# Patient Record
Sex: Female | Born: 1965 | Marital: Married | Smoking: Never smoker
Health system: Southern US, Community
[De-identification: ages and names within clinical notes are randomized; demographics above are authoritative.]

## PROBLEM LIST (undated history)

## (undated) DIAGNOSIS — E119 Type 2 diabetes mellitus without complications: Secondary | ICD-10-CM

## (undated) DIAGNOSIS — I639 Cerebral infarction, unspecified: Secondary | ICD-10-CM

## (undated) DIAGNOSIS — I10 Essential (primary) hypertension: Secondary | ICD-10-CM

## (undated) HISTORY — PX: NO PAST SURGERIES: SHX2092

## (undated) HISTORY — PX: APPENDECTOMY: SHX54

---

## 2014-01-27 ENCOUNTER — Emergency Department: Payer: Self-pay | Admitting: Emergency Medicine

## 2014-01-27 LAB — URINALYSIS, COMPLETE
Bilirubin,UR: NEGATIVE
Glucose,UR: >=500 mg/dL (ref 0–75)
Ketone: NEGATIVE
Nitrite: NEGATIVE
Ph: 6 (ref 4.5–8.0)
Protein: 30
RBC,UR: 54 /HPF (ref 0–5)
Specific Gravity: 1.033 (ref 1.003–1.030)
Squamous Epithelial: NONE SEEN
WBC UR: 576 /HPF (ref 0–5)

## 2014-01-27 LAB — CBC WITH DIFFERENTIAL/PLATELET
Basophil #: 0.1 10*3/uL (ref 0.0–0.1)
Basophil %: 0.6 %
Eosinophil #: 0.4 10*3/uL (ref 0.0–0.7)
Eosinophil %: 3.9 %
HCT: 39.3 % (ref 35.0–47.0)
HGB: 13.3 g/dL (ref 12.0–16.0)
Lymphocyte #: 3.8 10*3/uL — ABNORMAL HIGH (ref 1.0–3.6)
Lymphocyte %: 41.7 %
MCH: 30.1 pg (ref 26.0–34.0)
MCHC: 33.8 g/dL (ref 32.0–36.0)
MCV: 89 fL (ref 80–100)
Monocyte #: 0.9 x10 3/mm (ref 0.2–0.9)
Monocyte %: 9.6 %
Neutrophil #: 4.1 10*3/uL (ref 1.4–6.5)
Neutrophil %: 44.2 %
Platelet: 283 10*3/uL (ref 150–440)
RBC: 4.41 10*6/uL (ref 3.80–5.20)
RDW: 12.2 % (ref 11.5–14.5)
WBC: 9.2 10*3/uL (ref 3.6–11.0)

## 2014-01-27 LAB — COMPREHENSIVE METABOLIC PANEL
Albumin: 3.7 g/dL (ref 3.4–5.0)
Alkaline Phosphatase: 140 U/L — ABNORMAL HIGH
Anion Gap: 8 (ref 7–16)
BUN: 18 mg/dL (ref 7–18)
Bilirubin,Total: 0.2 mg/dL (ref 0.2–1.0)
Calcium, Total: 8.4 mg/dL — ABNORMAL LOW (ref 8.5–10.1)
Chloride: 102 mmol/L (ref 98–107)
Co2: 27 mmol/L (ref 21–32)
Creatinine: 0.93 mg/dL (ref 0.60–1.30)
EGFR (African American): >60
EGFR (Non-African Amer.): >60
Glucose: 401 mg/dL — ABNORMAL HIGH (ref 65–99)
Osmolality: 293 (ref 275–301)
Potassium: 4.1 mmol/L (ref 3.5–5.1)
SGOT(AST): 14 U/L — ABNORMAL LOW (ref 15–37)
SGPT (ALT): 27 U/L
Sodium: 137 mmol/L (ref 136–145)
Total Protein: 8.1 g/dL (ref 6.4–8.2)

## 2020-05-18 ENCOUNTER — Other Ambulatory Visit: Payer: Self-pay | Admitting: Internal Medicine

## 2020-05-18 DIAGNOSIS — Z1231 Encounter for screening mammogram for malignant neoplasm of breast: Secondary | ICD-10-CM

## 2020-05-31 ENCOUNTER — Inpatient Hospital Stay: Admission: RE | Admit: 2020-05-31 | Payer: Self-pay | Source: Ambulatory Visit

## 2020-06-15 ENCOUNTER — Observation Stay
Admission: EM | Admit: 2020-06-15 | Discharge: 2020-06-17 | Disposition: A | Discharge status: Home / Self Care | Payer: Medicaid Other | Attending: Internal Medicine | Admitting: Internal Medicine

## 2020-06-15 ENCOUNTER — Observation Stay: Payer: Medicaid Other

## 2020-06-15 ENCOUNTER — Emergency Department: Payer: Medicaid Other

## 2020-06-15 ENCOUNTER — Other Ambulatory Visit: Payer: Self-pay

## 2020-06-15 ENCOUNTER — Encounter: Payer: Self-pay | Admitting: Emergency Medicine

## 2020-06-15 DIAGNOSIS — I152 Hypertension secondary to endocrine disorders: Secondary | ICD-10-CM | POA: Diagnosis present

## 2020-06-15 DIAGNOSIS — I10 Essential (primary) hypertension: Secondary | ICD-10-CM | POA: Diagnosis present

## 2020-06-15 DIAGNOSIS — E119 Type 2 diabetes mellitus without complications: Secondary | ICD-10-CM | POA: Diagnosis not present

## 2020-06-15 DIAGNOSIS — E1159 Type 2 diabetes mellitus with other circulatory complications: Secondary | ICD-10-CM | POA: Diagnosis present

## 2020-06-15 DIAGNOSIS — I639 Cerebral infarction, unspecified: Secondary | ICD-10-CM | POA: Diagnosis not present

## 2020-06-15 DIAGNOSIS — U071 COVID-19: Secondary | ICD-10-CM | POA: Diagnosis not present

## 2020-06-15 DIAGNOSIS — R200 Anesthesia of skin: Secondary | ICD-10-CM | POA: Diagnosis present

## 2020-06-15 HISTORY — DX: Type 2 diabetes mellitus without complications: E11.9

## 2020-06-15 HISTORY — DX: Essential (primary) hypertension: I10

## 2020-06-15 LAB — CBC
HCT: 34.5 % — ABNORMAL LOW (ref 36.0–46.0)
Hemoglobin: 11.4 g/dL — ABNORMAL LOW (ref 12.0–15.0)
MCH: 28.8 pg (ref 26.0–34.0)
MCHC: 33 g/dL (ref 30.0–36.0)
MCV: 87.1 fL (ref 80.0–100.0)
Platelets: 296 10*3/uL (ref 150–400)
RBC: 3.96 MIL/uL (ref 3.87–5.11)
RDW: 11.9 % (ref 11.5–15.5)
WBC: 9.8 10*3/uL (ref 4.0–10.5)
nRBC: 0 % (ref 0.0–0.2)

## 2020-06-15 LAB — DIFFERENTIAL
Abs Immature Granulocytes: 0.03 10*3/uL (ref 0.00–0.07)
Basophils Absolute: 0 10*3/uL (ref 0.0–0.1)
Basophils Relative: 0 %
Eosinophils Absolute: 0.2 10*3/uL (ref 0.0–0.5)
Eosinophils Relative: 2 %
Immature Granulocytes: 0 %
Lymphocytes Relative: 35 %
Lymphs Abs: 3.4 10*3/uL (ref 0.7–4.0)
Monocytes Absolute: 0.8 10*3/uL (ref 0.1–1.0)
Monocytes Relative: 8 %
Neutro Abs: 5.4 10*3/uL (ref 1.7–7.7)
Neutrophils Relative %: 55 %

## 2020-06-15 LAB — TROPONIN I (HIGH SENSITIVITY): Troponin I (High Sensitivity): <2 ng/L (ref <18)

## 2020-06-15 LAB — COMPREHENSIVE METABOLIC PANEL
ALT: 31 U/L (ref 0–44)
AST: 23 U/L (ref 15–41)
Albumin: 3.7 g/dL (ref 3.5–5.0)
Alkaline Phosphatase: 119 U/L (ref 38–126)
Anion gap: 8 (ref 5–15)
BUN: 28 mg/dL — ABNORMAL HIGH (ref 6–20)
CO2: 26 mmol/L (ref 22–32)
Calcium: 9.1 mg/dL (ref 8.9–10.3)
Chloride: 104 mmol/L (ref 98–111)
Creatinine, Ser: 0.68 mg/dL (ref 0.44–1.00)
GFR, Estimated: >60 mL/min (ref >60)
Glucose, Bld: 193 mg/dL — ABNORMAL HIGH (ref 70–99)
Potassium: 4.2 mmol/L (ref 3.5–5.1)
Sodium: 138 mmol/L (ref 135–145)
Total Bilirubin: 0.4 mg/dL (ref 0.3–1.2)
Total Protein: 8.4 g/dL — ABNORMAL HIGH (ref 6.5–8.1)

## 2020-06-15 LAB — RESP PANEL BY RT-PCR (FLU A&B, COVID) ARPGX2
Influenza A by PCR: NEGATIVE
Influenza B by PCR: NEGATIVE
SARS Coronavirus 2 by RT PCR: POSITIVE — AB

## 2020-06-15 LAB — CBG MONITORING, ED
Glucose-Capillary: 175 mg/dL — ABNORMAL HIGH (ref 70–99)
Glucose-Capillary: 197 mg/dL — ABNORMAL HIGH (ref 70–99)

## 2020-06-15 LAB — PROTIME-INR
INR: 1 (ref 0.8–1.2)
Prothrombin Time: 13.6 seconds (ref 11.4–15.2)

## 2020-06-15 LAB — APTT: aPTT: 29 seconds (ref 24–36)

## 2020-06-15 IMAGING — CT CT HEAD W/O CM
3 series · 15 of 46 positions shown, 18 images · non-contrast
Comparison: No pertinent prior exams available for comparison.

CLINICAL DATA: Provided history: Neuro deficit, acute, stroke
suspected. Additional history provided: Left-sided numbness which
began last night.

EXAM:
CT HEAD WITHOUT CONTRAST
TECHNIQUE: Contiguous axial images were obtained from the base of the skull
through the vertex without intravenous contrast.

[Series 3: head wo · axial · 0.42mm/px · z∈[-130,-10]mm · 9 of 29 slices shown, 12 images]
[im 3/29  brain]
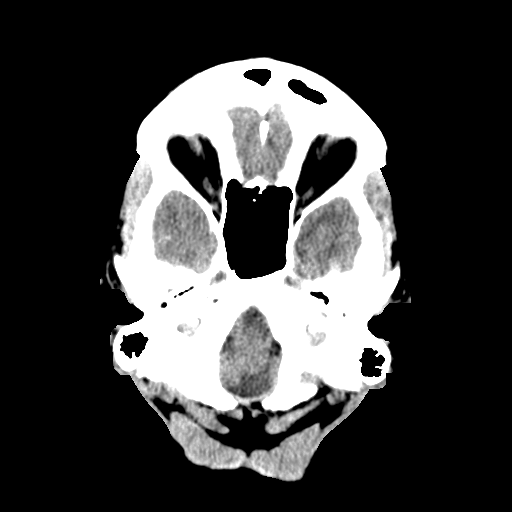
[im 3/29  bone]
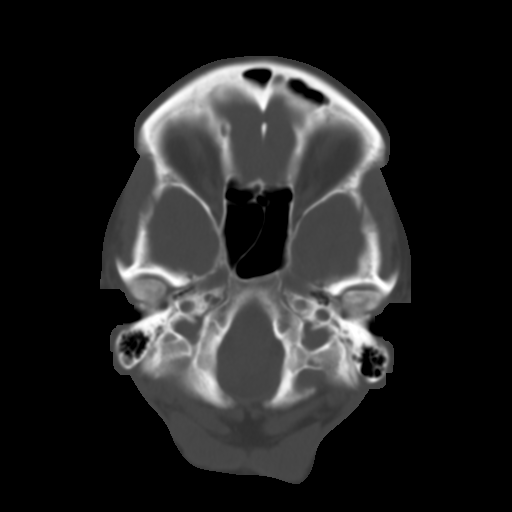
[im 6/29  brain]
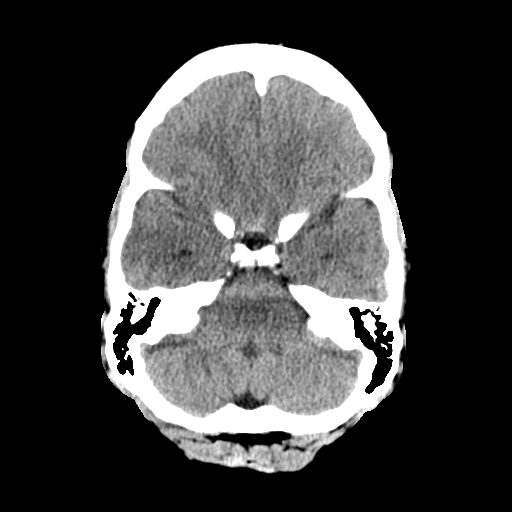
[im 9/29  brain]
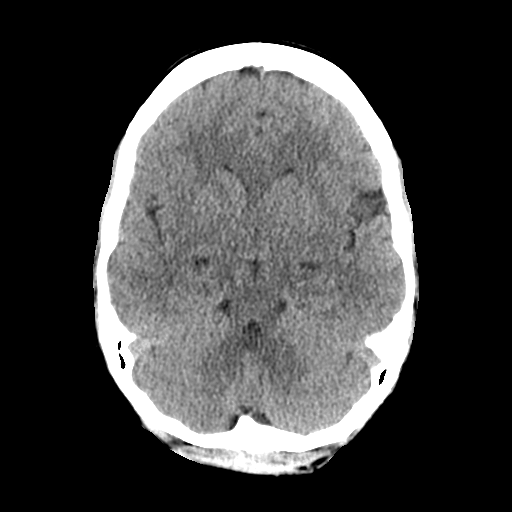
[im 12/29  brain]
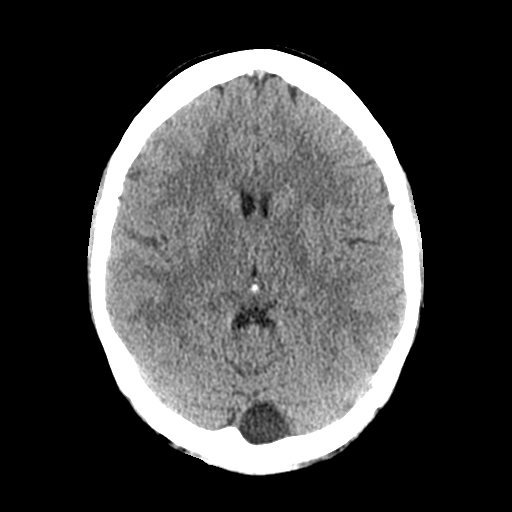
[im 15/29  brain]
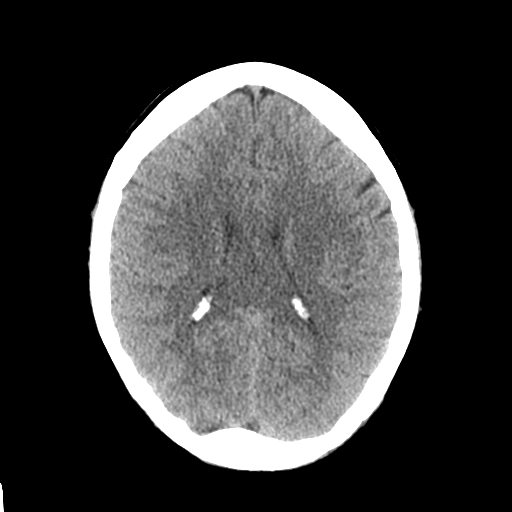
[im 15/29  bone]
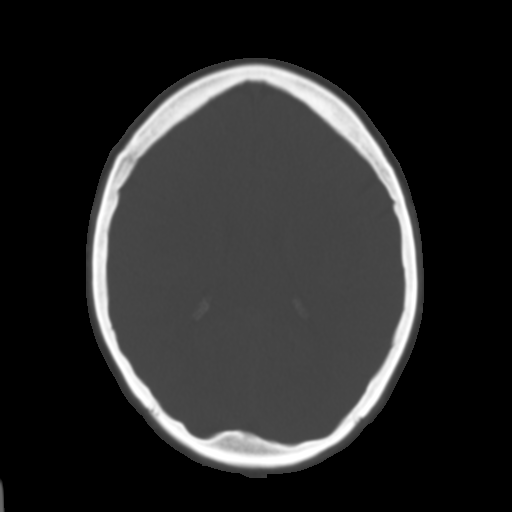
[im 18/29  brain]
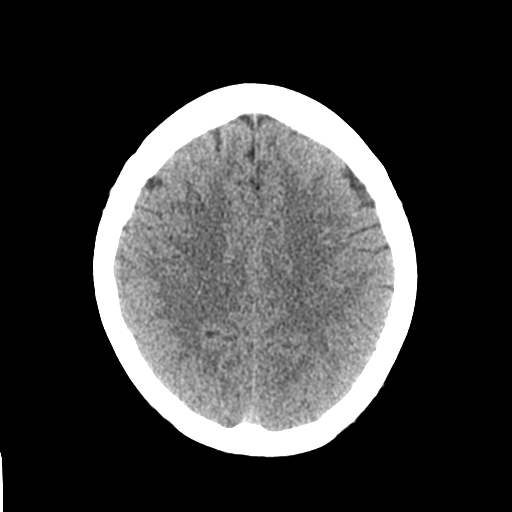
[im 21/29  brain]
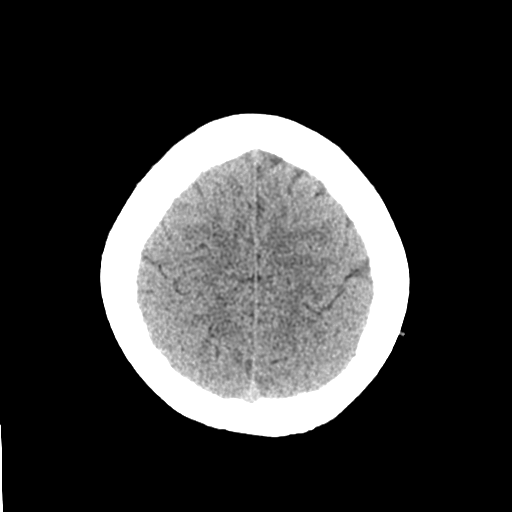
[im 24/29  brain]
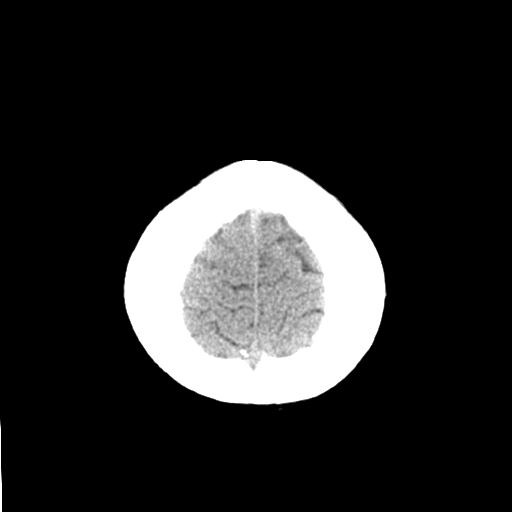
[im 27/29  brain]
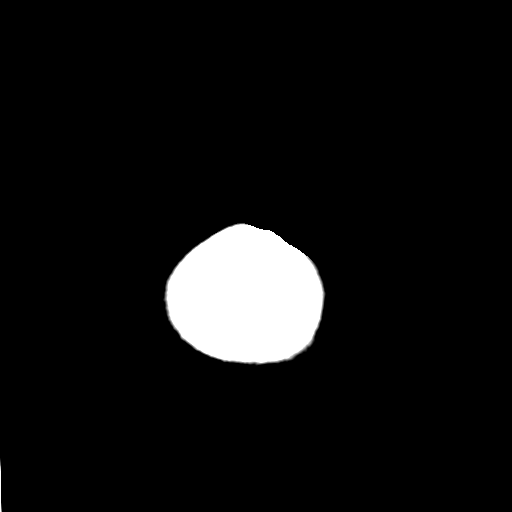
[im 27/29  bone]
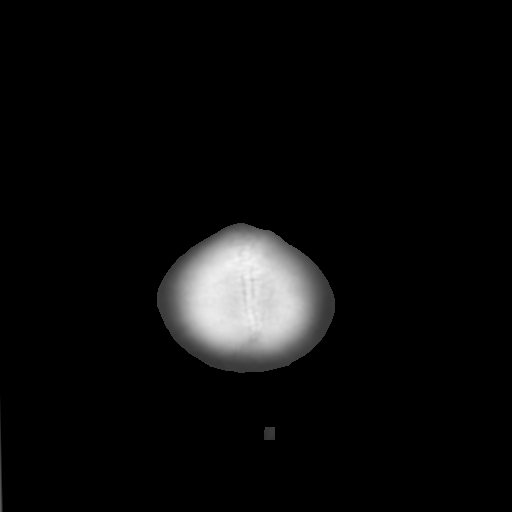

[Series 4: coronal soft tissue · coronal · 0.34mm/px · 3 of 63 slices shown]
[im 22/63  brain]
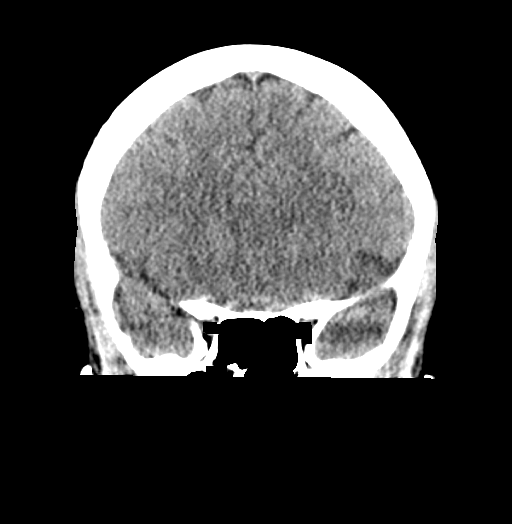
[im 28/63  brain]
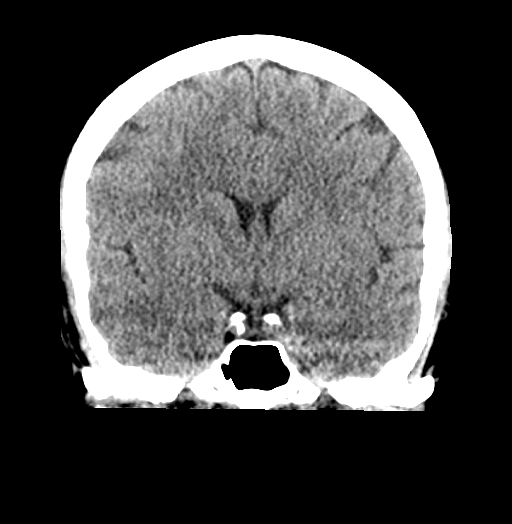
[im 35/63  brain]
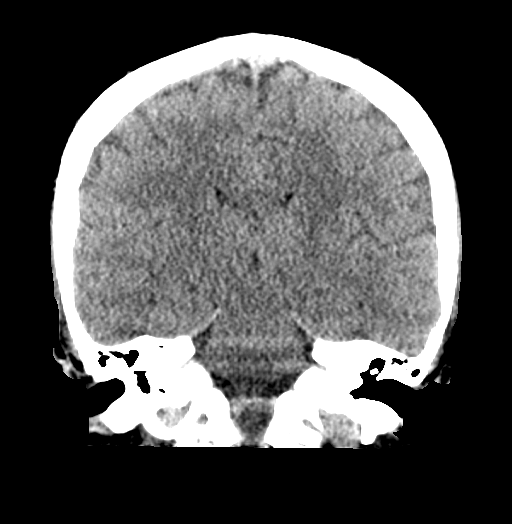

[Series 5: sagittal soft tissue · sagittal · 0.35mm/px · 3 of 53 slices shown]
[im 18/53  brain]
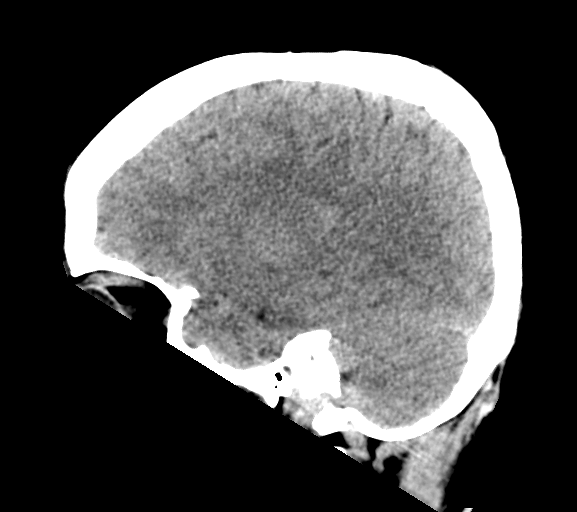
[im 27/53  brain]
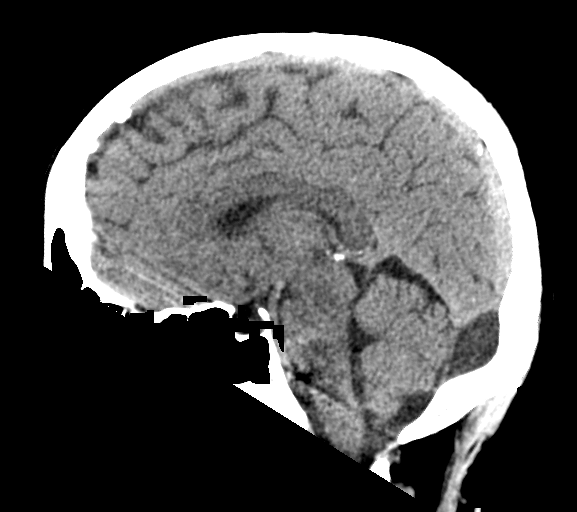
[im 35/53  brain]
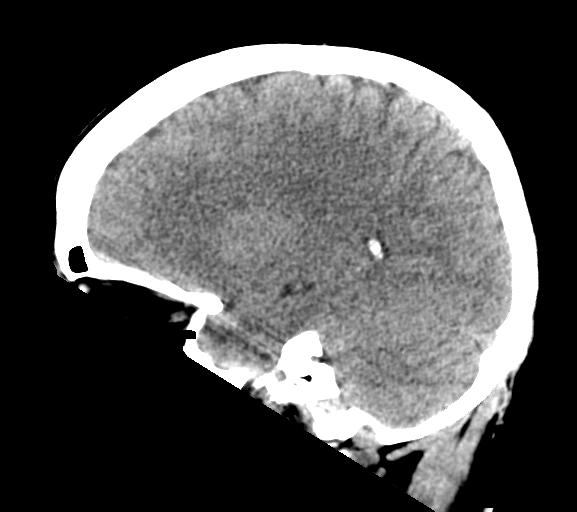

[15 of 46 positions shown; findings below may reference images not displayed]

FINDINGS: Brain:

Cerebral volume is normal.

6 mm focal hypodensity within the right frontal lobe centrum
semiovale (series 3, image 19) (series 5, image 22).

Retrocerebellar CSF density prominence just to the left of midline
measuring 2.1 x 2.4 cm in transaxial dimensions (series 3, image 13)
(series 5, image 28). This likely reflects an incidental arachnoid
cyst.

There is no acute intracranial hemorrhage.

No demarcated cortical infarct.

No extra-axial fluid collection.

No evidence of intracranial mass.

No midline shift.

Partially empty sella turcica.

Vascular: No hyperdense vessel.  Atherosclerotic calcifications.

Skull: Normal. Negative for fracture or focal lesion.

Sinuses/Orbits: Visualized orbits show no acute finding. Moderate
sized fluid level within the left frontal sinus. Scattered mild
mucosal thickening and possible fluid within the bilateral ethmoid
air cells.
IMPRESSION: 6 mm hypodensity within the right frontal lobe white matter, which
may reflect an age-indeterminate lacunar infarct or prominent
perivascular space. Given the provided history of left-sided
weakness, consider a noncontrast brain MRI for further evaluation.

Probable incidental left retrocerebellar arachnoid cyst.

Partially empty sella turcica. This finding is very commonly
incidental, but can be associated with idiopathic intracranial
hypertension.

Otherwise unremarkable non-contrast CT appearance of the brain.

Paranasal sinus disease, as described.

## 2020-06-15 IMAGING — MR MR MRA NECK WO/W CM
1 of 2 series · 20 of 48 positions shown · IV contrast (7ml Gadavist)
Comparison: Prior brain MRI from earlier the same day.

CLINICAL DATA: Initial evaluation for acute stroke.

EXAM:
MRA NECK WITHOUT AND WITH CONTRAST
MRA HEAD WITHOUT CONTRAST
TECHNIQUE: Multiplanar and multiecho pulse sequences of the neck were obtained
without and with intravenous contrast. Angiographic images of the
neck were obtained using MRA technique without and with intravenous
contrast; Angiographic images of the Circle of Willis were obtained
using MRA technique without intravenous contrast.
CONTRAST:  7mL GADAVIST GADOBUTROL 1 MMOL/ML IV SOLN

[Series 15: angio_[id number]_post_ttc=2.0s_moco-adv_sub · coronal · B · 0.9mm · 0.85mm/px · 20 of 96 slices shown]
[im 1/96]
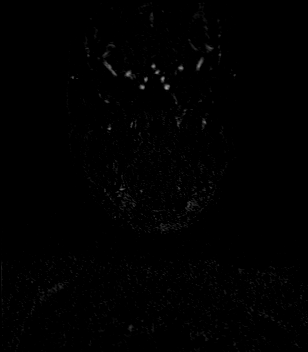
[im 6/96]
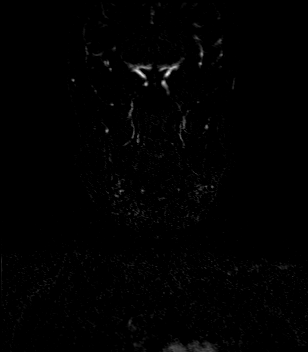
[im 11/96]
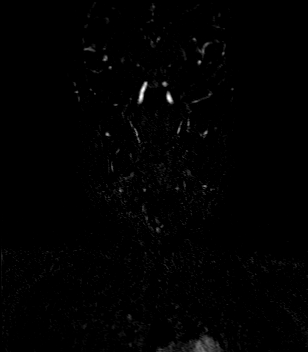
[im 16/96]
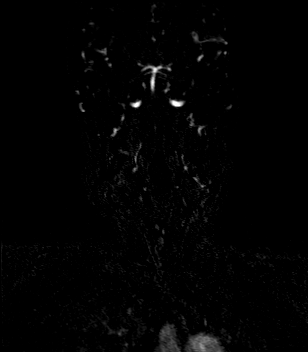
[im 21/96]
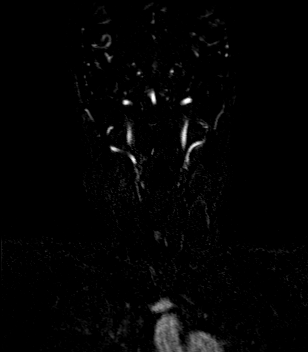
[im 26/96]
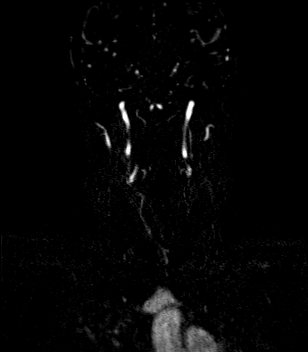
[im 31/96]
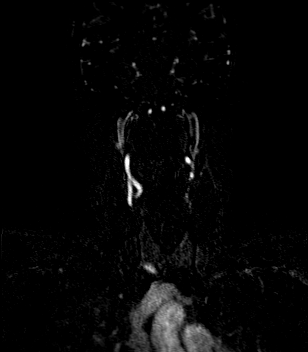
[im 36/96]
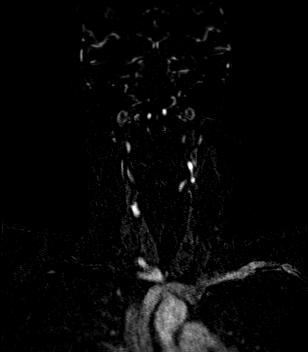
[im 41/96]
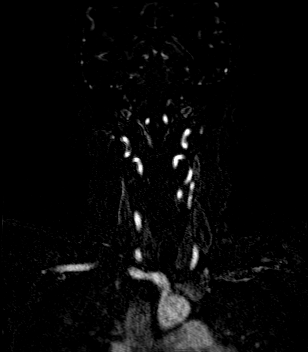
[im 46/96]
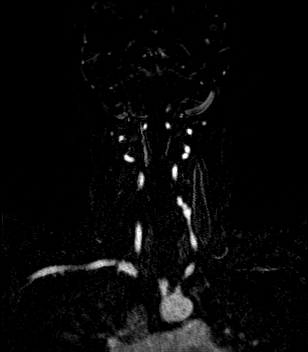
[im 51/96]
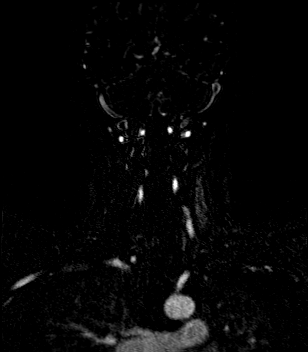
[im 56/96]
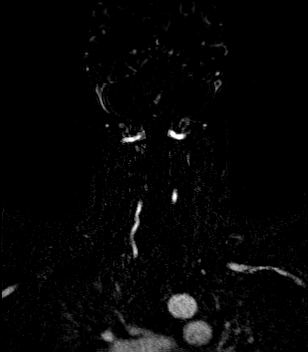
[im 61/96]
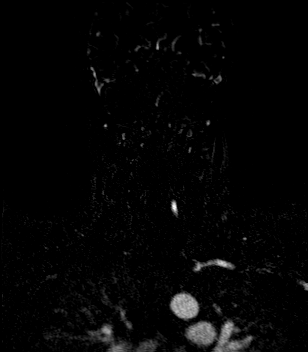
[im 66/96]
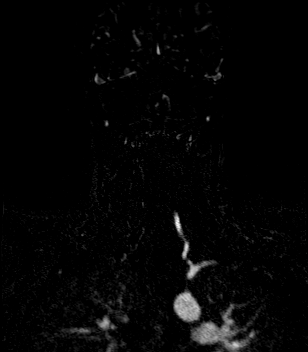
[im 71/96]
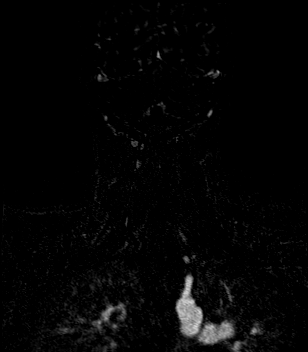
[im 76/96]
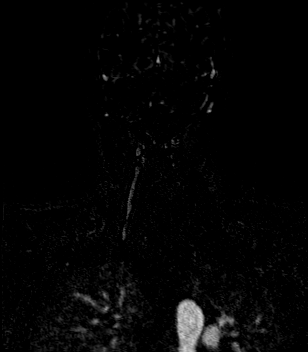
[im 81/96]
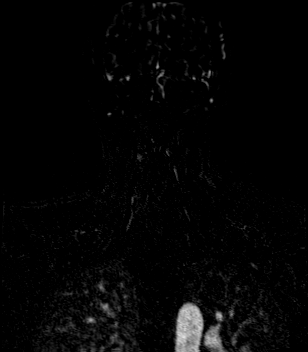
[im 86/96]
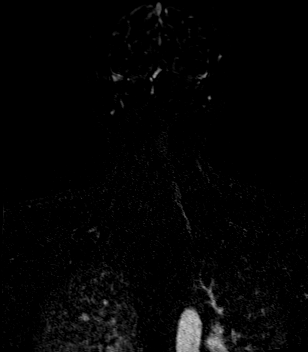
[im 91/96]
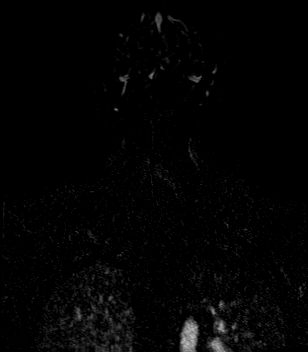
[im 96/96]
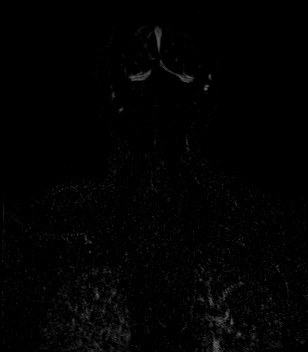

[20 of 48 positions shown; findings below may reference images not displayed]

FINDINGS: MRA NECK FINDINGS

AORTIC ARCH: Visualized aortic arch normal caliber with normal 3
vessel morphology. No hemodynamically significant stenosis seen
about the origin of the great vessels.

RIGHT CAROTID SYSTEM: Right CCA patent from its origin to the
bifurcation without stenosis. There is a severe 70-80% stenosis
involving the proximal right ICA, just distal to the bifurcation
(series [LE], image 7). Area of involvement essentially begins at
the bifurcation, and measures approximately 1 cm in length.
Distally, the right ICA is patent without stenosis, evidence for
dissection, or occlusion.

LEFT CAROTID SYSTEM: Left CCA patent from its origin to the
bifurcation without stenosis. Mild atheromatous irregularity about
the left carotid bulb/proximal left ICA without hemodynamically
significant stenosis. Left ICA patent distally without stenosis,
evidence for dissection or occlusion.

VERTEBRAL ARTERIES: Both vertebral arteries arise from the
subclavian arteries. Short-segment moderate approximate 50% stenosis
at the origin of the right vertebral artery (series [LE], image 9).
Vertebral arteries otherwise patent within the neck without
stenosis, evidence for dissection, or occlusion.

MRA HEAD FINDINGS

ANTERIOR CIRCULATION:

Examination degraded by motion artifact.

Petrous segments patent bilaterally. Diffuse atheromatous
irregularity with narrowing throughout the carotid siphons.
Associated moderate to severe stenosis at the anterior genu of the
cavernous right ICA (series [LE], image 3). Additional multifocal
severe stenoses noted about the cavernous and para clinoid left ICA
(series [LE], image 15). ICA termini well perfused. A1 segments
patent. Normal anterior communicating artery complex. Anterior
cerebral arteries patent to their distal aspects without appreciable
stenosis. No M1 stenosis or occlusion. Normal MCA bifurcations.
Distal MCA branches perfused and fairly symmetric.

POSTERIOR CIRCULATION:

Both V4 segments patent to the vertebrobasilar junction without
stenosis. Left vertebral artery dominant. Left PICA origin patent
and normal. Right PICA not seen. Irregularity within the proximal
and mid basilar artery could be related to atheromatous disease
versus artifact. Similarly, an apparent short-segment moderate
stenosis at the distal basilar artery could be related to artifact
or atherosclerotic disease (sees series [LE], image 10). Superior
cerebellar arteries patent proximally. Both PCAs primarily supplied
via the basilar. Short-segment moderate stenosis at the left P1/P2
junction (series [LE], image 10). PCAs otherwise well perfused to
their distal aspects without stenosis.

No intracranial aneurysm.
IMPRESSION: MRA HEAD IMPRESSION:

1. Motion degraded exam.
2. Atheromatous irregularity throughout the carotid siphons with
associated moderate to severe stenoses, left worse than right.
3. Short-segment moderate stenosis at the left P1/P2 junction.
4. Atheromatous irregularity versus artifact and possible
short-segment moderate stenosis at the distal basilar artery as
above.

MRA NECK IMPRESSION:

1. Severe 70-80% stenosis involving the proximal right ICA, just
distal to the bifurcation. Finding is likely the source of the acute
watershed infarcts seen on prior brain MRI. Both carotid artery
systems otherwise widely patent within the neck.
2. Short-segment moderate approximate 50% stenosis at the origin of
the right vertebral artery. Vertebral arteries otherwise patent
within the neck

## 2020-06-15 IMAGING — MR MR HEAD W/O CM
11 series · 45 of 48 positions shown · non-contrast
Comparison: No pertinent prior exams available for comparison.

CLINICAL DATA: Brain mass or lesion.

EXAM:
MRI HEAD WITHOUT CONTRAST
TECHNIQUE: Multiplanar, multiecho pulse sequences of the brain and surrounding
structures were obtained without intravenous contrast.

[Series 5: ax dwi_tracew · axial · 3.0mm · 0.65mm/px · z∈[-125,+26]mm · 4 of 48 slices shown]
[im 1/48]
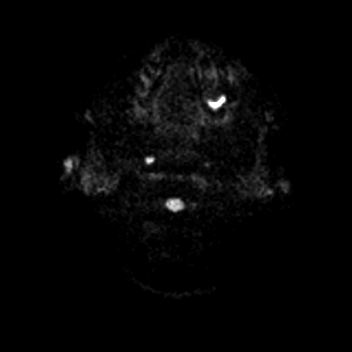
[im 16/48]
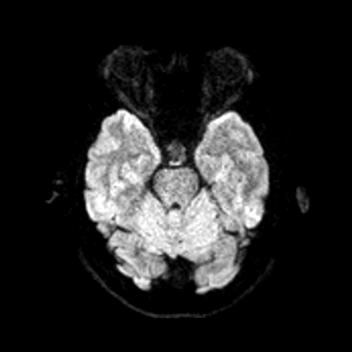
[im 32/48]
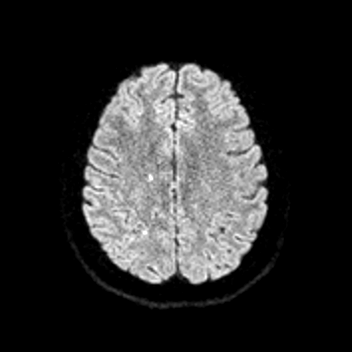
[im 48/48]
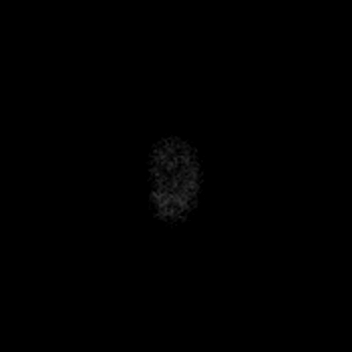

[Series 6: ax dwi_adc · axial · 3.0mm · 0.65mm/px · z∈[-125,+26]mm · 4 of 48 slices shown]
[im 1/48]
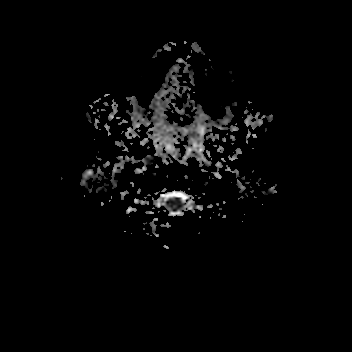
[im 16/48]
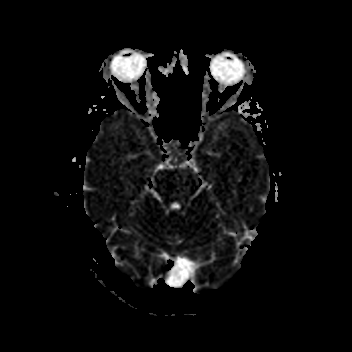
[im 32/48]
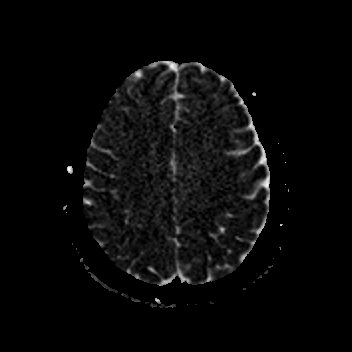
[im 48/48]
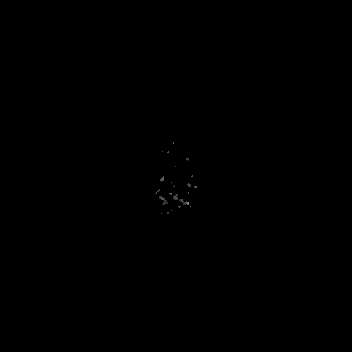

[Series 7: cor dwi_tracew · coronal · 5.0mm · 0.65mm/px · 3 of 40 slices shown]
[im 1/40]
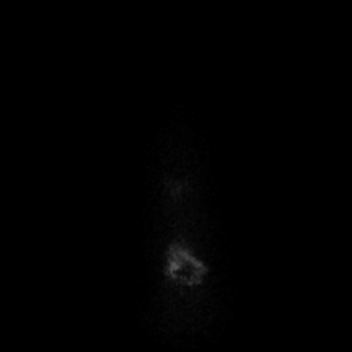
[im 20/40]
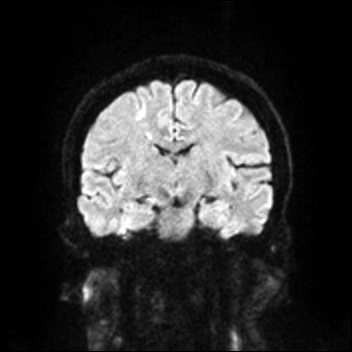
[im 40/40]
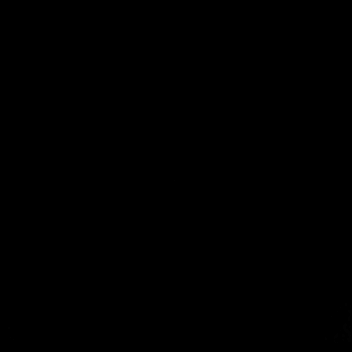

[Series 8: cor dwi_adc · coronal · 5.0mm · 0.65mm/px · 3 of 36 slices shown]
[im 1/36]
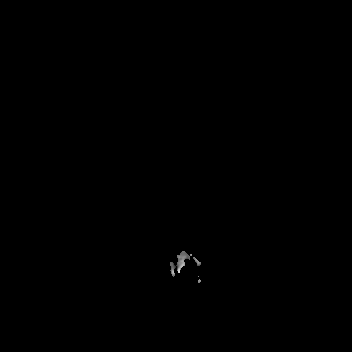
[im 18/36]
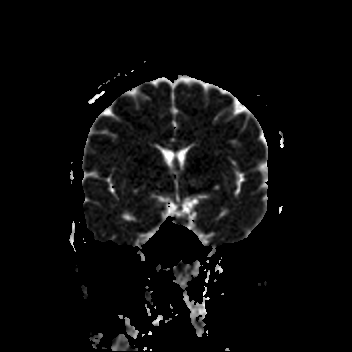
[im 36/36]
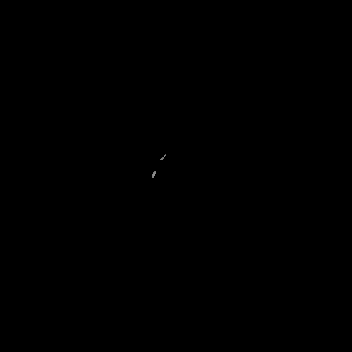

[Series 9: T1 · sagittal · 5.0mm · 0.62mm/px · 2 of 25 slices shown (1 of 2)]
[im 1/25]
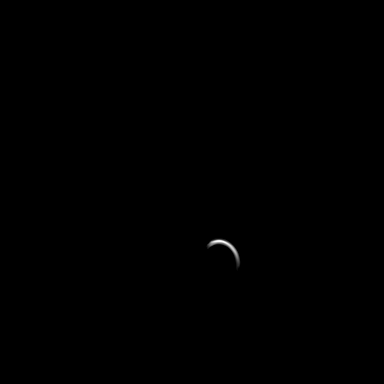
[im 25/25]
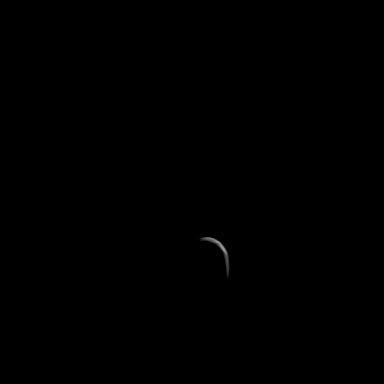

[Series 10: T2 · axial · 5.0mm · 0.53mm/px · z∈[-119,+21]mm · 2 of 25 slices shown (1 of 2)]
[im 1/25]
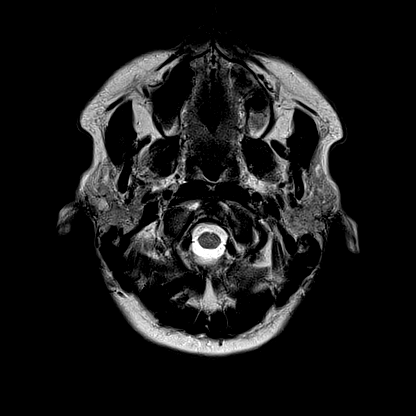
[im 25/25]
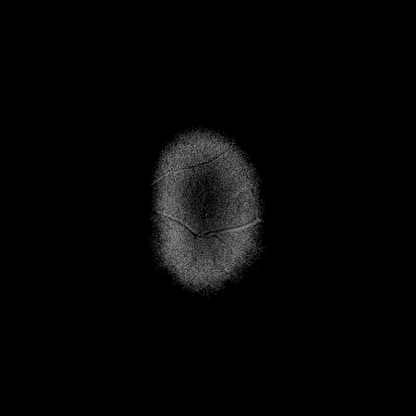

[Series 12: pha_images · axial · 3.0mm · 0.90mm/px · z∈[-133,+36]mm · 5 of 58 slices shown]
[im 1/58]
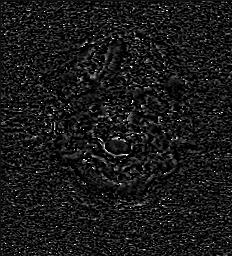
[im 15/58]
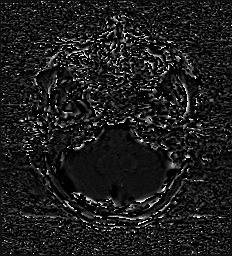
[im 29/58]
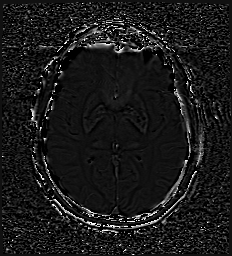
[im 43/58]
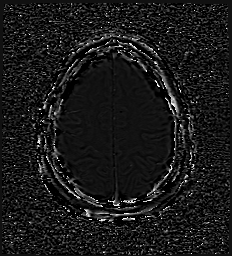
[im 58/58]
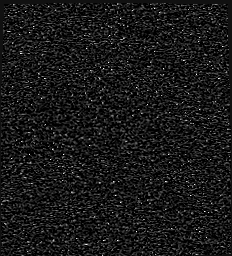

[Series 13: swi_images · axial · 3.0mm · 0.90mm/px · z∈[-136,+36]mm · 5 of 60 slices shown]
[im 1/60]
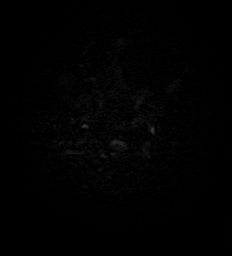
[im 15/60]
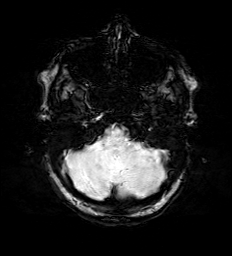
[im 30/60]
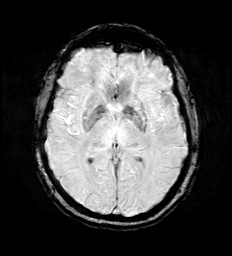
[im 45/60]
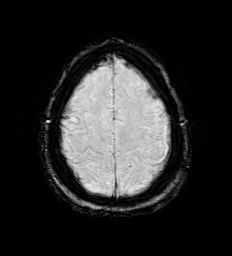
[im 60/60]
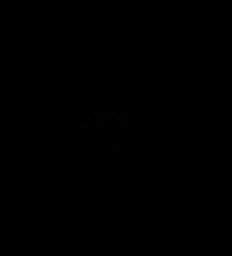

[Series 15: FLAIR · axial · 3.0mm · 0.53mm/px · z∈[-127,+30]mm · 4 of 55 slices shown]
[im 1/55]
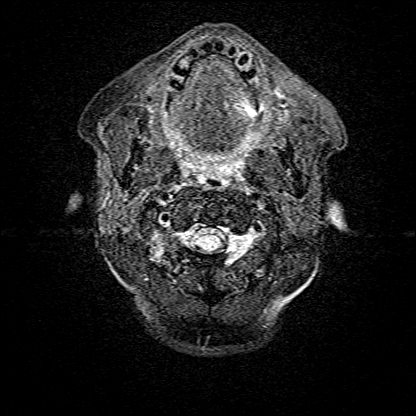
[im 19/55]
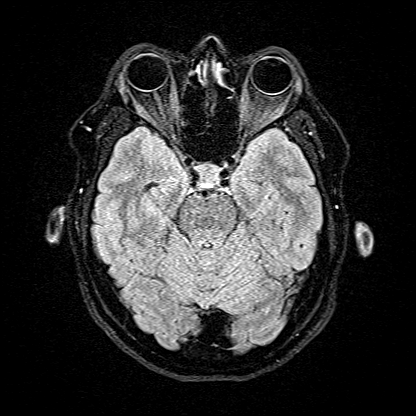
[im 37/55]
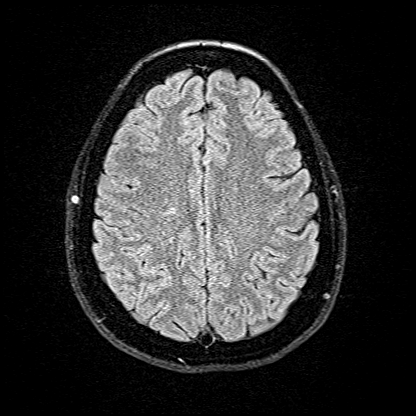
[im 55/55]
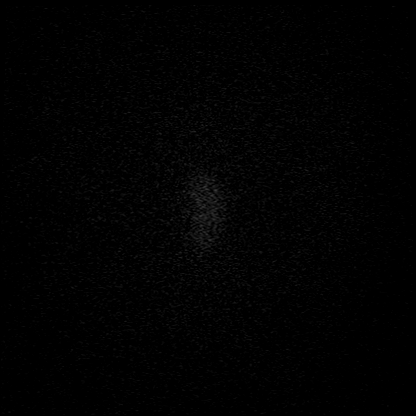

[Series 16: T1 · axial · 1.0mm · 0.98mm/px · z∈[-137,+34]mm · 11 of 176 slices shown (2 of 2)]
[im 1/176]
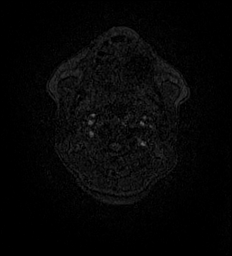
[im 14/176]
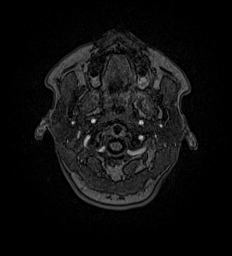
[im 27/176]
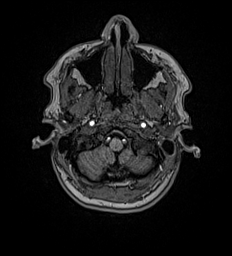
[im 41/176]
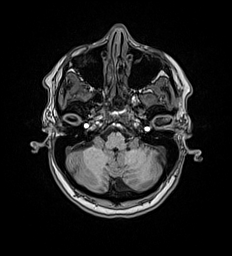
[im 54/176]
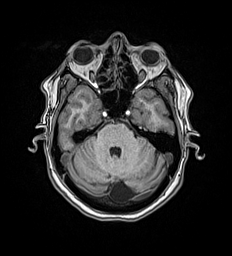
[im 68/176]
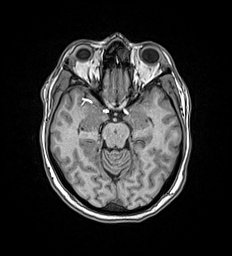
[im 81/176]
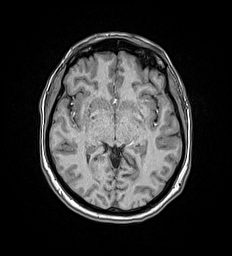
[im 95/176]
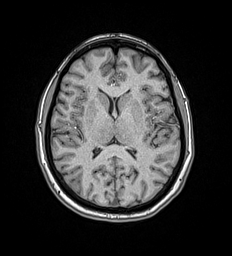
[im 122/176]
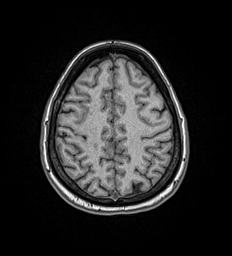
[im 149/176]
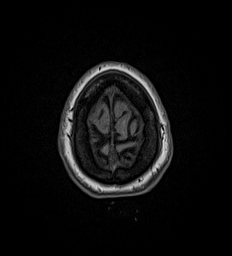
[im 176/176]
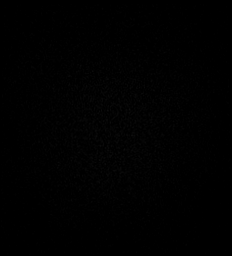

[Series 17: T2 · coronal · 5.0mm · 0.57mm/px · 2 of 29 slices shown (2 of 2)]
[im 1/29]
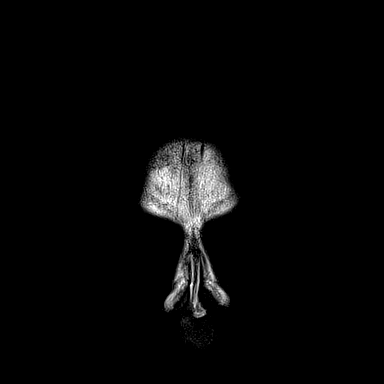
[im 29/29]
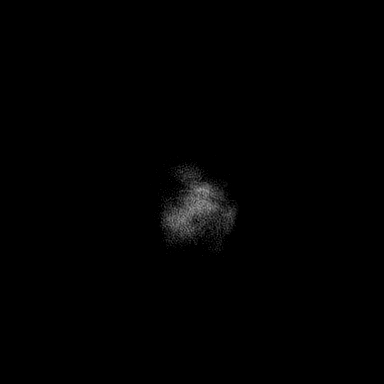

[45 of 48 positions shown; findings below may reference images not displayed]

FINDINGS: Brain:

Cerebral volume is normal.

There are patchy small acute cortical/subcortical infarcts within
the right MCA territory, as well as right MCA/ACA and MCA/PCA
watershed territories, within the right frontal and parietal lobes.

No evidence of intracranial mass.

No chronic intracranial blood products.

No extra-axial fluid collection.

No midline shift.

CSF intensity prominence posterior to the cerebellum just to the
left of midline measuring 2.1 x 2.5 cm in transaxial dimensions,
likely reflecting an incidental arachnoid cyst.

Vascular: Expected proximal arterial flow voids.

Skull and upper cervical spine: No focal marrow lesion.

Sinuses/Orbits: Visualized orbits show no acute finding. Mild left
greater than right frontal and bilateral ethmoid sinus mucosal
thickening. Trace mucosal thickening is per also present within the
bilateral sphenoid sinuses.

Other: 7 mm round T2/FLAIR hyperintense focus within the right
parotid gland (series 15, image 3).
IMPRESSION: Patchy small acute cortical/subcortical infarcts within the right
MCA territory, as well as right MCA/ACA and MCA/PCA watershed
territories. These infarcts are present within the right frontal and
parietal lobes. Consider MR or CT angiography of the head/neck for
further evaluation.

Incidentally noted left retrocerebellar arachnoid cyst.

Incidentally noted 7 mm round T2/FLAIR hyperintense focus within the
right parotid gland. This could reflect a prominent intraparotid
lymph node. However, a primary parotid neoplasm cannot be excluded.
Nonemergent targeted ultrasound recommended for further
characterization.

Paranasal sinus disease, as described.

## 2020-06-15 IMAGING — MR MR MRA HEAD W/O CM
1 series · 17 of 48 positions shown · IV contrast (gadavist)
Comparison: Prior brain MRI from earlier the same day.

CLINICAL DATA: Initial evaluation for acute stroke.

EXAM:
MRA NECK WITHOUT AND WITH CONTRAST
MRA HEAD WITHOUT CONTRAST
TECHNIQUE: Multiplanar and multiecho pulse sequences of the neck were obtained
without and with intravenous contrast. Angiographic images of the
neck were obtained using MRA technique without and with intravenous
contrast; Angiographic images of the Circle of Willis were obtained
using MRA technique without intravenous contrast.
CONTRAST:  7mL GADAVIST GADOBUTROL 1 MMOL/ML IV SOLN

[Series 5: TOF · axial · 0.5mm · 0.41mm/px · z∈[-114,-19]mm · 17 of 205 slices shown]
[im 1/205]
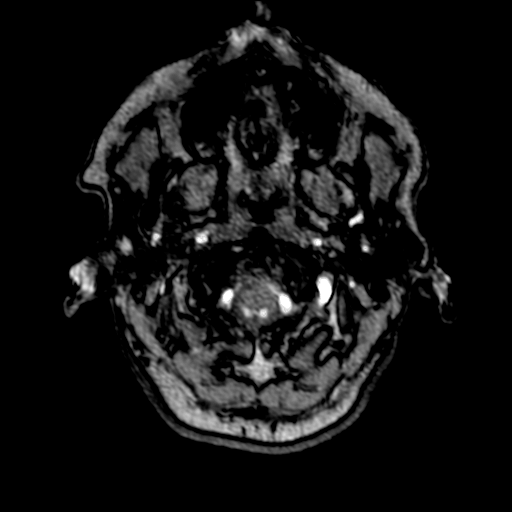
[im 5/205]
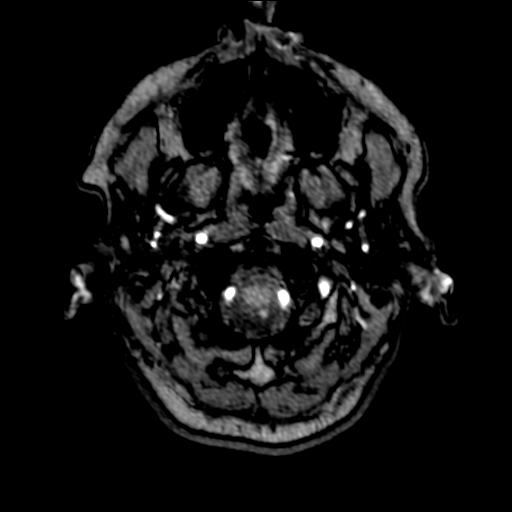
[im 9/205]
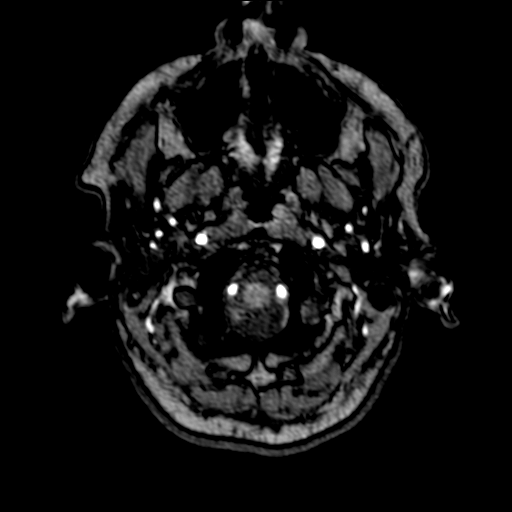
[im 14/205]
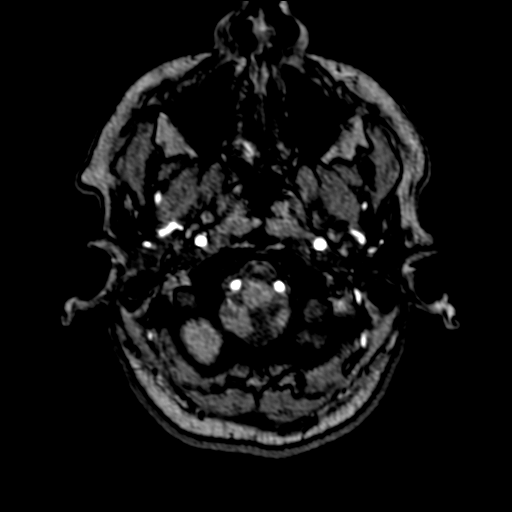
[im 18/205]
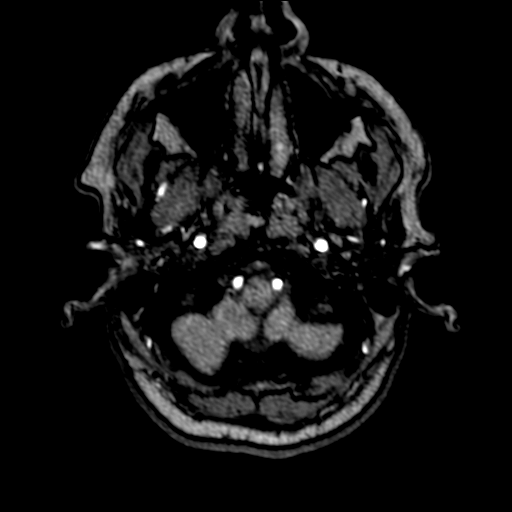
[im 22/205]
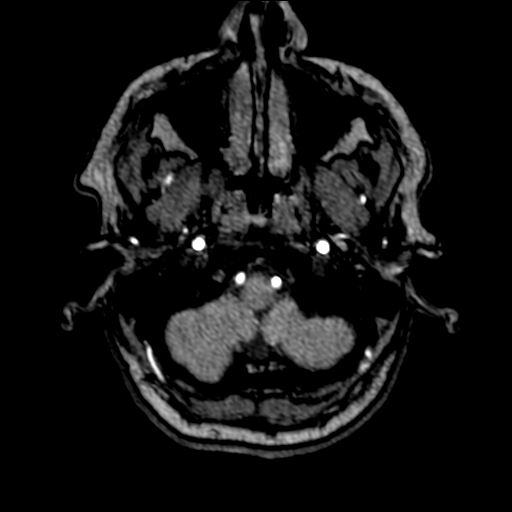
[im 27/205]
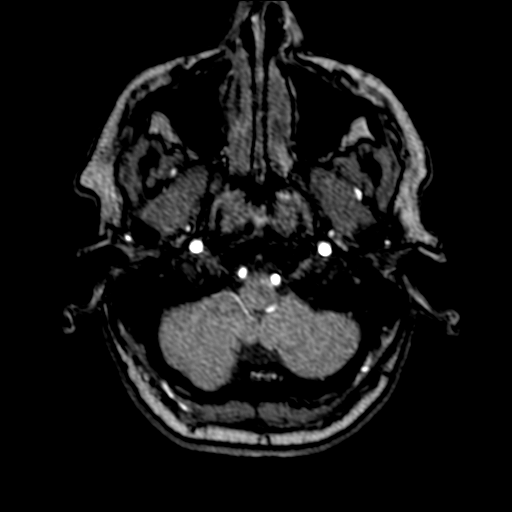
[im 35/205]
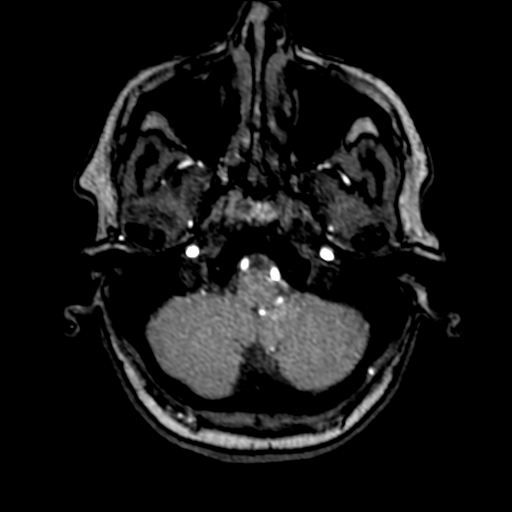
[im 40/205]
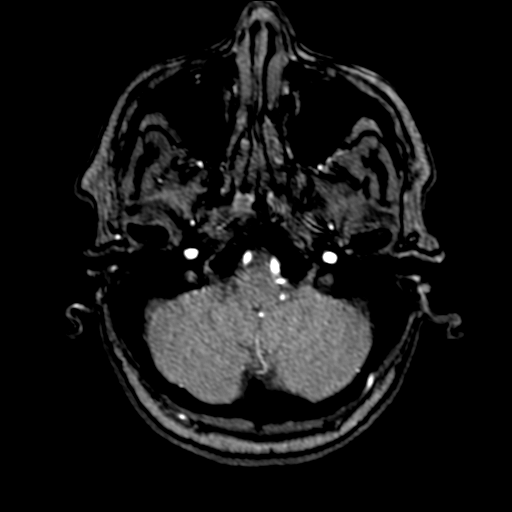
[im 66/205]
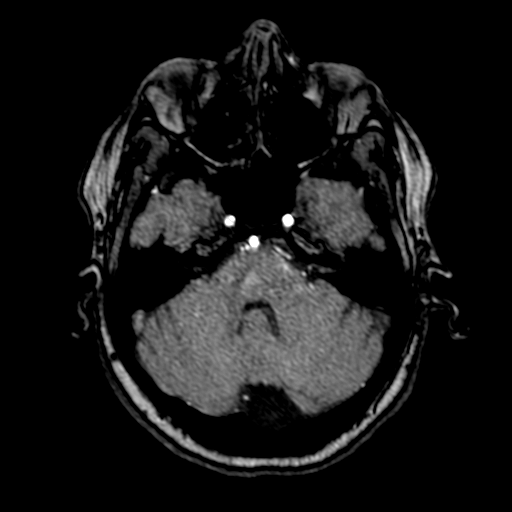
[im 92/205]
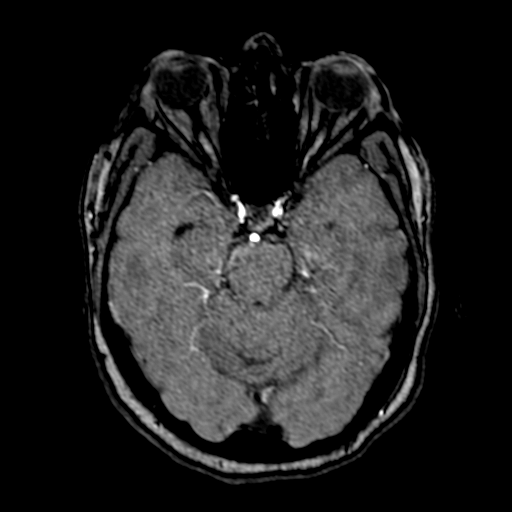
[im 105/205]
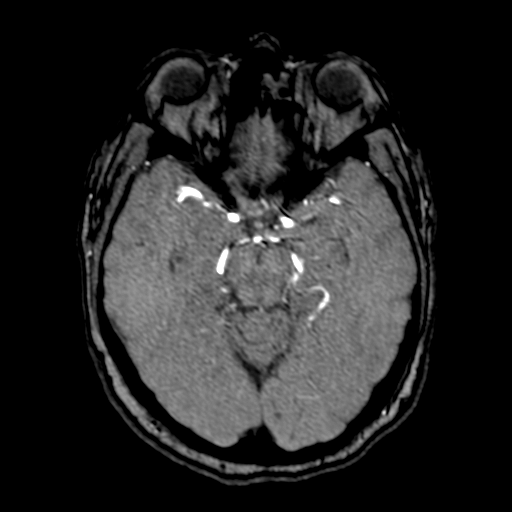
[im 118/205]
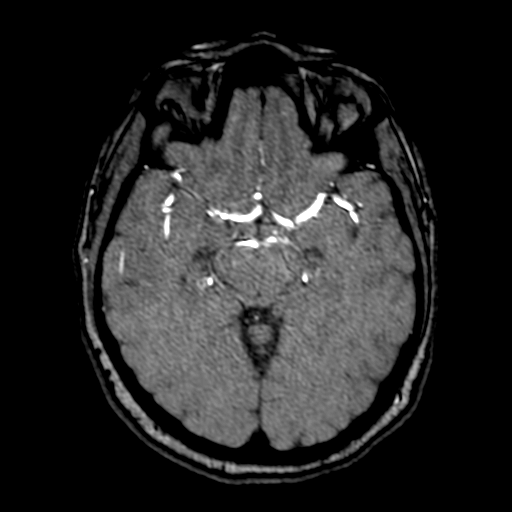
[im 144/205]
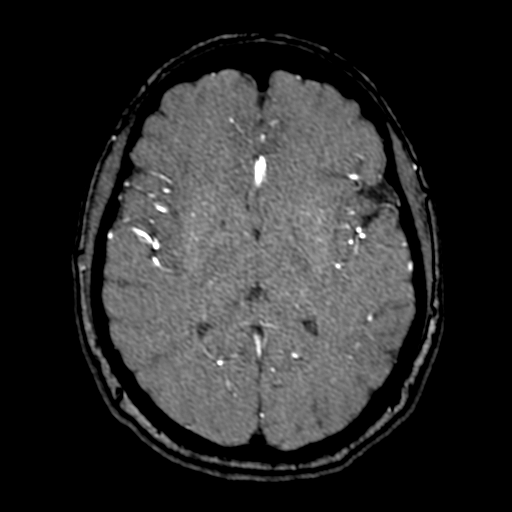
[im 170/205]
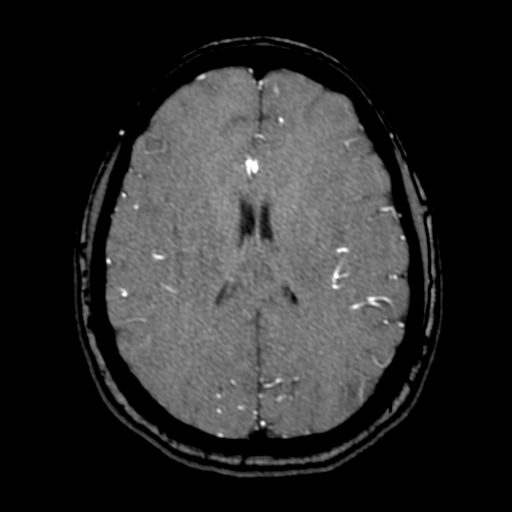
[im 174/205]
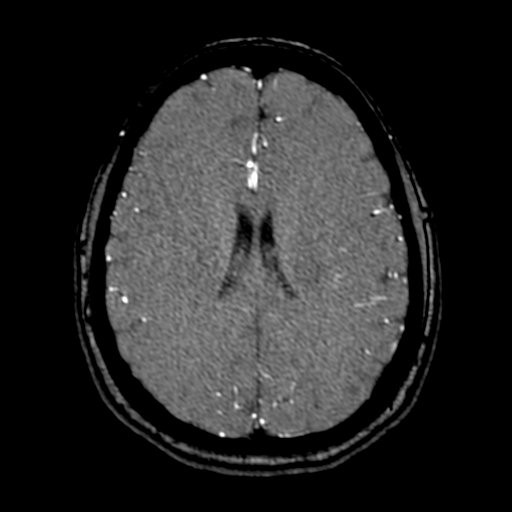
[im 196/205]
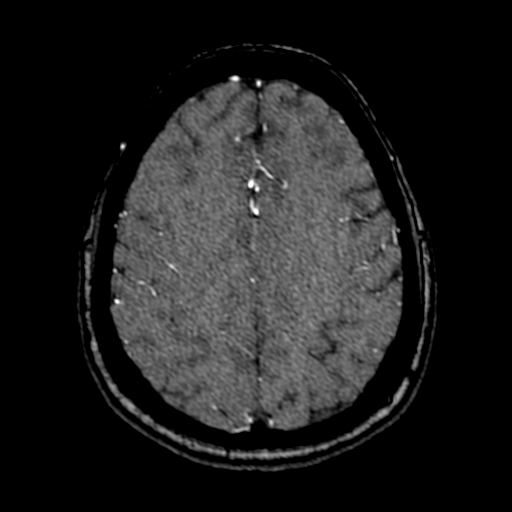

[17 of 48 positions shown; findings below may reference images not displayed]

FINDINGS: MRA NECK FINDINGS

AORTIC ARCH: Visualized aortic arch normal caliber with normal 3
vessel morphology. No hemodynamically significant stenosis seen
about the origin of the great vessels.

RIGHT CAROTID SYSTEM: Right CCA patent from its origin to the
bifurcation without stenosis. There is a severe 70-80% stenosis
involving the proximal right ICA, just distal to the bifurcation
(series [LE], image 7). Area of involvement essentially begins at
the bifurcation, and measures approximately 1 cm in length.
Distally, the right ICA is patent without stenosis, evidence for
dissection, or occlusion.

LEFT CAROTID SYSTEM: Left CCA patent from its origin to the
bifurcation without stenosis. Mild atheromatous irregularity about
the left carotid bulb/proximal left ICA without hemodynamically
significant stenosis. Left ICA patent distally without stenosis,
evidence for dissection or occlusion.

VERTEBRAL ARTERIES: Both vertebral arteries arise from the
subclavian arteries. Short-segment moderate approximate 50% stenosis
at the origin of the right vertebral artery (series [LE], image 9).
Vertebral arteries otherwise patent within the neck without
stenosis, evidence for dissection, or occlusion.

MRA HEAD FINDINGS

ANTERIOR CIRCULATION:

Examination degraded by motion artifact.

Petrous segments patent bilaterally. Diffuse atheromatous
irregularity with narrowing throughout the carotid siphons.
Associated moderate to severe stenosis at the anterior genu of the
cavernous right ICA (series [LE], image 3). Additional multifocal
severe stenoses noted about the cavernous and para clinoid left ICA
(series [LE], image 15). ICA termini well perfused. A1 segments
patent. Normal anterior communicating artery complex. Anterior
cerebral arteries patent to their distal aspects without appreciable
stenosis. No M1 stenosis or occlusion. Normal MCA bifurcations.
Distal MCA branches perfused and fairly symmetric.

POSTERIOR CIRCULATION:

Both V4 segments patent to the vertebrobasilar junction without
stenosis. Left vertebral artery dominant. Left PICA origin patent
and normal. Right PICA not seen. Irregularity within the proximal
and mid basilar artery could be related to atheromatous disease
versus artifact. Similarly, an apparent short-segment moderate
stenosis at the distal basilar artery could be related to artifact
or atherosclerotic disease (sees series [LE], image 10). Superior
cerebellar arteries patent proximally. Both PCAs primarily supplied
via the basilar. Short-segment moderate stenosis at the left P1/P2
junction (series [LE], image 10). PCAs otherwise well perfused to
their distal aspects without stenosis.

No intracranial aneurysm.
IMPRESSION: MRA HEAD IMPRESSION:

1. Motion degraded exam.
2. Atheromatous irregularity throughout the carotid siphons with
associated moderate to severe stenoses, left worse than right.
3. Short-segment moderate stenosis at the left P1/P2 junction.
4. Atheromatous irregularity versus artifact and possible
short-segment moderate stenosis at the distal basilar artery as
above.

MRA NECK IMPRESSION:

1. Severe 70-80% stenosis involving the proximal right ICA, just
distal to the bifurcation. Finding is likely the source of the acute
watershed infarcts seen on prior brain MRI. Both carotid artery
systems otherwise widely patent within the neck.
2. Short-segment moderate approximate 50% stenosis at the origin of
the right vertebral artery. Vertebral arteries otherwise patent
within the neck

## 2020-06-15 MED ORDER — ENOXAPARIN SODIUM 40 MG/0.4ML IJ SOSY: 40.0000 mg | PREFILLED_SYRINGE | INTRAMUSCULAR | Status: DC

## 2020-06-15 MED ORDER — CLOPIDOGREL BISULFATE 75 MG PO TABS
75.0000 mg | ORAL_TABLET | Freq: Every day | ORAL | Status: DC
  Administered 2020-06-15 – 2020-06-17 (×3): 75 mg via ORAL
  Filled 2020-06-15 (×3): qty 1

## 2020-06-15 MED ORDER — ASPIRIN EC 325 MG PO TBEC
325.0000 mg | DELAYED_RELEASE_TABLET | Freq: Once | ORAL | Status: AC
  Administered 2020-06-15: 325 mg via ORAL
  Filled 2020-06-15: qty 1

## 2020-06-15 MED ORDER — ACETAMINOPHEN 650 MG RE SUPP: 650.0000 mg | RECTAL | Status: DC | PRN

## 2020-06-15 MED ORDER — ACETAMINOPHEN 325 MG PO TABS: 650.0000 mg | ORAL_TABLET | ORAL | Status: DC | PRN

## 2020-06-15 MED ORDER — LABETALOL HCL 5 MG/ML IV SOLN: 10.0000 mg | INTRAVENOUS | Status: DC | PRN

## 2020-06-15 MED ORDER — ASPIRIN 81 MG PO CHEW
81.0000 mg | CHEWABLE_TABLET | Freq: Every day | ORAL | Status: DC
  Administered 2020-06-16 – 2020-06-17 (×2): 81 mg via ORAL
  Filled 2020-06-15 (×2): qty 1

## 2020-06-15 MED ORDER — ASPIRIN 300 MG RE SUPP: 300.0000 mg | Freq: Every day | RECTAL | Status: DC

## 2020-06-15 MED ORDER — STROKE: EARLY STAGES OF RECOVERY BOOK: Freq: Once | Status: DC

## 2020-06-15 MED ORDER — ROSUVASTATIN CALCIUM 20 MG PO TABS
40.0000 mg | ORAL_TABLET | Freq: Every day | ORAL | Status: DC
  Administered 2020-06-15 – 2020-06-17 (×3): 40 mg via ORAL
  Filled 2020-06-15 (×4): qty 2

## 2020-06-15 MED ORDER — GADOBUTROL 1 MMOL/ML IV SOLN
7.0000 mL | Freq: Once | INTRAVENOUS | Status: AC | PRN
  Administered 2020-06-15: 7 mL via INTRAVENOUS

## 2020-06-15 MED ORDER — ACETAMINOPHEN 160 MG/5ML PO SOLN
650.0000 mg | ORAL | Status: DC | PRN
  Filled 2020-06-15: qty 20.3

## 2020-06-15 MED ORDER — INSULIN ASPART 100 UNIT/ML IJ SOLN
0.0000 [IU] | Freq: Three times a day (TID) | INTRAMUSCULAR | Status: DC
  Administered 2020-06-16: 2 [IU] via SUBCUTANEOUS
  Administered 2020-06-16: 13:00:00 3 [IU] via SUBCUTANEOUS
  Administered 2020-06-16 – 2020-06-17 (×2): 2 [IU] via SUBCUTANEOUS
  Filled 2020-06-15 (×4): qty 1

## 2020-06-15 NOTE — ED Notes (Signed)
Report of to mimi rn

## 2020-06-15 NOTE — H&P (Addendum)
History and Physical    Sharon Gray ZDG:[id number] DOB: 10/30/1965 DOA: 06/15/2020  PCP: Pcp, No  Patient coming from: Home  I have personally briefly reviewed patient's old medical records in [facility]  Chief Complaint: Left-sided numbness  HPI: Sharon Gray is a 55 y.o. female with medical history significant for type 2 diabetes and hypertension who presented to the ED for evaluation of left-sided numbness.  Patient states she was in her usual state of health until around 5 PM on 5/24 when she developed new onset of left-sided weakness with heaviness in her left leg.  She says she nearly collapsed to the ground however her husband was by her side and able to gently guide her to the floor.  She did not sustain any injury or lose consciousness.  She said she was having difficulty walking due to the heaviness in her left leg and feeling uncoordinated.  She otherwise denies any lightheadedness, dizziness, headache, subjective fevers, chills, diaphoresis, chest pain, palpitations, dyspnea, abdominal pain, nausea, vomiting, or diarrhea.  She says the only medication she is currently taking is glipizide for diabetes.  She says she was previously on antihypertensives however her blood pressure was on the lower side therefore it was discontinued.  She reports a family history of stroke in her father.  ED Course:  Initial vitals showed BP 158/83, pulse 84, RR 18, temp 98.2 F, SPO2 100% on room air.  Labs show WBC 9.8, hemoglobin 11.4, platelets 296,000, sodium 138, potassium 4.2, bicarb 26, BUN 28, creatinine 0.68, serum glucose 193, LFTs within normal limits, high-sensitivity troponin I <2.  SARS-CoV-2 PCR is positive.  Influenza A/B PCR's are negative.  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  MRI brain without contrast showed patchy small acute  cortical/subcortical infarct within the right MCA territory, as well as right MCA/ACA and MCA/PCA watershed territories.  Infarcts are present within the right frontal and parietal lobes.  Incidentally noted left retrocerebellar arachnoid cyst again noted.  Incidentally noted 7 mm hyperintense focus within the right parotid gland also noted.  Patient was given aspirin 325 mg once.  EDP discussed with on-call neurology who will follow.  The hospitalist service was consulted to admit for further evaluation and management.  Review of Systems: All systems reviewed and are negative except as documented in history of present illness above.   Past Medical History:  Diagnosis Date  . Diabetes mellitus without complication (HCC)   . Hypertension     History reviewed. No pertinent surgical history.  Social History:  reports that she has never smoked. She has never used smokeless tobacco. She reports previous alcohol use. She reports previous drug use.  No Known Allergies  Family History  Problem Relation Age of Onset  . Stroke Father      Prior to Admission medications   Not on File    Physical Exam: Vitals:   06/15/20 1700 06/15/20 1715 06/15/20 1730 06/15/20 1745  BP: (!) 150/72 (!) 148/77    Pulse: 79 81 78 82  Resp: 17 (!) 21    Temp:      TempSrc:      SpO2: 100% 100% 100% 100%   Constitutional: NAD, calm, comfortable Eyes: PERRL, EOMI, lids and conjunctivae normal ENMT: Mucous membranes are moist. Posterior pharynx clear of any exudate or lesions.Normal dentition.  Neck: normal, supple, no masses. Respiratory: clear to auscultation bilaterally, no wheezing,  no crackles. Normal respiratory effort. No accessory muscle use.  Cardiovascular: Regular rate and rhythm, no murmurs / rubs / gallops. No extremity edema. 2+ pedal pulses. Abdomen: no tenderness, no masses palpated. Musculoskeletal: no clubbing / cyanosis. No joint deformity upper and lower extremities. Good ROM, no  contractures. Normal muscle tone.  Skin: no rashes, lesions, ulcers. No induration Neurologic: CN 2-12 grossly intact. Sensation somewhat diminished left lower leg otherwise intact. Strength 4/5 LLE otherwise 5/5 in all other extremities. Psychiatric: Normal judgment and insight. Alert and oriented x 3. Normal mood.   Labs on Admission: I have personally reviewed following labs and imaging studies  CBC: Recent Labs  Lab 06/15/20 1448  WBC 9.8  NEUTROABS 5.4  HGB 11.4*  HCT 34.5*  MCV 87.1  PLT 296   Basic Metabolic Panel: Recent Labs  Lab 06/15/20 1448  NA 138  K 4.2  CL 104  CO2 26  GLUCOSE 193*  BUN 28*  CREATININE 0.68  CALCIUM 9.1   GFR: CrCl cannot be calculated (Unknown ideal weight.). Liver Function Tests: Recent Labs  Lab 06/15/20 1448  AST 23  ALT 31  ALKPHOS 119  BILITOT 0.4  PROT 8.4*  ALBUMIN 3.7   No results for input(s): LIPASE, AMYLASE in the last 168 hours. No results for input(s): AMMONIA in the last 168 hours. Coagulation Profile: Recent Labs  Lab 06/15/20 1448  INR 1.0   Cardiac Enzymes: No results for input(s): CKTOTAL, CKMB, CKMBINDEX, TROPONINI in the last 168 hours. BNP (last 3 results) No results for input(s): PROBNP in the last 8760 hours. HbA1C: No results for input(s): HGBA1C in the last 72 hours. CBG: Recent Labs  Lab 06/15/20 1447  GLUCAP 175*   Lipid Profile: No results for input(s): CHOL, HDL, LDLCALC, TRIG, CHOLHDL, LDLDIRECT in the last 72 hours. Thyroid Function Tests: No results for input(s): TSH, T4TOTAL, FREET4, T3FREE, THYROIDAB in the last 72 hours. Anemia Panel: No results for input(s): VITAMINB12, FOLATE, FERRITIN, TIBC, IRON, RETICCTPCT in the last 72 hours. Urine analysis:    Component Value Date/Time   COLORURINE Straw 01/27/2014 0144   APPEARANCEUR Cloudy 01/27/2014 0144   LABSPEC 1.033 01/27/2014 0144   PHURINE 6.0 01/27/2014 0144   GLUCOSEU >=500 01/27/2014 0144   HGBUR 1+ 01/27/2014 0144    BILIRUBINUR Negative 01/27/2014 0144   KETONESUR Negative 01/27/2014 0144   PROTEINUR 30 mg/dL 13/08/657801/06/2014 [id number]   NITRITE Negative 01/27/2014 0144   LEUKOCYTESUR 3+ 01/27/2014 0144    Radiological Exams on Admission: CT HEAD WO CONTRAST  Result Date: 06/15/2020 CLINICAL DATA:  Provided history: Neuro deficit, acute, stroke suspected. Additional history provided: Left-sided numbness which began last night. EXAM: CT HEAD WITHOUT CONTRAST TECHNIQUE: Contiguous axial images were obtained from the base of the skull through the vertex without intravenous contrast. COMPARISON:  No pertinent prior exams available for comparison. FINDINGS: Brain: Cerebral volume is normal. 6 mm focal hypodensity within the right frontal lobe centrum semiovale (series 3, image 19) (series 5, image 22). Retrocerebellar CSF density prominence just to the left of midline measuring 2.1 x 2.4 cm in transaxial dimensions (series 3, image 13) (series 5, image 28). This likely reflects an incidental arachnoid cyst. There is no acute intracranial hemorrhage. No demarcated cortical infarct. No extra-axial fluid collection. No evidence of intracranial mass. No midline shift. Partially empty sella turcica. Vascular: No hyperdense vessel.  Atherosclerotic calcifications. Skull: Normal. Negative for fracture or focal lesion. Sinuses/Orbits: Visualized orbits show no acute finding. Moderate sized fluid level within the  left frontal sinus. Scattered mild mucosal thickening and possible fluid within the bilateral ethmoid air cells. IMPRESSION: 6 mm hypodensity within the right frontal lobe white matter, which may reflect an age-indeterminate lacunar infarct or prominent perivascular space. Given the provided history of left-sided weakness, consider a noncontrast brain MRI for further evaluation. 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 This finding is very commonly incidental, but can be associated with  idiopathic intracranial hypertension. Otherwise unremarkable non-contrast CT appearance of the brain. Paranasal sinus disease, as described. Electronically Signed   By: Jackey Loge DO   On: 06/15/2020 15:12   MR BRAIN WO CONTRAST  Result Date: 06/15/2020 CLINICAL DATA:  Brain mass or lesion. EXAM: MRI HEAD WITHOUT CONTRAST TECHNIQUE: Multiplanar, multiecho pulse sequences of the brain and surrounding structures were obtained without intravenous contrast. COMPARISON:  No pertinent prior exams available for comparison. FINDINGS: Brain: Cerebral volume is normal. There are patchy small acute cortical/subcortical infarcts within the right MCA territory, as well as right MCA/ACA and MCA/PCA watershed territories, within the right frontal and parietal lobes. No evidence of intracranial mass. No chronic intracranial blood products. No extra-axial fluid collection. No midline shift. CSF intensity prominence posterior to the cerebellum just to the left of midline measuring 2.1 x 2.5 cm in transaxial dimensions, likely reflecting an incidental arachnoid cyst. Vascular: Expected proximal arterial flow voids. Skull and upper cervical spine: No focal marrow lesion. Sinuses/Orbits: Visualized orbits show no acute finding. Mild left greater than right frontal and bilateral ethmoid sinus mucosal thickening. Trace mucosal thickening is per also present within the bilateral sphenoid sinuses. Other: 7 mm round T2/FLAIR hyperintense focus within the right parotid gland (series 15, image 3). IMPRESSION: Patchy small acute cortical/subcortical infarcts within the right MCA territory, as well as right MCA/ACA and MCA/PCA watershed territories. These infarcts are present within the right frontal and parietal lobes. Consider MR or CT angiography of the head/neck for further evaluation. Incidentally noted left retrocerebellar arachnoid cyst. Incidentally noted 7 mm round T2/FLAIR hyperintense focus within the right parotid gland. This  could reflect a prominent intraparotid lymph node. However, a primary parotid neoplasm cannot be excluded. Nonemergent targeted ultrasound recommended for further characterization. Paranasal sinus disease, as described. Electronically Signed   By: Jackey Loge DO   On: 06/15/2020 18:47    EKG: Personally reviewed.  Normal sinus rhythm without acute ischemic changes.  No prior for comparison.  Assessment/Plan Principal Problem:   Acute CVA (cerebrovascular accident) [facility]) Active Problems:   Type 2 diabetes mellitus (HCC)   Hypertension associated with diabetes (HCC)   Dayanne Yiu is a 56 y.o. female with medical history significant for type 2 diabetes and hypertension who is admitted with acute CVA.  Acute CVA: Presenting with left-sided numbness with some LLE weakness.  Acute cortical/subcortical watershed infarcts in the right MCA territory seen on MRI brain. -Neurology consulted and will see in a.m. -MRA head/neck -Echocardiogram -Started on aspirin 81 mg daily and Plavix 75 mg daily -Check A1c, lipid panel -Continue rosuvastatin 40 mg daily -Keep on telemetry, continue neurochecks -PT/OT/SLP eval -Allow permissive hypertension for now  Type 2 diabetes: Holding home glipizide.  Placed on sensitive SSI.  Check A1c.  Hypertension: Allowing permissive hypertension for now.  IV labetalol as needed.  Right parotid gland focus: Incidentally noted 7 mm round hyperintense focus within the right parotid gland noted on MRI brain.  Nonemergent ultrasound recommended for further characterization.  Incidental COVID-19 positive test: Positive screening COVID-19 test.  She  is asymptomatic.  No targeted therapy at this time.  DVT prophylaxis: Lovenox Code Status: DNR, discussed with patient on admission Family Communication: Discussed with patient spouse at bedside Disposition Plan: From home, dispo pending further CVA work-up Consults called: Neurology Level of care: Med-Surg Admission  status:  Status is: Observation  The patient remains OBS appropriate and will d/c before 2 midnights.  Dispo: The patient is from: Home              Anticipated d/c is to: Home              Patient currently is not medically stable to d/c.   Difficult to place patient No   Darreld Mclean MD [facility]  If 7PM-7AM, please contact night-coverage [email or web address]  06/15/2020, 8:08 PM

## 2020-06-15 NOTE — ED Notes (Signed)
Pt reports left side numbness.  Sx began today.  No headache.  No n/v/d  Pt reports diff walking today.  No slurred speech or droop.  Pt alert  Speech clear.

## 2020-06-15 NOTE — ED Provider Notes (Signed)
[facility] Provider Note ____________________________________________   Event Date/Time   First MD Initiated Contact with Patient 06/15/20 1648     (approximate)  I have reviewed the triage vital signs and the nursing notes.   HISTORY  Chief Complaint Numbness  HPI Sharon [facility] is a 55 y.o. female with history of diabetes and hypertension presents to the emergency department for treatment and evaluation of left side numbness that started yesterday evening around 5:00.  She states that she was sitting and talking with her husband when she developed a heavy feeling in her arm and leg.  Symptoms have continued throughout the day and she now feels a tingling sensation.  She states that she feels as though she has to concentrate to lift her left leg while walking.  She denies chest pain, shortness of breath, cough, nausea, vomiting diarrhea dysuria, musculoskeletal pain.         Past Medical History:  Diagnosis Date  . Diabetes mellitus without complication (HCC)   . Hypertension     Patient Active Problem List   Diagnosis Date Noted  . Acute CVA (cerebrovascular accident) (HCC) 06/15/2020    History reviewed. No pertinent surgical history.  Prior to Admission medications   Not on File    Allergies Patient has no known allergies.  History reviewed. No pertinent family history.  Social History Social History   Tobacco Use  . Smoking status: Never Smoker  . Smokeless tobacco: Never Used  Substance Use Topics  . Alcohol use: Not Currently  . Drug use: Not Currently    Review of Systems  Constitutional: No fever/chills Eyes: No visual changes. ENT: No sore throat. Cardiovascular: Denies chest pain. Respiratory: Denies shortness of breath. Gastrointestinal: No abdominal pain.  No nausea, no vomiting.  No diarrhea.  No constipation. Genitourinary: Negative for dysuria. Musculoskeletal: Negative for back pain. Skin:  Negative for rash. Neurological: Negative for headaches, positive for focal weakness and numbness left upper and lower extremity. ____________________________________________   PHYSICAL EXAM:  VITAL SIGNS: ED Triage Vitals  Enc Vitals Group     BP 06/15/20 1442 (!) 158/83     Pulse Rate 06/15/20 1442 84     Resp 06/15/20 1442 18     Temp 06/15/20 1442 98.2 F (36.8 C)     Temp Source 06/15/20 1442 Oral     SpO2 06/15/20 1442 100 %     Weight --      Height --      Head Circumference --      Peak Flow --      Pain Score 06/15/20 1444 0     Pain Loc --      Pain Edu? --      Excl. in GC? --     Constitutional: Alert and oriented.  Overall well appearing and in no acute distress. Eyes: Conjunctivae are normal. PERRL. EOMI. Head: Atraumatic. Nose: No congestion/rhinnorhea. Mouth/Throat: Mucous membranes are moist.  Oropharynx non-erythematous. Neck: No stridor.   Hematological/Lymphatic/Immunilogical: No cervical lymphadenopathy. Cardiovascular: Normal rate, regular rhythm. Grossly normal heart sounds.  Good peripheral circulation. Respiratory: Normal respiratory effort.  No retractions. Lungs CTAB. Gastrointestinal: Soft and nontender. No distention. No abdominal bruits.  Musculoskeletal: No lower extremity tenderness nor edema.  No joint effusions. Neurologic:  Normal speech and language.  Mild weakness left upper and lower extremity without  gait instability. No facial droop. Skin:  Skin is warm, dry and intact. No rash noted. Psychiatric: Mood and affect are  normal. Speech and behavior are normal.  ____________________________________________   LABS (all labs ordered are listed, but only abnormal results are displayed)  Labs Reviewed  RESP PANEL BY RT-PCR (FLU A&B, COVID) ARPGX2 - Abnormal; Notable for the following components:      Result Value   SARS Coronavirus 2 by RT PCR POSITIVE (*)    All other components within normal limits  CBC - Abnormal; Notable for the  following components:   Hemoglobin 11.4 (*)    HCT 34.5 (*)    All other components within normal limits  COMPREHENSIVE METABOLIC PANEL - Abnormal; Notable for the following components:   Glucose, Bld 193 (*)    BUN 28 (*)    Total Protein 8.4 (*)    All other components within normal limits  CBG MONITORING, ED - Abnormal; Notable for the following components:   Glucose-Capillary 175 (*)    All other components within normal limits  PROTIME-INR  APTT  DIFFERENTIAL  URINALYSIS, COMPLETE (UACMP) WITH MICROSCOPIC  TROPONIN I (HIGH SENSITIVITY)  TROPONIN I (HIGH SENSITIVITY)   ____________________________________________  EKG  ED ECG REPORT I, Zakari Couchman, FNP-BC personally viewed and interpreted this ECG.   Date: 06/15/2020  EKG Time: 1452  Rate: 85  Rhythm: normal sinus rhythm  Axis: normal  Intervals:none  ST&T Change: no ST elevation  ____________________________________________  RADIOLOGY  ED MD interpretation:    CT scan indeterminant for subarachnoid cyst.  MRI shows a small acute cortical/subcortical infarct within the right MCA territory watershed infarcts present within the right frontal and parietal lobes.  I, Kem Boroughsari Moosa Bueche, personally viewed and evaluated these images (plain radiographs) as part of my medical decision making, as well as reviewing the written report by the radiologist.  Official radiology report(s): CT HEAD WO CONTRAST  Result Date: 06/15/2020 CLINICAL DATA:  Provided history: Neuro deficit, acute, stroke suspected. Additional history provided: Left-sided numbness which began last night. EXAM: CT HEAD WITHOUT CONTRAST TECHNIQUE: Contiguous axial images were obtained from the base of the skull through the vertex without intravenous contrast. COMPARISON:  No pertinent prior exams available for comparison. FINDINGS: Brain: Cerebral volume is normal. 6 mm focal hypodensity within the right frontal lobe centrum semiovale (series 3, image 19)  (series 5, image 22). Retrocerebellar CSF density prominence just to the left of midline measuring 2.1 x 2.4 cm in transaxial dimensions (series 3, image 13) (series 5, image 28). This likely reflects an incidental arachnoid cyst. There is no acute intracranial hemorrhage. No demarcated cortical infarct. No extra-axial fluid collection. No evidence of intracranial mass. No midline shift. Partially empty sella turcica. Vascular: No hyperdense vessel.  Atherosclerotic calcifications. Skull: Normal. Negative for fracture or focal lesion. Sinuses/Orbits: Visualized orbits show no acute finding. Moderate sized fluid level within the left frontal sinus. Scattered mild mucosal thickening and possible fluid within the bilateral ethmoid air cells. IMPRESSION: 6 mm hypodensity within the right frontal lobe white matter, which may reflect an age-indeterminate lacunar infarct or prominent perivascular space. Given the provided history of left-sided weakness, consider a noncontrast brain MRI for further evaluation. 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 This finding is very commonly incidental, but can be associated with idiopathic intracranial hypertension. Otherwise unremarkable non-contrast CT appearance of the brain. Paranasal sinus disease, as described. Electronically Signed   By: Jackey LogeKyle  Golden DO   On: 06/15/2020 15:12   MR BRAIN WO CONTRAST  Result Date: 06/15/2020 CLINICAL DATA:  Brain mass or lesion. EXAM: MRI HEAD WITHOUT CONTRAST  TECHNIQUE: Multiplanar, multiecho pulse sequences of the brain and surrounding structures were obtained without intravenous contrast. COMPARISON:  No pertinent prior exams available for comparison. FINDINGS: Brain: Cerebral volume is normal. There are patchy small acute cortical/subcortical infarcts within the right MCA territory, as well as right MCA/ACA and MCA/PCA watershed territories, within the right frontal and parietal lobes. No  evidence of intracranial mass. No chronic intracranial blood products. No extra-axial fluid collection. No midline shift. CSF intensity prominence posterior to the cerebellum just to the left of midline measuring 2.1 x 2.5 cm in transaxial dimensions, likely reflecting an incidental arachnoid cyst. Vascular: Expected proximal arterial flow voids. Skull and upper cervical spine: No focal marrow lesion. Sinuses/Orbits: Visualized orbits show no acute finding. Mild left greater than right frontal and bilateral ethmoid sinus mucosal thickening. Trace mucosal thickening is per also present within the bilateral sphenoid sinuses. Other: 7 mm round T2/FLAIR hyperintense focus within the right parotid gland (series 15, image 3). IMPRESSION: Patchy small acute cortical/subcortical infarcts within the right MCA territory, as well as right MCA/ACA and MCA/PCA watershed territories. These infarcts are present within the right frontal and parietal lobes. Consider MR or CT angiography of the head/neck for further evaluation. Incidentally noted left retrocerebellar arachnoid cyst. Incidentally noted 7 mm round T2/FLAIR hyperintense focus within the right parotid gland. This could reflect a prominent intraparotid lymph node. However, a primary parotid neoplasm cannot be excluded. Nonemergent targeted ultrasound recommended for further characterization. Paranasal sinus disease, as described. Electronically Signed   By: Jackey Loge DO   On: 06/15/2020 18:47    ____________________________________________   PROCEDURES  Procedure(s) performed (including Critical Care):  Procedures  ____________________________________________   INITIAL IMPRESSION / ASSESSMENT AND PLAN     55 year old female presenting to the emergency department for treatment and evaluation of left-sided weakness that started approximately 5 PM yesterday afternoon.  See HPI for further details.  Plan will be to do CVA work-up.  DIFFERENTIAL  DIAGNOSIS  CVA, TIA, electrolyte imbalance cardiac event, thyroid disease  ED COURSE  CT of the head indeterminate.  Per recommendation of the radiologist, MRI of the head without contrast has been ordered.  MRI results do show concern for recent CVA.  Dr. Selina Cooley with neurology has been paged for consult.  After review of the MRI, Dr. Selina Cooley feels that her remaining deficits on the left side are consistent with the area of lesions.  Plan will be to give her 325 mg of aspirin and to admit her through the hospitalist.  Dr. Selina Cooley states that she will put in CVA work-up orders.  Additionally, patient is COVID-19 positive.  Hospitalist consult has been requested.  Dr. Allena Katz with Hospitalist service accepts patient for admission.    ___________________________________________   FINAL CLINICAL IMPRESSION(S) / ED DIAGNOSES  Final diagnoses:  Cerebrovascular accident (CVA), unspecified mechanism (HCC)  COVID     ED Discharge Orders    None       Manuella Blackson was evaluated in Emergency Department on 06/15/2020 for the symptoms described in the history of present illness. She was evaluated in the context of the global COVID-19 pandemic, which necessitated consideration that the patient might be at risk for infection with the SARS-CoV-2 virus that causes COVID-19. Institutional protocols and algorithms that pertain to the evaluation of patients at risk for COVID-19 are in a state of rapid change based on information released by regulatory bodies including the CDC and federal and state organizations. These policies and algorithms were followed during the  patient's care in the ED.   Note:  This document was prepared using Dragon voice recognition software and may include unintentional dictation errors.   Chinita Pester, FNP 06/15/20 1933    Dionne Bucy, MD 06/15/20 2236

## 2020-06-15 NOTE — ED Triage Notes (Signed)
Pt comes into the ED via POV c/o left side numbness that started last night.  Pt states that when she walks everything feels funny, and she has a numbness sensation on the complete left side.  Pt has no drift noted in her arms and no droop present.  Pt has clear speech is A&Ox4 and in NAD with even and unlabored respirations.  Pt denies any headache.

## 2020-06-16 ENCOUNTER — Observation Stay (HOSPITAL_BASED_OUTPATIENT_CLINIC_OR_DEPARTMENT_OTHER)
Admit: 2020-06-16 | Discharge: 2020-06-16 | Disposition: A | Discharge status: Home / Self Care | Payer: Medicaid Other | Attending: Neurology | Admitting: Neurology

## 2020-06-16 DIAGNOSIS — I639 Cerebral infarction, unspecified: Secondary | ICD-10-CM | POA: Diagnosis not present

## 2020-06-16 DIAGNOSIS — I6389 Other cerebral infarction: Secondary | ICD-10-CM | POA: Diagnosis not present

## 2020-06-16 DIAGNOSIS — U071 COVID-19: Secondary | ICD-10-CM

## 2020-06-16 DIAGNOSIS — I10 Essential (primary) hypertension: Secondary | ICD-10-CM | POA: Diagnosis not present

## 2020-06-16 LAB — URINALYSIS, COMPLETE (UACMP) WITH MICROSCOPIC
Bilirubin Urine: NEGATIVE
Glucose, UA: NEGATIVE mg/dL
Ketones, ur: NEGATIVE mg/dL
Nitrite: POSITIVE — AB
Protein, ur: 30 mg/dL — AB
Specific Gravity, Urine: 1.008 (ref 1.005–1.030)
WBC, UA: >50 WBC/hpf — ABNORMAL HIGH (ref 0–5)
pH: 6 (ref 5.0–8.0)

## 2020-06-16 LAB — ECHOCARDIOGRAM COMPLETE BUBBLE STUDY
AR max vel: 2.07 cm2
AV Area VTI: 2.59 cm2
AV Area mean vel: 2.35 cm2
AV Mean grad: 6 mmHg
AV Peak grad: 10.4 mmHg
Ao pk vel: 1.61 m/s
Area-P 1/2: 5.62 cm2
S' Lateral: 2.3 cm

## 2020-06-16 LAB — LIPID PANEL
Cholesterol: 91 mg/dL (ref 0–200)
HDL: 29 mg/dL — ABNORMAL LOW
LDL Cholesterol: 41 mg/dL (ref 0–99)
Total CHOL/HDL Ratio: 3.1 ratio
Triglycerides: 105 mg/dL
VLDL: 21 mg/dL (ref 0–40)

## 2020-06-16 LAB — HEMOGLOBIN A1C
Hgb A1c MFr Bld: 8.4 % — ABNORMAL HIGH (ref 4.8–5.6)
Mean Plasma Glucose: 194 mg/dL

## 2020-06-16 LAB — GLUCOSE, CAPILLARY
Glucose-Capillary: 182 mg/dL — ABNORMAL HIGH (ref 70–99)
Glucose-Capillary: 230 mg/dL — ABNORMAL HIGH (ref 70–99)
Glucose-Capillary: 191 mg/dL — ABNORMAL HIGH (ref 70–99)
Glucose-Capillary: 179 mg/dL — ABNORMAL HIGH (ref 70–99)

## 2020-06-16 LAB — HIV ANTIBODY (ROUTINE TESTING W REFLEX): HIV Screen 4th Generation wRfx: NONREACTIVE

## 2020-06-16 LAB — LDL CHOLESTEROL, DIRECT: Direct LDL: 50 mg/dL (ref 0–99)

## 2020-06-16 MED ORDER — ENOXAPARIN SODIUM 40 MG/0.4ML IJ SOSY
40.0000 mg | PREFILLED_SYRINGE | INTRAMUSCULAR | Status: DC
  Administered 2020-06-16 – 2020-06-17 (×2): 40 mg via SUBCUTANEOUS
  Filled 2020-06-16 (×2): qty 0.4

## 2020-06-16 NOTE — Consult Note (Signed)
 @   MRN : [id number]  Sharon Gray is a 55 y.o. ([birth year]) female who presents with chief complaint of  Chief Complaint  Patient presents with  . Numbness  .  History of Present Illness:   The patient is seen for evaluation of carotid stenosis. The carotid stenosis was identified after the patient presented to [facility] with the acute onset of left sided weakness earlier today.  Work-up in the emergency room included an MRA which demonstrated 90% stenosis at the origin of the right internal carotid artery.  This evening on talking with the patient she notes that her left arm is already gotten quite a bit stronger and her leg is somewhat improved.  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 There is no previous history of TIA symptoms or focal motor deficits. There is no prior documented CVA.  There is no history of migraine headaches. There is no history of seizures.  The patient is taking enteric-coated aspirin 81 mg daily.  The patient has a history of coronary artery disease, no recent episodes of angina or shortness of breath. The patient denies PAD or claudication symptoms. There is a history of hyperlipidemia which is being treated with a statin.    Current Meds  Medication Sig  . glimepiride (AMARYL) 4 MG tablet Take 4 mg by mouth 2 (two) times daily.  . rosuvastatin (CRESTOR) 40 MG tablet Take 40 mg by mouth daily.  . Vitamin D, Ergocalciferol, (DRISDOL) 1.25 MG (50000 UNIT) CAPS capsule Take 1 capsule by mouth once a week.    Past Medical History:  Diagnosis Date  . Diabetes mellitus without complication (HCC)   . Hypertension     History reviewed. No pertinent surgical history.  Social History Social History   Tobacco Use  . Smoking status: Never Smoker  . Smokeless tobacco: Never Used  Substance Use Topics  . Alcohol use: Not Currently  . Drug use: Not  Currently    Family History Family History  Problem Relation Age of Onset  . Stroke Father     No Known Allergies   REVIEW OF SYSTEMS (Negative unless checked)  Constitutional: Weight loss  Fever  Chills Cardiac: Chest pain   Chest pressure   Palpitations   Shortness of breath when laying flat   Shortness of breath with exertion. Vascular:  Pain in legs with walking   Pain in legs at rest  History of DVT   Phlebitis   Swelling in legs   Varicose veins   Non-healing ulcers Pulmonary:   Uses home oxygen   Productive cough   Hemoptysis   Wheeze  COPD   Asthma Neurologic:  Dizziness   Seizures   History of stroke   History of TIA  Aphasia   Vissual changes   Weakness or numbness in arm   Weakness or numbness in leg Musculoskeletal:   Joint swelling   Joint pain   Low back pain Hematologic:  Easy bruising  Easy bleeding   Hypercoagulable state   Anemic Gastrointestinal:  Diarrhea   Vomiting  Gastroesophageal reflux/heartburn   Difficulty swallowing. Genitourinary:  Chronic kidney disease   Difficult urination  Frequent urination   Blood in urine Skin:  Rashes   Ulcers  Psychological:  History of anxiety    History of major depression.  Physical Examination  Vitals:   06/16/20 0806 06/16/20 1150 06/16/20 1526 06/16/20 1555  BP: (!) 111/57 Marland Kitchen)  108/58 116/73 123/72  Pulse: 86 81 81 90  Resp: Temp: 99.4 F (37.4 C) 99.1 F (37.3 C) 98 F (36.7 C) 98.9 F (37.2 C)  TempSrc: Oral  Oral   SpO2: 98% 100% 95% 100%  Weight:      Height:       Body mass index is 29.57 kg/m. Gen: WD/WN, NAD Head: [place]/AT, No temporalis wasting.  Ear/Nose/Throat: Hearing grossly intact, nares w/o erythema or drainage Eyes: PER, EOMI, sclera nonicteric.  Neck: Supple, no large masses.   Pulmonary:  Good air movement, no audible wheezing bilaterally, no use of accessory muscles.  Cardiac: RRR,  no JVD Vascular:  Vessel Right Left  Radial Palpable Palpable  Gastrointestinal: Non-distended. No guarding/no peritoneal signs.  Musculoskeletal: M/S 5/5 throughout on the right and 3-4 on the left.  No deformity or atrophy.  Neurologic: CN 2-12 intact. Symmetrical.  Speech is fluent. Motor exam as listed above. Psychiatric: Judgment intact, Mood & affect appropriate for pt's clinical situation. Dermatologic: No rashes or ulcers noted.  No changes consistent with cellulitis.   CBC Lab Results  Component Value Date   WBC 9.8 06/15/2020   HGB 11.4 (L) 06/15/2020   HCT 34.5 (L) 06/15/2020   MCV 87.1 06/15/2020   PLT 296 06/15/2020    BMET    Component Value Date/Time   NA 138 06/15/2020 1448   NA 137 01/27/2014 0144   K 4.2 06/15/2020 1448   K 4.1 01/27/2014 0144   CL 104 06/15/2020 1448   CL 102 01/27/2014 0144   CO2 26 06/15/2020 1448   CO2 27 01/27/2014 0144   GLUCOSE 193 (H) 06/15/2020 1448   GLUCOSE 401 (H) 01/27/2014 0144   BUN 28 (H) 06/15/2020 1448   BUN 18 01/27/2014 0144   CREATININE 0.68 06/15/2020 1448   CREATININE 0.93 01/27/2014 0144   CALCIUM 9.1 06/15/2020 1448   CALCIUM 8.4 (L) 01/27/2014 0144   GFRNONAA >60 06/15/2020 1448   GFRNONAA >60 01/27/2014 0144   GFRAA >60 01/27/2014 0144   Estimated Creatinine Clearance: 85.2 mL/min (by C-G formula based on SCr of 0.68 mg/dL).  COAG Lab Results  Component Value Date   INR 1.0 06/15/2020    Radiology CT HEAD WO CONTRAST  Result Date: 06/15/2020 CLINICAL DATA:  Provided history: Neuro deficit, acute, stroke suspected. Additional history provided: Left-sided numbness which began last night. EXAM: CT HEAD WITHOUT CONTRAST TECHNIQUE: Contiguous axial images were obtained from the base of the skull through the vertex without intravenous contrast. COMPARISON:  No pertinent prior exams available for comparison. FINDINGS: Brain: Cerebral volume is normal. 6 mm focal hypodensity within the right frontal lobe  centrum semiovale (series 3, image 19) (series 5, image 22). Retrocerebellar CSF density prominence just to the left of midline measuring 2.1 x 2.4 cm in transaxial dimensions (series 3, image 13) (series 5, image 28). This likely reflects an incidental arachnoid cyst. There is no acute intracranial hemorrhage. No demarcated cortical infarct. No extra-axial fluid collection. No evidence of intracranial mass. No midline shift. Partially empty sella turcica. Vascular: No hyperdense vessel.  Atherosclerotic calcifications. Skull: Normal. Negative for fracture or focal lesion. Sinuses/Orbits: Visualized orbits show no acute finding. Moderate sized fluid level within the left frontal sinus. Scattered mild mucosal thickening and possible fluid within the bilateral ethmoid air cells. IMPRESSION: 6 mm hypodensity within the right frontal lobe white matter, which may reflect an age-indeterminate lacunar infarct or prominent perivascular space. Given the provided history of  left-sided weakness, consider a noncontrast brain MRI for further evaluation. 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 This finding is very commonly incidental, but can be associated with idiopathic intracranial hypertension. Otherwise unremarkable non-contrast CT appearance of the brain. Paranasal sinus disease, as described. Electronically Signed   By: Jackey Loge DO   On: 06/15/2020 15:12   MR ANGIO HEAD WO CONTRAST  Result Date: 06/15/2020 CLINICAL DATA:  Initial evaluation for acute stroke. EXAM: MRA NECK WITHOUT AND WITH CONTRAST MRA HEAD WITHOUT CONTRAST TECHNIQUE: Multiplanar and multiecho pulse sequences of the neck were obtained without and with intravenous contrast. Angiographic images of the neck were obtained using MRA technique without and with intravenous contrast; Angiographic images of the Circle of Willis were obtained using MRA technique without intravenous contrast. CONTRAST:  59mL  GADAVIST GADOBUTROL 1 MMOL/ML IV SOLN COMPARISON:  Prior brain MRI from earlier the same day. FINDINGS: MRA NECK FINDINGS AORTIC ARCH: Visualized aortic arch normal caliber with normal 3 vessel morphology. No hemodynamically significant stenosis seen about the origin of the great vessels. RIGHT CAROTID SYSTEM: Right CCA patent from its origin to the bifurcation without stenosis. There is a severe 70-80% stenosis involving the proximal right ICA, just distal to the bifurcation (series 1059, image 7). Area of involvement essentially begins at the bifurcation, and measures approximately 1 cm in length. Distally, the right ICA is patent without stenosis, evidence for dissection, or occlusion. LEFT CAROTID SYSTEM: Left CCA patent from its origin to the bifurcation without stenosis. Mild atheromatous irregularity about the left carotid bulb/proximal left ICA without hemodynamically significant stenosis. Left ICA patent distally without stenosis, evidence for dissection or occlusion. VERTEBRAL ARTERIES: Both vertebral arteries arise from the subclavian arteries. Short-segment moderate approximate 50% stenosis at the origin of the right vertebral artery (series 1071, image 9). Vertebral arteries otherwise patent within the neck without stenosis, evidence for dissection, or occlusion. MRA HEAD FINDINGS ANTERIOR CIRCULATION: Examination degraded by motion artifact. Petrous segments patent bilaterally. Diffuse atheromatous irregularity with narrowing throughout the carotid siphons. Associated moderate to severe stenosis at the anterior genu of the cavernous right ICA (series 1042, image 3). Additional multifocal severe stenoses noted about the cavernous and para clinoid left ICA (series 1042, image 15). ICA termini well perfused. A1 segments patent. Normal anterior communicating artery complex. Anterior cerebral arteries patent to their distal aspects without appreciable stenosis. No M1 stenosis or occlusion. Normal MCA  bifurcations. Distal MCA branches perfused and fairly symmetric. POSTERIOR CIRCULATION: Both V4 segments patent to the vertebrobasilar junction without stenosis. Left vertebral artery dominant. Left PICA origin patent and normal. Right PICA not seen. Irregularity within the proximal and mid basilar artery could be related to atheromatous disease versus artifact. Similarly, an apparent short-segment moderate stenosis at the distal basilar artery could be related to artifact or atherosclerotic disease (sees series 1052, image 10). Superior cerebellar arteries patent proximally. Both PCAs primarily supplied via the basilar. Short-segment moderate stenosis at the left P1/P2 junction (series 1052, image 10). PCAs otherwise well perfused to their distal aspects without stenosis. No intracranial aneurysm. IMPRESSION: MRA HEAD IMPRESSION: 1. Motion degraded exam. 2. Atheromatous irregularity throughout the carotid siphons with associated moderate to severe stenoses, left worse than right. 3. Short-segment moderate stenosis at the left P1/P2 junction. 4. Atheromatous irregularity versus artifact and possible short-segment moderate stenosis at the distal basilar artery as above. MRA NECK IMPRESSION: 1. 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 Finding is likely the source  of the acute watershed infarcts seen on prior brain MRI. Both carotid artery systems otherwise widely patent within the neck. 2. Short-segment moderate approximate 50% stenosis at the origin of the right vertebral artery. Vertebral arteries otherwise patent within the neck Electronically Signed   By: Rise Mu M.D.   On: 06/15/2020 23:23   MR ANGIO NECK W WO CONTRAST  Result Date: 06/15/2020 CLINICAL DATA:  Initial evaluation for acute stroke. EXAM: MRA NECK WITHOUT AND WITH CONTRAST MRA HEAD WITHOUT CONTRAST TECHNIQUE: Multiplanar and multiecho pulse sequences of the neck were obtained without  and with intravenous contrast. Angiographic images of the neck were obtained using MRA technique without and with intravenous contrast; Angiographic images of the Circle of Willis were obtained using MRA technique without intravenous contrast. CONTRAST:  7mL GADAVIST GADOBUTROL 1 MMOL/ML IV SOLN COMPARISON:  Prior brain MRI from earlier the same day. FINDINGS: MRA NECK FINDINGS AORTIC ARCH: Visualized aortic arch normal caliber with normal 3 vessel morphology. No hemodynamically significant stenosis seen about the origin of the great vessels. RIGHT CAROTID SYSTEM: Right CCA patent from its origin to the bifurcation without stenosis. There is a severe 70-80% stenosis involving the proximal right ICA, just distal to the bifurcation (series 1059, image 7). Area of involvement essentially begins at the bifurcation, and measures approximately 1 cm in length. Distally, the right ICA is patent without stenosis, evidence for dissection, or occlusion. LEFT CAROTID SYSTEM: Left CCA patent from its origin to the bifurcation without stenosis. Mild atheromatous irregularity about the left carotid bulb/proximal left ICA without hemodynamically significant stenosis. Left ICA patent distally without stenosis, evidence for dissection or occlusion. VERTEBRAL ARTERIES: Both vertebral arteries arise from the subclavian arteries. Short-segment moderate approximate 50% stenosis at the origin of the right vertebral artery (series 1071, image 9). Vertebral arteries otherwise patent within the neck without stenosis, evidence for dissection, or occlusion. MRA HEAD FINDINGS ANTERIOR CIRCULATION: Examination degraded by motion artifact. Petrous segments patent bilaterally. Diffuse atheromatous irregularity with narrowing throughout the carotid siphons. Associated moderate to severe stenosis at the anterior genu of the cavernous right ICA (series 1042, image 3). Additional multifocal severe stenoses noted about the cavernous and para clinoid  left ICA (series 1042, image 15). ICA termini well perfused. A1 segments patent. Normal anterior communicating artery complex. Anterior cerebral arteries patent to their distal aspects without appreciable stenosis. No M1 stenosis or occlusion. Normal MCA bifurcations. Distal MCA branches perfused and fairly symmetric. POSTERIOR CIRCULATION: Both V4 segments patent to the vertebrobasilar junction without stenosis. Left vertebral artery dominant. Left PICA origin patent and normal. Right PICA not seen. Irregularity within the proximal and mid basilar artery could be related to atheromatous disease versus artifact. Similarly, an apparent short-segment moderate stenosis at the distal basilar artery could be related to artifact or atherosclerotic disease (sees series 1052, image 10). Superior cerebellar arteries patent proximally. Both PCAs primarily supplied via the basilar. Short-segment moderate stenosis at the left P1/P2 junction (series 1052, image 10). PCAs otherwise well perfused to their distal aspects without stenosis. No intracranial aneurysm. IMPRESSION: MRA HEAD IMPRESSION: 1. Motion degraded exam. 2. Atheromatous irregularity throughout the carotid siphons with associated moderate to severe stenoses, left worse than right. 3. Short-segment moderate stenosis at the left P1/P2 junction. 4. Atheromatous irregularity versus artifact and possible short-segment moderate stenosis at the distal basilar artery as above. MRA NECK IMPRESSION: 1. 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 Finding is likely the source of the acute watershed infarcts  seen on prior brain MRI. Both carotid artery systems otherwise widely patent within the neck. 2. Short-segment moderate approximate 50% stenosis at the origin of the right vertebral artery. Vertebral arteries otherwise patent within the neck Electronically Signed   By: Rise Mu M.D.   On: 06/15/2020 23:23   MR BRAIN  WO CONTRAST  Result Date: 06/15/2020 CLINICAL DATA:  Brain mass or lesion. EXAM: MRI HEAD WITHOUT CONTRAST TECHNIQUE: Multiplanar, multiecho pulse sequences of the brain and surrounding structures were obtained without intravenous contrast. COMPARISON:  No pertinent prior exams available for comparison. FINDINGS: Brain: Cerebral volume is normal. There are patchy small acute cortical/subcortical infarcts within the right MCA territory, as well as right MCA/ACA and MCA/PCA watershed territories, within the right frontal and parietal lobes. No evidence of intracranial mass. No chronic intracranial blood products. No extra-axial fluid collection. No midline shift. CSF intensity prominence posterior to the cerebellum just to the left of midline measuring 2.1 x 2.5 cm in transaxial dimensions, likely reflecting an incidental arachnoid cyst. Vascular: Expected proximal arterial flow voids. Skull and upper cervical spine: No focal marrow lesion. Sinuses/Orbits: Visualized orbits show no acute finding. Mild left greater than right frontal and bilateral ethmoid sinus mucosal thickening. Trace mucosal thickening is per also present within the bilateral sphenoid sinuses. Other: 7 mm round T2/FLAIR hyperintense focus within the right parotid gland (series 15, image 3). IMPRESSION: Patchy small acute cortical/subcortical infarcts within the right MCA territory, as well as right MCA/ACA and MCA/PCA watershed territories. These infarcts are present within the right frontal and parietal lobes. Consider MR or CT angiography of the head/neck for further evaluation. Incidentally noted left retrocerebellar arachnoid cyst. Incidentally noted 7 mm round T2/FLAIR hyperintense focus within the right parotid gland. This could reflect a prominent intraparotid lymph node. However, a primary parotid neoplasm cannot be excluded. Nonemergent targeted ultrasound recommended for further characterization. Paranasal sinus disease, as described.  Electronically Signed   By: Jackey Loge DO   On: 06/15/2020 18:47   ECHOCARDIOGRAM COMPLETE BUBBLE STUDY  Result Date: 06/16/2020    ECHOCARDIOGRAM REPORT   Patient Name:   Sharon Gray Date of Exam: 06/16/2020 Medical Rec #:  [id number]   Height:       65.0 in Accession #:    [id number]  Weight:       177.7 lb Date of Birth:  02-07-1965    BSA:          1.881 m Patient Age:    55 years    BP:           111/57 mmHg Patient Gender: F           HR:           86 bpm. Exam Location:  [facility] Procedure: 2D Echo, Cardiac Doppler, Color Doppler and Saline Contrast Bubble            Study Indications:     Stroke 434.91 /I63.9  History:         Patient has no prior history of Echocardiogram examinations.                  Risk Factors:Hypertension and Diabetes.  Sonographer:     Cristela Blue RDCS (AE) Referring Phys:  [id number] Malachi Carl STACK Diagnosing Phys: Julien Nordmann MD IMPRESSIONS  1. Left ventricular ejection fraction, by estimation, is 60 to 65%. The left ventricle has normal function. The left ventricle has no regional wall motion abnormalities. Left ventricular diastolic parameters were normal.  2. Right ventricular systolic function is normal. The right ventricular size is normal. There is normal pulmonary artery systolic pressure. The estimated right ventricular systolic pressure is 22.3 mmHg. FINDINGS  Left Ventricle: Left ventricular ejection fraction, by estimation, is 60 to 65%. The left ventricle has normal function. The left ventricle has no regional wall motion abnormalities. The left ventricular internal cavity size was normal in size. There is  no left ventricular hypertrophy. Left ventricular diastolic parameters were normal. Right Ventricle: The right ventricular size is normal. No increase in right ventricular wall thickness. Right ventricular systolic function is normal. There is normal pulmonary artery systolic pressure. The tricuspid regurgitant velocity is 2.08 m/s, and  with an assumed right atrial  pressure of 5 mmHg, the estimated right ventricular systolic pressure is 22.3 mmHg. Left Atrium: Left atrial size was normal in size. Right Atrium: Right atrial size was normal in size. Pericardium: There is no evidence of pericardial effusion. Mitral Valve: The mitral valve is normal in structure. No evidence of mitral valve regurgitation. No evidence of mitral valve stenosis. Tricuspid Valve: The tricuspid valve is normal in structure. Tricuspid valve regurgitation is mild . No evidence of tricuspid stenosis. Aortic Valve: The aortic valve is normal in structure. Aortic valve regurgitation is not visualized. No aortic stenosis is present. Aortic valve mean gradient measures 6.0 mmHg. Aortic valve peak gradient measures 10.4 mmHg. Aortic valve area, by VTI measures 2.59 cm. Pulmonic Valve: The pulmonic valve was normal in structure. Pulmonic valve regurgitation is not visualized. No evidence of pulmonic stenosis. Aorta: The aortic root is normal in size and structure. Venous: The inferior vena cava is normal in size with greater than 50% respiratory variability, suggesting right atrial pressure of 3 mmHg. IAS/Shunts: No atrial level shunt detected by color flow Doppler. Agitated saline contrast was given intravenously to evaluate for intracardiac shunting. Agitated saline contrast bubble study was negative, with no evidence of any interatrial shunt.  LEFT VENTRICLE PLAX 2D LVIDd:         4.16 cm  Diastology LVIDs:         2.30 cm  LV e' medial:    11.00 cm/s LV PW:         1.09 cm  LV E/e' medial:  9.0 LV IVS:        0.78 cm  LV e' lateral:   15.90 cm/s LVOT diam:     2.00 cm  LV E/e' lateral: 6.2 LV SV:         79 LV SV Index:   42 LVOT Area:     3.14 cm  RIGHT VENTRICLE RV Basal diam:  1.89 cm RV S prime:     12.00 cm/s TAPSE (M-mode): 4.0 cm LEFT ATRIUM             Index       RIGHT ATRIUM           Index LA diam:        3.30 cm 1.75 cm/m  RA Area:     11.70 cm LA Vol (A2C):   53.4 ml 28.39 ml/m RA Volume:    23.70 ml  12.60 ml/m LA Vol (A4C):   43.1 ml 22.91 ml/m LA Biplane Vol: 48.2 ml 25.62 ml/m  AORTIC VALVE                    PULMONIC VALVE AV Area (Vmax):    2.07 cm     PV Vmax:  0.77 m/s AV Area (Vmean):   2.35 cm     PV Peak grad:   2.4 mmHg AV Area (VTI):     2.59 cm     RVOT Peak grad: 5 mmHg AV Vmax:           161.00 cm/s AV Vmean:          112.000 cm/s AV VTI:            0.304 m AV Peak Grad:      10.4 mmHg AV Mean Grad:      6.0 mmHg LVOT Vmax:         106.00 cm/s LVOT Vmean:        83.800 cm/s LVOT VTI:          0.251 m LVOT/AV VTI ratio: 0.83  AORTA Ao Root diam: 3.10 cm MITRAL VALVE                TRICUSPID VALVE MV Area (PHT): 5.62 cm     TR Peak grad:   17.3 mmHg MV Decel Time: 135 msec     TR Vmax:        208.00 cm/s MV E velocity: 98.50 cm/s MV A velocity: 104.00 cm/s  SHUNTS MV E/A ratio:  0.95         Systemic VTI:  0.25 m                             Systemic Diam: 2.00 cm Julien Nordmann MD Electronically signed by Julien Nordmann MD Signature Date/Time: 06/16/2020/6:04:35 PM    Final      Assessment/Plan 1.  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  The patient now has progressed and has a lesion the is >90%.  Patient's MR angiography of the carotid arteries confirms 90% right ICA stenosis.  The anatomical considerations support stenting over surgery.  This was discussed in detail with the patient.  The risks, benefits and alternative therapies were reviewed in detail with the patient.  All questions were answered.  The patient agrees to proceed with stenting of the right carotid artery.  This will occur after 2 to 3 weeks to allow for healing of her infarcts.  She will be discharged and maintained on dual antiplatelet therapy which will be continued through stenting.  Continue antiplatelet therapy as prescribed. Continue management of CAD, HTN and  Hyperlipidemia. Healthy heart diet, encouraged exercise at least 4 times per week.   2.  Diabetes mellitus: Continue hypoglycemic medications as already ordered, these medications have been reviewed and there are no changes at this time.  Hgb A1C to be monitored as already arranged by primary service.  3.  Hypertension: Continue antihypertensive medications as already ordered, these medications have been reviewed and there are no changes at this time.     [name], MD  06/16/2020 6:40 PM

## 2020-06-16 NOTE — Progress Notes (Signed)
*  PRELIMINARY RESULTS* Echocardiogram 2D Echocardiogram has been performed.  Cristela Blue 06/16/2020, 9:59 AM

## 2020-06-16 NOTE — Plan of Care (Signed)
Problem: Education: Goal: Knowledge of General Education information will improve Description: Including pain rating scale, medication(s)/side effects and non-pharmacologic comfort measures 06/16/2020 0658 by Lucky Cowboy, RN Outcome: Progressing 06/16/2020 0656 by Lucky Cowboy, RN Outcome: Progressing   Problem: Health Behavior/Discharge Planning: Goal: Ability to manage health-related needs will improve 06/16/2020 0658 by Lucky Cowboy, RN Outcome: Progressing 06/16/2020 0656 by Lucky Cowboy, RN Outcome: Progressing   Problem: Clinical Measurements: Goal: Ability to maintain clinical measurements within normal limits will improve 06/16/2020 0658 by Lucky Cowboy, RN Outcome: Progressing 06/16/2020 0656 by Marinus Maw D, RN Outcome: Progressing Goal: Will remain free from infection 06/16/2020 0658 by Lucky Cowboy, RN Outcome: Progressing 06/16/2020 0656 by Marinus Maw D, RN Outcome: Progressing Goal: Diagnostic test results will improve 06/16/2020 0658 by Lucky Cowboy, RN Outcome: Progressing 06/16/2020 0656 by Marinus Maw D, RN Outcome: Progressing Goal: Respiratory complications will improve 06/16/2020 0658 by Lucky Cowboy, RN Outcome: Progressing 06/16/2020 0656 by Marinus Maw D, RN Outcome: Progressing Goal: Cardiovascular complication will be avoided 06/16/2020 0658 by Lucky Cowboy, RN Outcome: Progressing 06/16/2020 0656 by Lucky Cowboy, RN Outcome: Progressing   Problem: Activity: Goal: Risk for activity intolerance will decrease 06/16/2020 0658 by Marinus Maw D, RN Outcome: Progressing 06/16/2020 0656 by Lucky Cowboy, RN Outcome: Progressing   Problem: Nutrition: Goal: Adequate nutrition will be maintained 06/16/2020 0658 by Marinus Maw D, RN Outcome: Progressing 06/16/2020 0656 by Lucky Cowboy, RN Outcome: Progressing   Problem: Coping: Goal: Level of anxiety will decrease 06/16/2020 0658 by Marinus Maw D, RN Outcome:  Progressing 06/16/2020 0656 by Lucky Cowboy, RN Outcome: Progressing   Problem: Elimination: Goal: Will not experience complications related to bowel motility 06/16/2020 0658 by Lucky Cowboy, RN Outcome: Progressing 06/16/2020 0656 by Lucky Cowboy, RN Outcome: Progressing Goal: Will not experience complications related to urinary retention 06/16/2020 0658 by Lucky Cowboy, RN Outcome: Progressing 06/16/2020 0656 by Lucky Cowboy, RN Outcome: Progressing   Problem: Pain Managment: Goal: General experience of comfort will improve 06/16/2020 0658 by Lucky Cowboy, RN Outcome: Progressing 06/16/2020 0656 by Lucky Cowboy, RN Outcome: Progressing   Problem: Safety: Goal: Ability to remain free from injury will improve 06/16/2020 0658 by Lucky Cowboy, RN Outcome: Progressing 06/16/2020 0656 by Lucky Cowboy, RN Outcome: Progressing   Problem: Skin Integrity: Goal: Risk for impaired skin integrity will decrease 06/16/2020 0658 by Lucky Cowboy, RN Outcome: Progressing 06/16/2020 0656 by Lucky Cowboy, RN Outcome: Progressing   Problem: Education: Goal: Knowledge of secondary prevention will improve Outcome: Progressing Goal: Knowledge of patient specific risk factors addressed and post discharge goals established will improve Outcome: Progressing Goal: Individualized Educational Video(s) Outcome: Progressing   Problem: Coping: Goal: Will identify appropriate support needs Outcome: Progressing   Problem: Health Behavior/Discharge Planning: Goal: Ability to manage health-related needs will improve Outcome: Progressing   Problem: Self-Care: Goal: Verbalization of feelings and concerns over difficulty with self-care will improve Outcome: Progressing Goal: Ability to communicate needs accurately will improve Outcome: Progressing   Problem: Nutrition: Goal: Risk of aspiration will decrease Outcome: Progressing Goal: Dietary intake will improve Outcome:  Progressing   Problem: Intracerebral Hemorrhage Tissue Perfusion: Goal: Complications of Intracerebral Hemorrhage will be minimized Outcome: Progressing   Problem: Ischemic Stroke/TIA Tissue Perfusion: Goal: Complications of ischemic stroke/TIA will be minimized Outcome: Progressing   Problem: Spontaneous Subarachnoid Hemorrhage Tissue Perfusion: Goal: Complications of Spontaneous Subarachnoid Hemorrhage will be minimized Outcome:  Progressing

## 2020-06-16 NOTE — TOC Progression Note (Signed)
[facility] of [facility]) - Progression Note    Patient Details  Name: Sharon Gray MRN: [id number] Date of Birth: [birth year]  Transition of [facility]) CM/SW Contact  Caryn Section, RN Phone Number: 06/16/2020, 3:42 PM  Clinical Narrative:   Patient lives with husband and 3 minor children and they can assist if needed.  No concerns with getting prescriptions or getting to appointments.  Patient sees [facility].  No current medical services.  Does not anticipate any TOC needs at this time.  TOC contact information given, TOC to follow through discharge.         Expected Discharge Plan and Services                                                 Social Determinants of Health (SDOH) Interventions    Readmission Risk Interventions No flowsheet data found.

## 2020-06-16 NOTE — Progress Notes (Signed)
SLP Cancellation Note  Patient Details Name: Sharon Gray MRN: [id number] DOB: 10/28/1965   Cancelled treatment:       Reason Eval/Treat Not Completed: Patient at procedure or test/unavailable  Loreal Schuessler B. Dreama Saa M.S., CCC-SLP, [facility] Speech-Language Pathologist Rehabilitation Services Office [phone]  Reuel Derby 06/16/2020, 5:06 PM

## 2020-06-16 NOTE — Progress Notes (Addendum)
PROGRESS NOTE    Niralya Ohanian  ZOX:[id number] DOB: 1965/02/25 DOA: 06/15/2020 PCP: Pcp, No    Brief Narrative:  Chelsye Suhre is a 55 y.o. female with medical history significant for type 2 diabetes and hypertension who presented to the ED for evaluation of left-sided numbness.  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 Did not get vaccinated.  5/26- Pt has no sob, cp, abd pain. Feels little better, feels weakness of her left side  Consultants:   neurology  Procedures: CT and MRI  Antimicrobials:      Subjective: As above. No cp  Objective: Vitals:   06/16/20 0806 06/16/20 1150 06/16/20 1526 06/16/20 1555  BP: (!) 111/57 (!) 108/58 116/73 123/72  Pulse: 86 81 81 90  Resp: Temp: 99.4 F (37.4 C) 99.1 F (37.3 C) 98 F (36.7 C) 98.9 F (37.2 C)  TempSrc: Oral  Oral   SpO2: 98% 100% 95% 100%  Weight:      Height:        Intake/Output Summary (Last 24 hours) at 06/16/2020 1806 Last data filed at 06/16/2020 0800 Gross per 24 hour  Intake 240 ml  Output --  Net 240 ml   Filed Weights   06/16/20 0300  Weight: 80.6 kg    Examination:  General exam: Appears calm and comfortable  Respiratory system: Clear to auscultation. Respiratory effort normal. Cardiovascular system: S1 & S2 heard, RRR. No JVD, murmurs, rubs, gallops or clicks. No pedal edema. Gastrointestinal system: Abdomen is nondistended, soft and nontender. l. Normal bowel sounds heard. Central nervous system: Alert and oriented. finger to nose test stable. 5/5 RUE/RLE, 4+/5 LUE, 5/5 LLE Extremities: no edema. Psychiatry: Judgement and insight appear normal. Mood & affect appropriate.     Data Reviewed: I have personally reviewed following labs and imaging studies  CBC: Recent Labs  Lab 06/15/20 1448  WBC 9.8  NEUTROABS 5.4  HGB 11.4*  HCT 34.5*  MCV 87.1  PLT 296   Basic Metabolic Panel: Recent  Labs  Lab 06/15/20 1448  NA 138  K 4.2  CL 104  CO2 26  GLUCOSE 193*  BUN 28*  CREATININE 0.68  CALCIUM 9.1   GFR: Estimated Creatinine Clearance: 85.2 mL/min (by C-G formula based on SCr of 0.68 mg/dL). Liver Function Tests: Recent Labs  Lab 06/15/20 1448  AST 23  ALT 31  ALKPHOS 119  BILITOT 0.4  PROT 8.4*  ALBUMIN 3.7   No results for input(s): LIPASE, AMYLASE in the last 168 hours. No results for input(s): AMMONIA in the last 168 hours. Coagulation Profile: Recent Labs  Lab 06/15/20 1448  INR 1.0   Cardiac Enzymes: No results for input(s): CKTOTAL, CKMB, CKMBINDEX, TROPONINI in the last 168 hours. BNP (last 3 results) No results for input(s): PROBNP in the last 8760 hours. HbA1C: No results for input(s): HGBA1C in the last 72 hours. CBG: Recent Labs  Lab 06/15/20 1447 06/15/20 2332 06/16/20 0732 06/16/20 1232 06/16/20 1615  GLUCAP 175* 197* 182* 230* 191*   Lipid Profile: Recent Labs    06/15/20 1448 06/16/20 0505  CHOL  --  91  HDL  --  29*  LDLCALC  --  41  TRIG  --  105  CHOLHDL  --  3.1  LDLDIRECT 50.0  --    Thyroid Function Tests: No results for input(s): TSH, T4TOTAL, FREET4, T3FREE, THYROIDAB in the last 72  hours. Anemia Panel: No results for input(s): VITAMINB12, FOLATE, FERRITIN, TIBC, IRON, RETICCTPCT in the last 72 hours. Sepsis Labs: No results for input(s): PROCALCITON, LATICACIDVEN in the last 168 hours.  Recent Results (from the past 240 hour(s))  Resp Panel by RT-PCR (Flu A&B, Covid) Nasopharyngeal Swab     Status: Abnormal   Collection Time: 06/15/20  5:04 PM   Specimen: Nasopharyngeal Swab; Nasopharyngeal(NP) swabs in vial transport medium  Result Value Ref Range Status   SARS Coronavirus 2 by RT PCR POSITIVE (A) NEGATIVE Final    Comment: RESULT CALLED TO, READ BACK BY AND VERIFIED WITH: AMY COYNE @1833  ON 06/15/20 SKL (NOTE) SARS-CoV-2 target nucleic acids are DETECTED.  The SARS-CoV-2 RNA is generally detectable  in upper respiratory specimens during the acute phase of infection. Positive results are indicative of the presence of the identified virus, but do not rule out bacterial infection or co-infection with other pathogens not detected by the test. Clinical correlation with patient history and other diagnostic information is necessary to determine patient infection status. The expected result is Negative.  Fact Sheet for Patients: BloggerCourse.comhttps://www.fda.gov/media/152166/download  Fact Sheet for Healthcare Providers: SeriousBroker.ithttps://www.fda.gov/media/152162/download  This test is not yet approved or cleared by the [place] FDA and  has been authorized for detection and/or diagnosis of SARS-CoV-2 by FDA under an Emergency Use Authorization (EUA).  This EUA will remain in effect (meaning this test can be us ed) for the duration of  the COVID-19 declaration under Section 564(b)(1) of the Act, 21 U.S.C. section 360bbb-3(b)(1), unless the authorization is terminated or revoked sooner.     Influenza A by PCR NEGATIVE NEGATIVE Final   Influenza B by PCR NEGATIVE NEGATIVE Final    Comment: (NOTE) The Xpert Xpress SARS-CoV-2/FLU/RSV plus assay is intended as an aid in the diagnosis of influenza from Nasopharyngeal swab specimens and should not be used as a sole basis for treatment. Nasal washings and aspirates are unacceptable for Xpert Xpress SARS-CoV-2/FLU/RSV testing.  Fact Sheet for Patients: BloggerCourse.comhttps://www.fda.gov/media/152166/download  Fact Sheet for Healthcare Providers: SeriousBroker.ithttps://www.fda.gov/media/152162/download  This test is not yet approved or cleared by the [place] FDA and has been authorized for detection and/or diagnosis of SARS-CoV-2 by FDA under an Emergency Use Authorization (EUA). This EUA will remain in effect (meaning this test can be used) for the duration of the COVID-19 declaration under Section 564(b)(1) of the Act, 21 U.S.C. section 360bbb-3(b)(1), unless the authorization  is terminated or revoked.  Performed at [facility], [address] [place], [place] [address]          Radiology Studies: CT HEAD WO CONTRAST  Result Date: 06/15/2020 CLINICAL DATA:  Provided history: Neuro deficit, acute, stroke suspected. Additional history provided: Left-sided numbness which began last night. EXAM: CT HEAD WITHOUT CONTRAST TECHNIQUE: Contiguous axial images were obtained from the base of the skull through the vertex without intravenous contrast. COMPARISON:  No pertinent prior exams available for comparison. FINDINGS: Brain: Cerebral volume is normal. 6 mm focal hypodensity within the right frontal lobe centrum semiovale (series 3, image 19) (series 5, image 22). Retrocerebellar CSF density prominence just to the left of midline measuring 2.1 x 2.4 cm in transaxial dimensions (series 3, image 13) (series 5, image 28). This likely reflects an incidental arachnoid cyst. There is no acute intracranial hemorrhage. No demarcated cortical infarct. No extra-axial fluid collection. No evidence of intracranial mass. No midline shift. Partially empty sella turcica. Vascular: No hyperdense vessel.  Atherosclerotic calcifications. Skull: Normal. Negative for fracture or  focal lesion. Sinuses/Orbits: Visualized orbits show no acute finding. Moderate sized fluid level within the left frontal sinus. Scattered mild mucosal thickening and possible fluid within the bilateral ethmoid air cells. IMPRESSION: 6 mm hypodensity within the right frontal lobe white matter, which may reflect an age-indeterminate lacunar infarct or prominent perivascular space. Given the provided history of left-sided weakness, consider a noncontrast brain MRI for further evaluation. 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 This finding is very commonly incidental, but can be associated with idiopathic intracranial hypertension. Otherwise unremarkable non-contrast CT  appearance of the brain. Paranasal sinus disease, as described. Electronically Signed   By: Jackey Loge DO   On: 06/15/2020 15:12   MR ANGIO HEAD WO CONTRAST  Result Date: 06/15/2020 CLINICAL DATA:  Initial evaluation for acute stroke. EXAM: MRA NECK WITHOUT AND WITH CONTRAST MRA HEAD WITHOUT CONTRAST TECHNIQUE: Multiplanar and multiecho pulse sequences of the neck were obtained without and with intravenous contrast. Angiographic images of the neck were obtained using MRA technique without and with intravenous contrast; Angiographic images of the Circle of Willis were obtained using MRA technique without intravenous contrast. CONTRAST:  65mL GADAVIST GADOBUTROL 1 MMOL/ML IV SOLN COMPARISON:  Prior brain MRI from earlier the same day. FINDINGS: MRA NECK FINDINGS AORTIC ARCH: Visualized aortic arch normal caliber with normal 3 vessel morphology. No hemodynamically significant stenosis seen about the origin of the great vessels. RIGHT CAROTID SYSTEM: Right CCA patent from its origin to the bifurcation without stenosis. There is a severe 70-80% stenosis involving the proximal right ICA, just distal to the bifurcation (series 1059, image 7). Area of involvement essentially begins at the bifurcation, and measures approximately 1 cm in length. Distally, the right ICA is patent without stenosis, evidence for dissection, or occlusion. LEFT CAROTID SYSTEM: Left CCA patent from its origin to the bifurcation without stenosis. Mild atheromatous irregularity about the left carotid bulb/proximal left ICA without hemodynamically significant stenosis. Left ICA patent distally without stenosis, evidence for dissection or occlusion. VERTEBRAL ARTERIES: Both vertebral arteries arise from the subclavian arteries. Short-segment moderate approximate 50% stenosis at the origin of the right vertebral artery (series 1071, image 9). Vertebral arteries otherwise patent within the neck without stenosis, evidence for dissection, or  occlusion. MRA HEAD FINDINGS ANTERIOR CIRCULATION: Examination degraded by motion artifact. Petrous segments patent bilaterally. Diffuse atheromatous irregularity with narrowing throughout the carotid siphons. Associated moderate to severe stenosis at the anterior genu of the cavernous right ICA (series 1042, image 3). Additional multifocal severe stenoses noted about the cavernous and para clinoid left ICA (series 1042, image 15). ICA termini well perfused. A1 segments patent. Normal anterior communicating artery complex. Anterior cerebral arteries patent to their distal aspects without appreciable stenosis. No M1 stenosis or occlusion. Normal MCA bifurcations. Distal MCA branches perfused and fairly symmetric. POSTERIOR CIRCULATION: Both V4 segments patent to the vertebrobasilar junction without stenosis. Left vertebral artery dominant. Left PICA origin patent and normal. Right PICA not seen. Irregularity within the proximal and mid basilar artery could be related to atheromatous disease versus artifact. Similarly, an apparent short-segment moderate stenosis at the distal basilar artery could be related to artifact or atherosclerotic disease (sees series 1052, image 10). Superior cerebellar arteries patent proximally. Both PCAs primarily supplied via the basilar. Short-segment moderate stenosis at the left P1/P2 junction (series 1052, image 10). PCAs otherwise well perfused to their distal aspects without stenosis. No intracranial aneurysm. IMPRESSION: MRA HEAD IMPRESSION: 1. Motion degraded exam. 2. Atheromatous irregularity throughout the carotid siphons  with associated moderate to severe stenoses, left worse than right. 3. Short-segment moderate stenosis at the left P1/P2 junction. 4. Atheromatous irregularity versus artifact and possible short-segment moderate stenosis at the distal basilar artery as above. MRA NECK IMPRESSION: 1. 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 Finding is likely the source of the acute watershed infarcts seen on prior brain MRI. Both carotid artery systems otherwise widely patent within the neck. 2. Short-segment moderate approximate 50% stenosis at the origin of the right vertebral artery. Vertebral arteries otherwise patent within the neck Electronically Signed   By: Rise Mu M.D.   On: 06/15/2020 23:23   MR ANGIO NECK W WO CONTRAST  Result Date: 06/15/2020 CLINICAL DATA:  Initial evaluation for acute stroke. EXAM: MRA NECK WITHOUT AND WITH CONTRAST MRA HEAD WITHOUT CONTRAST TECHNIQUE: Multiplanar and multiecho pulse sequences of the neck were obtained without and with intravenous contrast. Angiographic images of the neck were obtained using MRA technique without and with intravenous contrast; Angiographic images of the Circle of Willis were obtained using MRA technique without intravenous contrast. CONTRAST:  7mL GADAVIST GADOBUTROL 1 MMOL/ML IV SOLN COMPARISON:  Prior brain MRI from earlier the same day. FINDINGS: MRA NECK FINDINGS AORTIC ARCH: Visualized aortic arch normal caliber with normal 3 vessel morphology. No hemodynamically significant stenosis seen about the origin of the great vessels. RIGHT CAROTID SYSTEM: Right CCA patent from its origin to the bifurcation without stenosis. There is a severe 70-80% stenosis involving the proximal right ICA, just distal to the bifurcation (series 1059, image 7). Area of involvement essentially begins at the bifurcation, and measures approximately 1 cm in length. Distally, the right ICA is patent without stenosis, evidence for dissection, or occlusion. LEFT CAROTID SYSTEM: Left CCA patent from its origin to the bifurcation without stenosis. Mild atheromatous irregularity about the left carotid bulb/proximal left ICA without hemodynamically significant stenosis. Left ICA patent distally without stenosis, evidence for dissection or occlusion. VERTEBRAL ARTERIES: Both vertebral  arteries arise from the subclavian arteries. Short-segment moderate approximate 50% stenosis at the origin of the right vertebral artery (series 1071, image 9). Vertebral arteries otherwise patent within the neck without stenosis, evidence for dissection, or occlusion. MRA HEAD FINDINGS ANTERIOR CIRCULATION: Examination degraded by motion artifact. Petrous segments patent bilaterally. Diffuse atheromatous irregularity with narrowing throughout the carotid siphons. Associated moderate to severe stenosis at the anterior genu of the cavernous right ICA (series 1042, image 3). Additional multifocal severe stenoses noted about the cavernous and para clinoid left ICA (series 1042, image 15). ICA termini well perfused. A1 segments patent. Normal anterior communicating artery complex. Anterior cerebral arteries patent to their distal aspects without appreciable stenosis. No M1 stenosis or occlusion. Normal MCA bifurcations. Distal MCA branches perfused and fairly symmetric. POSTERIOR CIRCULATION: Both V4 segments patent to the vertebrobasilar junction without stenosis. Left vertebral artery dominant. Left PICA origin patent and normal. Right PICA not seen. Irregularity within the proximal and mid basilar artery could be related to atheromatous disease versus artifact. Similarly, an apparent short-segment moderate stenosis at the distal basilar artery could be related to artifact or atherosclerotic disease (sees series 1052, image 10). Superior cerebellar arteries patent proximally. Both PCAs primarily supplied via the basilar. Short-segment moderate stenosis at the left P1/P2 junction (series 1052, image 10). PCAs otherwise well perfused to their distal aspects without stenosis. No intracranial aneurysm. IMPRESSION: MRA HEAD IMPRESSION: 1. Motion degraded exam. 2. Atheromatous irregularity throughout the carotid siphons with associated moderate to severe  stenoses, left worse than right. 3. Short-segment moderate stenosis at  the left P1/P2 junction. 4. Atheromatous irregularity versus artifact and possible short-segment moderate stenosis at the distal basilar artery as above. MRA NECK IMPRESSION: 1. 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 Finding is likely the source of the acute watershed infarcts seen on prior brain MRI. Both carotid artery systems otherwise widely patent within the neck. 2. Short-segment moderate approximate 50% stenosis at the origin of the right vertebral artery. Vertebral arteries otherwise patent within the neck Electronically Signed   By: Rise Mu M.D.   On: 06/15/2020 23:23   MR BRAIN WO CONTRAST  Result Date: 06/15/2020 CLINICAL DATA:  Brain mass or lesion. EXAM: MRI HEAD WITHOUT CONTRAST TECHNIQUE: Multiplanar, multiecho pulse sequences of the brain and surrounding structures were obtained without intravenous contrast. COMPARISON:  No pertinent prior exams available for comparison. FINDINGS: Brain: Cerebral volume is normal. There are patchy small acute cortical/subcortical infarcts within the right MCA territory, as well as right MCA/ACA and MCA/PCA watershed territories, within the right frontal and parietal lobes. No evidence of intracranial mass. No chronic intracranial blood products. No extra-axial fluid collection. No midline shift. CSF intensity prominence posterior to the cerebellum just to the left of midline measuring 2.1 x 2.5 cm in transaxial dimensions, likely reflecting an incidental arachnoid cyst. Vascular: Expected proximal arterial flow voids. Skull and upper cervical spine: No focal marrow lesion. Sinuses/Orbits: Visualized orbits show no acute finding. Mild left greater than right frontal and bilateral ethmoid sinus mucosal thickening. Trace mucosal thickening is per also present within the bilateral sphenoid sinuses. Other: 7 mm round T2/FLAIR hyperintense focus within the right parotid gland (series 15, image 3).  IMPRESSION: Patchy small acute cortical/subcortical infarcts within the right MCA territory, as well as right MCA/ACA and MCA/PCA watershed territories. These infarcts are present within the right frontal and parietal lobes. Consider MR or CT angiography of the head/neck for further evaluation. Incidentally noted left retrocerebellar arachnoid cyst. Incidentally noted 7 mm round T2/FLAIR hyperintense focus within the right parotid gland. This could reflect a prominent intraparotid lymph node. However, a primary parotid neoplasm cannot be excluded. Nonemergent targeted ultrasound recommended for further characterization. Paranasal sinus disease, as described. Electronically Signed   By: Jackey Loge DO   On: 06/15/2020 18:47   ECHOCARDIOGRAM COMPLETE BUBBLE STUDY  Result Date: 06/16/2020    ECHOCARDIOGRAM REPORT   Patient Name:   Arriyanna Moure Date of Exam: 06/16/2020 Medical Rec #:  [id number]   Height:       65.0 in Accession #:    [id number]  Weight:       177.7 lb Date of Birth:  1965/03/17    BSA:          1.881 m Patient Age:    55 years    BP:           111/57 mmHg Patient Gender: F           HR:           86 bpm. Exam Location:  [facility] Procedure: 2D Echo, Cardiac Doppler, Color Doppler and Saline Contrast Bubble            Study Indications:     Stroke 434.91 /I63.9  History:         Patient has no prior history of Echocardiogram examinations.                  Risk Factors:Hypertension  and Diabetes.  Sonographer:     Cristela Blue RDCS (AE) Referring Phys:  [id number] Malachi Carl STACK Diagnosing Phys: Julien Nordmann MD IMPRESSIONS  1. Left ventricular ejection fraction, by estimation, is 60 to 65%. The left ventricle has normal function. The left ventricle has no regional wall motion abnormalities. Left ventricular diastolic parameters were normal.  2. Right ventricular systolic function is normal. The right ventricular size is normal. There is normal pulmonary artery systolic pressure. The estimated right ventricular  systolic pressure is 22.3 mmHg. FINDINGS  Left Ventricle: Left ventricular ejection fraction, by estimation, is 60 to 65%. The left ventricle has normal function. The left ventricle has no regional wall motion abnormalities. The left ventricular internal cavity size was normal in size. There is  no left ventricular hypertrophy. Left ventricular diastolic parameters were normal. Right Ventricle: The right ventricular size is normal. No increase in right ventricular wall thickness. Right ventricular systolic function is normal. There is normal pulmonary artery systolic pressure. The tricuspid regurgitant velocity is 2.08 m/s, and  with an assumed right atrial pressure of 5 mmHg, the estimated right ventricular systolic pressure is 22.3 mmHg. Left Atrium: Left atrial size was normal in size. Right Atrium: Right atrial size was normal in size. Pericardium: There is no evidence of pericardial effusion. Mitral Valve: The mitral valve is normal in structure. No evidence of mitral valve regurgitation. No evidence of mitral valve stenosis. Tricuspid Valve: The tricuspid valve is normal in structure. Tricuspid valve regurgitation is mild . No evidence of tricuspid stenosis. Aortic Valve: The aortic valve is normal in structure. Aortic valve regurgitation is not visualized. No aortic stenosis is present. Aortic valve mean gradient measures 6.0 mmHg. Aortic valve peak gradient measures 10.4 mmHg. Aortic valve area, by VTI measures 2.59 cm. Pulmonic Valve: The pulmonic valve was normal in structure. Pulmonic valve regurgitation is not visualized. No evidence of pulmonic stenosis. Aorta: The aortic root is normal in size and structure. Venous: The inferior vena cava is normal in size with greater than 50% respiratory variability, suggesting right atrial pressure of 3 mmHg. IAS/Shunts: No atrial level shunt detected by color flow Doppler. Agitated saline contrast was given intravenously to evaluate for intracardiac shunting.  Agitated saline contrast bubble study was negative, with no evidence of any interatrial shunt.  LEFT VENTRICLE PLAX 2D LVIDd:         4.16 cm  Diastology LVIDs:         2.30 cm  LV e' medial:    11.00 cm/s LV PW:         1.09 cm  LV E/e' medial:  9.0 LV IVS:        0.78 cm  LV e' lateral:   15.90 cm/s LVOT diam:     2.00 cm  LV E/e' lateral: 6.2 LV SV:         79 LV SV Index:   42 LVOT Area:     3.14 cm  RIGHT VENTRICLE RV Basal diam:  1.89 cm RV S prime:     12.00 cm/s TAPSE (M-mode): 4.0 cm LEFT ATRIUM             Index       RIGHT ATRIUM           Index LA diam:        3.30 cm 1.75 cm/m  RA Area:     11.70 cm LA Vol (A2C):   53.4 ml 28.39 ml/m RA Volume:   23.70 ml  12.60 ml/m LA Vol (A4C):   43.1 ml 22.91 ml/m LA Biplane Vol: 48.2 ml 25.62 ml/m  AORTIC VALVE                    PULMONIC VALVE AV Area (Vmax):    2.07 cm     PV Vmax:        0.77 m/s AV Area (Vmean):   2.35 cm     PV Peak grad:   2.4 mmHg AV Area (VTI):     2.59 cm     RVOT Peak grad: 5 mmHg AV Vmax:           161.00 cm/s AV Vmean:          112.000 cm/s AV VTI:            0.304 m AV Peak Grad:      10.4 mmHg AV Mean Grad:      6.0 mmHg LVOT Vmax:         106.00 cm/s LVOT Vmean:        83.800 cm/s LVOT VTI:          0.251 m LVOT/AV VTI ratio: 0.83  AORTA Ao Root diam: 3.10 cm MITRAL VALVE                TRICUSPID VALVE MV Area (PHT): 5.62 cm     TR Peak grad:   17.3 mmHg MV Decel Time: 135 msec     TR Vmax:        208.00 cm/s MV E velocity: 98.50 cm/s MV A velocity: 104.00 cm/s  SHUNTS MV E/A ratio:  0.95         Systemic VTI:  0.25 m                             Systemic Diam: 2.00 cm Julien Nordmann MD Electronically signed by Julien Nordmann MD Signature Date/Time: 06/16/2020/6:04:35 PM    Final         Scheduled Meds: .  stroke: mapping our early stages of recovery book   Does not apply Once  . aspirin  81 mg Oral Daily   Or  . aspirin  300 mg Rectal Daily  . clopidogrel  75 mg Oral Daily  . enoxaparin (LOVENOX) injection  40  mg Subcutaneous Q24H  . insulin aspart  0-9 Units Subcutaneous TID WC  . rosuvastatin  40 mg Oral Daily   Continuous Infusions:  Assessment & Plan:   Principal Problem:   Acute CVA (cerebrovascular accident) (HCC) Active Problems:   Type 2 diabetes mellitus (HCC)   Hypertension associated with diabetes (HCC)   Nickola Lenig is a 55 y.o. female with medical history significant for type 2 diabetes and hypertension who is admitted with acute CVA.  Acute CVA: Presenting with left-sided numbness with some LLE weakness.  Acute cortical/subcortical watershed infarcts in the right MCA territory seen on MRI brain. -Neurology consulted pending MRA h/n -see report Echo nml ef, no clot, neg. Bubble study Continue on asa, plavix Goal LDL <7 Ck A1c Continue statin Vascular surgery consulted this am. Allow permissive HTN  Incidental COVID-19 positive test: + screening. Currently asx Will monitor Not vaccinated   Type 2 diabetes: Holding glipizide Ck A1c bg stable overall Continue riss   Hypertension: Allow permissive HTN Iv labetalol prn   Right parotid gland focus: Incidentally noted 7 mm round hyperintense focus within the right parotid gland noted  on MRI brain.  Nonemergent ultrasound recommended for further characterization.   DVT prophylaxis: lovenox Code Status:dnr Family Communication: none at bedside Status is: Observation  The patient remains OBS appropriate and will d/c before 2 midnights.  Dispo: The patient is from: Home              Anticipated d/c is to: Home              Patient currently is not medically stable to d/c.   Difficult to place patient No            LOS: 0 days   35 min with >50% on coc    Lynn Ito, MD [facility] Pager [phone]  If 7PM-7AM, please contact night-coverage 06/16/2020, 6:06 PM

## 2020-06-16 NOTE — Evaluation (Signed)
Physical Therapy Evaluation Patient Details Name: Sharon Gray MRN: [id number] DOB: 10-Nov-1965 Today's Date: 06/16/2020   History of Present Illness  Pt is 55 y/o female with recent MRI brain without contrast showed patchy small acute cortical/subcortical infarct within the right MCA territory, as well as right MCA/ACA and MCA/PCA watershed territories.  Infarcts are present within the right frontal and parietal lobes.  Clinical Impression  Patient supine in bed upon arrival to room; alert and oriented, follows commands and eager for participation with evaluation.  Limited insight into deficits and relationship to current hospitalization.  Continues to endorse and demonstrate weakness, coordination deficits and sensory deficits in L hemi-body (see details below); does feel it has improved since admission. Inattention to L hemi-body and visual field apparent throughout session; consistent cues for visual scanning and awareness of environment to optimize safety.  Able to complete bed mobility with mod indep; sit/stand, basic transfers and gait (50' x2) without assist device, cga/min assist.  Demonstrates reciprocal stepping pattern, but with excessive weight shift to L LE throughout gait cycle; limited trunk rotation and L UE arm swing; decreased visual attention/awareness to L visual field.  Mild/mod sway with dynamic gait components, requiring min assist to recover balance at times. BERG balance scale 33/56; noted deficits in narrowed/altered BOS, modified SLS.  Balance improves with focused effort and attention, but often requires cuing to minimize distraction/maximize attention. Would benefit from skilled PT to address above deficits and promote optimal return to PLOF.; recommend transition to home with outpatient PT follow up.    Follow Up Recommendations Outpatient PT    Equipment Recommendations       Recommendations for Other Services       Precautions / Restrictions  Precautions Precautions: Fall Restrictions Weight Bearing Restrictions: No      Mobility  Bed Mobility Overal bed mobility: Modified Independent Bed Mobility: Supine to Sit     Supine to sit: Modified independent (Device/Increase time) Sit to supine: Supervision   General bed mobility comments: min cuing for safety and technique but no physical assist    Transfers Overall transfer level: Needs assistance Equipment used: None Transfers: Sit to/from Stand Sit to Stand: Min guard;Supervision Stand pivot transfers: Min guard;Min assist       General transfer comment: excessive weight shift to L LE  Ambulation/Gait Ambulation/Gait assistance: Min guard Gait Distance (Feet):  (50' x2) Assistive device: None       General Gait Details: reciprocal stepping pattern, but with excessive weight shift to L LE throughout gait cycle; limited trunk rotation and L UE arm swing; decreased visual attention/awareness to L visual field.  Mild/mod sway with dynamic gait components, requiring min assist to recover balance at times.  Stairs            Wheelchair Mobility    Modified Rankin (Stroke Patients Only)       Balance Overall balance assessment: Needs assistance Sitting-balance support: No upper extremity supported;Feet supported Sitting balance-Leahy Scale: Good Sitting balance - Comments: no LOB   Standing balance support: No upper extremity supported Standing balance-Leahy Scale: Fair Standing balance comment: reliance on support from therapist                 Standardized Balance Assessment Standardized Balance Assessment : Berg Balance Test Berg Balance Test Sit to Stand: Able to stand without using hands and stabilize independently Standing Unsupported: Able to stand safely 2 minutes Sitting with Back Unsupported but Feet Supported on Floor or Stool: Able to sit safely  and securely 2 minutes Stand to Sit: Sits safely with minimal use of  hands Transfers: Able to transfer safely, definite need of hands Standing Unsupported with Eyes Closed: Able to stand 10 seconds with supervision Standing Ubsupported with Feet Together: Needs help to attain position but able to stand for 30 seconds with feet together From Standing, Reach Forward with Outstretched Arm: Reaches forward but needs supervision From Standing Position, Pick up Object from Floor: Able to pick up shoe, needs supervision From Standing Position, Turn to Look Behind Over each Shoulder: Looks behind one side only/other side shows less weight shift Turn 360 Degrees: Needs close supervision or verbal cueing Standing Unsupported, Alternately Place Feet on Step/Stool: Needs assistance to keep from falling or unable to try Standing Unsupported, One Foot in Front: Needs help to step but can hold 15 seconds Standing on One Leg: Tries to lift leg/unable to hold 3 seconds but remains standing independently Total Score: 33         Pertinent Vitals/Pain Pain Assessment: No/denies pain    Home Living Family/patient expects to be discharged to:: Private residence Living Arrangements: Spouse/significant other;Children Available Help at Discharge: Family;Available 24 hours/day Type of Home: House Home Access: Level entry     Home Layout: Able to live on main level with bedroom/bathroom;Two level Home Equipment: None;Other (comment) Additional Comments: "walking stick"    Prior Function Level of Independence: Independent         Comments: Pt reports living at home with several children and husband. She is I without use of AD for functional mobility, ADLs, and IADLs at baseline.     Hand Dominance   Dominant Hand: Right    Extremity/Trunk Assessment   Upper Extremity Assessment Upper Extremity Assessment:  (L UE grossky 4-/5, decreased coordination and fine motor control; increased time/effort for functional use (at times, requiring visual confirmation of  use)) LUE Deficits / Details: Pt able to utilize in functional manner but decreased dexterity, speed, and inattention to L side. LUE Sensation: decreased proprioception LUE Coordination: decreased fine motor    Lower Extremity Assessment Lower Extremity Assessment:  (L LE grossly 4-/5, generalized paresthesia throughout)    Cervical / Trunk Assessment Cervical / Trunk Assessment: Normal  Communication   Communication: No difficulties  Cognition Arousal/Alertness: Awake/alert Behavior During Therapy: WFL for tasks assessed/performed Overall Cognitive Status: Within Functional Limits for tasks assessed                                 General Comments: Inconsistent insight into deficits      General Comments      Exercises Other Exercises Other Exercises: Reviewed strategies to optimize gait mechanics and overall safety (slower, more purposeful gait speed; minimizing dual-task activities with gait); reviewed importance of L visual field scanning for awareness and safety.  Patient voiced understanding of all information.   Assessment/Plan    PT Assessment Patient needs continued PT services  PT Problem List Decreased strength;Decreased activity tolerance;Decreased balance;Decreased mobility;Decreased knowledge of use of DME;Decreased safety awareness;Decreased knowledge of precautions;Decreased coordination;Cardiopulmonary status limiting activity;Impaired sensation       PT Treatment Interventions DME instruction;Gait training;Stair training;Functional mobility training;Therapeutic activities;Balance training;Patient/family education;Therapeutic exercise;Neuromuscular re-education    PT Goals (Current goals can be found in the Care Plan section)  Acute Rehab PT Goals Patient Stated Goal: to go home PT Goal Formulation: With patient Time For Goal Achievement: 06/30/20 Potential to Achieve Goals: Good  Frequency 7X/week   Barriers to discharge         Co-evaluation               AM-PAC PT "6 Clicks" Mobility  Outcome Measure Help needed turning from your back to your side while in a flat bed without using bedrails?: None Help needed moving from lying on your back to sitting on the side of a flat bed without using bedrails?: None Help needed moving to and from a bed to a chair (including a wheelchair)?: A Little Help needed standing up from a chair using your arms (e.g., wheelchair or bedside chair)?: A Little Help needed to walk in hospital room?: A Little Help needed climbing 3-5 steps with a railing? : A Little 6 Click Score: 20    End of Session Equipment Utilized During Treatment: Gait belt Activity Tolerance: Patient tolerated treatment well Patient left: in chair;with call bell/phone within reach Nurse Communication: Mobility status PT Visit Diagnosis: Muscle weakness (generalized) (M62.81);Difficulty in walking, not elsewhere classified (R26.2);Other symptoms and signs involving the nervous system (R29.898);Hemiplegia and hemiparesis Hemiplegia - Right/Left: Left Hemiplegia - dominant/non-dominant: Non-dominant Hemiplegia - caused by: Cerebral infarction    Time: 6073-7106 PT Time Calculation (min) (ACUTE ONLY): 27 min   Charges:   PT Evaluation $PT Eval Moderate Complexity: 1 Mod PT Treatments $Neuromuscular Re-education: 8-22 mins        Tahiry Spicer H. Manson Passey, PT, DPT, NCS 06/16/20, 1:47 PM [phone]

## 2020-06-16 NOTE — Plan of Care (Signed)

## 2020-06-16 NOTE — Evaluation (Signed)
Occupational Therapy Evaluation Patient Details Name: Sharon Gray MRN: [id number] DOB: [birth year] Today's Date: 06/16/2020    History of Present Illness Pt is 55 y/o female with recent MRI brain without contrast showed patchy small acute cortical/subcortical infarct within the right MCA territory, as well as right MCA/ACA and MCA/PCA watershed territories.  Infarcts are present within the right frontal and parietal lobes.   Clinical Impression   Patient presenting with decreased I in self care, balance, functional mobility/transfers, endurance,L UE/LE coordination and strength and safety awareness. Patient reports I at home without use of AD and living with family PTA. Pt's youngest child is 54 and other children in 20's with retired husband available 24/7 per pt report. Pt with increased impulsivity this session and laughing/making light of deficits when discussed with pt. Pt required min A for functional mobility/transfers and LB self care. Pt able to stand at sink and brush teeth with close supervision. OT noted pt with decreased dexterity and speed in L hand. Inattention to the L side and with ambulation walking very close to items on L side. OT will assess vision further as well. OT recommended outpt unless pt does not have transportation than HHOT will be fine.  Patient will benefit from acute OT to increase overall independence in the areas of ADLs, functional mobility, and safety awareness in order to safely discharge home with family.    Follow Up Recommendations  Outpatient OT;Supervision/Assistance - 24 hour    Equipment Recommendations  None recommended by OT       Precautions / Restrictions Precautions Precautions: Fall      Mobility Bed Mobility Overal bed mobility: Needs Assistance Bed Mobility: Sit to Supine;Supine to Sit     Supine to sit: Supervision Sit to supine: Supervision   General bed mobility comments: min cuing for safety and technique but no physical assist     Transfers Overall transfer level: Needs assistance Equipment used: 1 person hand held assist Transfers: Sit to/from Stand;Stand Pivot Transfers Sit to Stand: Min guard Stand pivot transfers: Min guard;Min assist       General transfer comment: unsteady on feet and reports numbness in L LE.    Balance Overall balance assessment: Needs assistance Sitting-balance support: Feet supported Sitting balance-Leahy Scale: Good Sitting balance - Comments: no LOB   Standing balance support: During functional activity Standing balance-Leahy Scale: Fair Standing balance comment: reliance on support from therapist                           ADL either performed or assessed with clinical judgement   ADL Overall ADL's : Needs assistance/impaired Eating/Feeding: Set up;Sitting   Grooming: Supervision/safety;Standing;Cueing for safety               Lower Body Dressing: Set up;Supervision/safety;Sitting/lateral leans   Toilet Transfer: Minimal assistance Toilet Transfer Details (indicate cue type and reason): min HHA         Functional mobility during ADLs: Min guard;Minimal assistance       Vision Patient Visual Report: No change from baseline              Pertinent Vitals/Pain Pain Assessment: No/denies pain     Hand Dominance Right   Extremity/Trunk Assessment Upper Extremity Assessment Upper Extremity Assessment: LUE deficits/detail LUE Deficits / Details: Pt able to utilize in functional manner but decreased dexterity, speed, and inattention to L side. LUE Sensation: decreased proprioception LUE Coordination: decreased fine motor   Lower Extremity  Assessment Lower Extremity Assessment: Defer to PT evaluation   Cervical / Trunk Assessment Cervical / Trunk Assessment: Normal   Communication Communication Communication: No difficulties   Cognition Arousal/Alertness: Awake/alert Behavior During Therapy: Restless;Impulsive Overall Cognitive  Status: No family/caregiver present to determine baseline cognitive functioning                                 General Comments: Pt needing min cuing for safety and increased time to process information. Pt giving small pieces of information and needing verbal guidance cuing to answer questions for therapist to understand the "big picture" of home situation. She first reports 3 children, then 4 children, and after other questions reports husband living at home.              Home Living Family/patient expects to be discharged to:: Private residence Living Arrangements: Spouse/significant other;Children Available Help at Discharge: Family;Available 24 hours/day Type of Home: House Home Access: Level entry     Home Layout: Able to live on main level with bedroom/bathroom     Bathroom Shower/Tub: Walk-in shower         Home Equipment: None;Other (comment)   Additional Comments: "walking stick"      Prior Functioning/Environment Level of Independence: Independent        Comments: Pt reports living at home with several children and husband. She is I without use of AD for functional mobility, ADLs, and IADLs at baseline.        OT Problem List: Decreased strength;Impaired balance (sitting and/or standing);Decreased cognition;Decreased safety awareness;Decreased activity tolerance;Decreased coordination;Decreased knowledge of use of DME or AE;Impaired UE functional use      OT Treatment/Interventions: Self-care/ADL training;Manual therapy;Therapeutic exercise;Patient/family education;Neuromuscular education;Balance training;Energy conservation;Therapeutic activities;DME and/or AE instruction;Cognitive remediation/compensation    OT Goals(Current goals can be found in the care plan section) Acute Rehab OT Goals Patient Stated Goal: to go home OT Goal Formulation: With patient Time For Goal Achievement: 06/30/20 Potential to Achieve Goals: Good ADL Goals Pt  Will Perform Grooming: with supervision Pt Will Transfer to Toilet: with supervision Pt Will Perform Toileting - Clothing Manipulation and hygiene: with supervision Pt Will Perform Tub/Shower Transfer: with supervision Pt/caregiver will Perform Home Exercise Program: Left upper extremity;With theraband;With Supervision;With written HEP provided  OT Frequency: Min 2X/week   Barriers to D/C:    none known at this time          AM-PAC OT "6 Clicks" Daily Activity     Outcome Measure Help from another person eating meals?: A Little Help from another person taking care of personal grooming?: A Little Help from another person toileting, which includes using toliet, bedpan, or urinal?: A Little Help from another person bathing (including washing, rinsing, drying)?: A Little Help from another person to put on and taking off regular upper body clothing?: A Little Help from another person to put on and taking off regular lower body clothing?: A Little 6 Click Score: 18   End of Session Nurse Communication: Mobility status  Activity Tolerance: Patient tolerated treatment well Patient left: in bed;with call bell/phone within reach;with bed alarm set  OT Visit Diagnosis: Unsteadiness on feet (R26.81);Muscle weakness (generalized) (M62.81);Hemiplegia and hemiparesis Hemiplegia - Right/Left: Left Hemiplegia - dominant/non-dominant: Non-Dominant Hemiplegia - caused by: Cerebral infarction                Time: 0950-1011 OT Time Calculation (min): 21 min Charges:  OT General Charges $  OT Visit: 1 Visit OT Evaluation $OT Eval Moderate Complexity: 1 Mod OT Treatments $Self Care/Home Management : 8-22 mins   Jackquline Denmark, MS, OTR/L , CBIS ascom [phone]  06/16/20, 11:15 AM   06/16/2020, 11:14 AM

## 2020-06-17 DIAGNOSIS — I10 Essential (primary) hypertension: Secondary | ICD-10-CM | POA: Diagnosis not present

## 2020-06-17 LAB — GLUCOSE, CAPILLARY: Glucose-Capillary: 182 mg/dL — ABNORMAL HIGH (ref 70–99)

## 2020-06-17 MED ORDER — CLOPIDOGREL BISULFATE 75 MG PO TABS: 75.0000 mg | ORAL_TABLET | Freq: Every day | ORAL | 0 refills | Status: DC

## 2020-06-17 MED ORDER — ASPIRIN 81 MG PO CHEW: 81.0000 mg | CHEWABLE_TABLET | Freq: Every day | ORAL | 0 refills | Status: AC

## 2020-06-17 NOTE — Discharge Summary (Signed)
Aldona Baricole Hundertmark ZOX:[id number] DOB: 07/30/1965 DOA: 06/15/2020  PCP: Pcp, No  Admit date: 06/15/2020 Discharge date: 06/17/2020  Admitted From: Home Disposition: Home  Recommendations for Outpatient Follow-up:  1. Follow up with PCP in 1 week 2. Please obtain BMP/CBC in one week 3. Please follow up with neurology Dr. Sherryll BurgerShah in one week 4. F/u with Dr. Gilda CreaseSchnier vascular surgery in 2 weeks     Discharge Condition:Stable CODE STATUS:full  Diet recommendation: Heart Healthy  Brief/Interim Summary: Per WJX:BJYNWGHPI:Destini Felipa Furnacelli is a 55 y.o. female with medical history significant for type 2 diabetes and hypertension who presented to the ED for evaluation of left-sided numbness.Patient states she was in her usual state of health until around 5 PM on 5/24 when she developed new onset of left-sided weakness with heaviness in her left leg.  She says she nearly collapsed to the ground however her husband was by her side and able to gently guide her to the floor.  She did not sustain any injury or lose consciousness.  She said she was having difficulty walking due to the heaviness in her left leg and feeling uncoordinated.She stated the only medication she is currently taking is glipizide for diabetes.  She stated she was previously on antihypertensives however her blood pressure was on the lower side therefore it was discontinued.  She reported a family history of stroke in her father. SARS-CoV-2 PCR is positive.  Influenza A/B PCR's are negative.  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  MRI brain without contrast showed patchy small acute cortical/subcortical infarct within the right MCA territory, as well as right MCA/ACA and MCA/PCA watershed territories. Infarcts are present within the right frontal and parietal lobes.  Incidentally noted left retrocerebellar arachnoid cyst again noted.   Incidentally noted 7 mm hyperintense focus within the right parotid gland also noted.   MRA HEAD IMPRESSION:  1. Motion degraded exam. 2. Atheromatous irregularity throughout the carotid siphons with associated moderate to severe stenoses, left worse than right. 3. Short-segment moderate stenosis at the left P1/P2 junction. 4. Atheromatous irregularity versus artifact and possible short-segment moderate stenosis at the distal basilar artery as above.  MRA NECK IMPRESSION:  1. 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 Finding is likely the source of the acute watershed infarcts seen on prior brain MRI. Both carotid artery systems otherwise widely patent within the neck. 2. Short-segment moderate approximate 50% stenosis at the origin of the right vertebral artery. Vertebral arteries otherwise patent within the neck    Acute CVA: Neurology was consulted.  Also vascular surgery was consulted for severe right ICA stenosis.  Plan to perform stenting in 2 to 3 weeks after hospital discharge Echo normal EF, no clot, negative bubble study Strict avoidance of hypotension given right ICA stenosis Counseled patient regarding glycemic control Patient will be discharged on aspirin 81 mg plus Plavix 75 mg daily which will both be continued through stenting. LDL 41, at goal A1c 8.4    Incidental COVID-19 positive test: + screening. Currently asx Not vaccinated. Encouraged to get vaccinated.  Also told patient to keep isolation for total of 10 days .   Type 2 diabetes: A1c 8.4 Continue home meds Carb control-heart healthy diet F/u with pcp for further mx   Hypertension: Avoid hypotension due to RICA stenosis   Right parotid gland focus: Incidentally noted 7 mm round hyperintense focus  within the right parotid gland noted on MRI brain. Nonemergent ultrasound recommended for further characterization. F/u with pcp for  further evaluation   Called and Updated husband on discharge and the f/u's and the [place] for parotid.   Discharge Diagnoses:  Principal Problem:   Acute CVA (cerebrovascular accident) [facility]) Active Problems:   Type 2 diabetes mellitus (HCC)   Hypertension associated with diabetes [facility])    Discharge Instructions  Discharge Instructions    Ambulatory referral to Neurology   Complete by: As directed    Stroke f/u in 6 wks   Call MD for:  persistant dizziness or light-headedness   Complete by: As directed    Diet - low sodium heart healthy   Complete by: As directed    Discharge instructions   Complete by: As directed    Follow up with pcp in one week   Increase activity slowly   Complete by: As directed      Allergies as of 06/17/2020   No Known Allergies     Medication List    TAKE these medications   aspirin 81 MG chewable tablet Chew 1 tablet (81 mg total) by mouth daily. Start taking on: Jun 18, 2020   clopidogrel 75 MG tablet Commonly known as: PLAVIX Take 1 tablet (75 mg total) by mouth daily.   glimepiride 4 MG tablet Commonly known as: AMARYL Take 4 mg by mouth 2 (two) times daily.   rosuvastatin 40 MG tablet Commonly known as: CRESTOR Take 40 mg by mouth daily.   Vitamin D (Ergocalciferol) 1.25 MG (50000 UNIT) Caps capsule Commonly known as: DRISDOL Take 1 capsule by mouth once a week.       Follow-up Information    Schnier, Latina Craver, MD In 7 days.   Specialties: Vascular Surgery, Cardiology, Radiology, Vascular Surgery Why: with me double book if needed no studies on 06/23/2020 Contact information: [address] [place] [address] [phone]        Lonell Face, MD In 1 week.   Specialty: Neurology Contact information: [address] [facility] [place] [address] [phone]              No Known Allergies  Consultations:  Neurology and vascular surgery   Procedures/Studies: CT HEAD  WO CONTRAST  Result Date: 06/15/2020 CLINICAL DATA:  Provided history: Neuro deficit, acute, stroke suspected. Additional history provided: Left-sided numbness which began last night. EXAM: CT HEAD WITHOUT CONTRAST TECHNIQUE: Contiguous axial images were obtained from the base of the skull through the vertex without intravenous contrast. COMPARISON:  No pertinent prior exams available for comparison. FINDINGS: Brain: Cerebral volume is normal. 6 mm focal hypodensity within the right frontal lobe centrum semiovale (series 3, image 19) (series 5, image 22). Retrocerebellar CSF density prominence just to the left of midline measuring 2.1 x 2.4 cm in transaxial dimensions (series 3, image 13) (series 5, image 28). This likely reflects an incidental arachnoid cyst. There is no acute intracranial hemorrhage. No demarcated cortical infarct. No extra-axial fluid collection. No evidence of intracranial mass. No midline shift. Partially empty sella turcica. Vascular: No hyperdense vessel.  Atherosclerotic calcifications. Skull: Normal. Negative for fracture or focal lesion. Sinuses/Orbits: Visualized orbits show no acute finding. Moderate sized fluid level within the left frontal sinus. Scattered mild mucosal thickening and possible fluid within the bilateral ethmoid air cells. IMPRESSION: 6 mm hypodensity within the right frontal lobe white matter, which may reflect an age-indeterminate lacunar infarct or prominent perivascular space. Given  the provided history of left-sided weakness, consider a noncontrast brain MRI for further evaluation. 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 This finding is very commonly incidental, but can be associated with idiopathic intracranial hypertension. Otherwise unremarkable non-contrast CT appearance of the brain. Paranasal sinus disease, as described. Electronically Signed   By: Jackey Loge DO   On: 06/15/2020 15:12   MR ANGIO HEAD WO  CONTRAST  Result Date: 06/15/2020 CLINICAL DATA:  Initial evaluation for acute stroke. EXAM: MRA NECK WITHOUT AND WITH CONTRAST MRA HEAD WITHOUT CONTRAST TECHNIQUE: Multiplanar and multiecho pulse sequences of the neck were obtained without and with intravenous contrast. Angiographic images of the neck were obtained using MRA technique without and with intravenous contrast; Angiographic images of the Circle of Willis were obtained using MRA technique without intravenous contrast. CONTRAST:  81mL GADAVIST GADOBUTROL 1 MMOL/ML IV SOLN COMPARISON:  Prior brain MRI from earlier the same day. FINDINGS: MRA NECK FINDINGS AORTIC ARCH: Visualized aortic arch normal caliber with normal 3 vessel morphology. No hemodynamically significant stenosis seen about the origin of the great vessels. RIGHT CAROTID SYSTEM: Right CCA patent from its origin to the bifurcation without stenosis. There is a severe 70-80% stenosis involving the proximal right ICA, just distal to the bifurcation (series 1059, image 7). Area of involvement essentially begins at the bifurcation, and measures approximately 1 cm in length. Distally, the right ICA is patent without stenosis, evidence for dissection, or occlusion. LEFT CAROTID SYSTEM: Left CCA patent from its origin to the bifurcation without stenosis. Mild atheromatous irregularity about the left carotid bulb/proximal left ICA without hemodynamically significant stenosis. Left ICA patent distally without stenosis, evidence for dissection or occlusion. VERTEBRAL ARTERIES: Both vertebral arteries arise from the subclavian arteries. Short-segment moderate approximate 50% stenosis at the origin of the right vertebral artery (series 1071, image 9). Vertebral arteries otherwise patent within the neck without stenosis, evidence for dissection, or occlusion. MRA HEAD FINDINGS ANTERIOR CIRCULATION: Examination degraded by motion artifact. Petrous segments patent bilaterally. Diffuse atheromatous  irregularity with narrowing throughout the carotid siphons. Associated moderate to severe stenosis at the anterior genu of the cavernous right ICA (series 1042, image 3). Additional multifocal severe stenoses noted about the cavernous and para clinoid left ICA (series 1042, image 15). ICA termini well perfused. A1 segments patent. Normal anterior communicating artery complex. Anterior cerebral arteries patent to their distal aspects without appreciable stenosis. No M1 stenosis or occlusion. Normal MCA bifurcations. Distal MCA branches perfused and fairly symmetric. POSTERIOR CIRCULATION: Both V4 segments patent to the vertebrobasilar junction without stenosis. Left vertebral artery dominant. Left PICA origin patent and normal. Right PICA not seen. Irregularity within the proximal and mid basilar artery could be related to atheromatous disease versus artifact. Similarly, an apparent short-segment moderate stenosis at the distal basilar artery could be related to artifact or atherosclerotic disease (sees series 1052, image 10). Superior cerebellar arteries patent proximally. Both PCAs primarily supplied via the basilar. Short-segment moderate stenosis at the left P1/P2 junction (series 1052, image 10). PCAs otherwise well perfused to their distal aspects without stenosis. No intracranial aneurysm. IMPRESSION: MRA HEAD IMPRESSION: 1. Motion degraded exam. 2. Atheromatous irregularity throughout the carotid siphons with associated moderate to severe stenoses, left worse than right. 3. Short-segment moderate stenosis at the left P1/P2 junction. 4. Atheromatous irregularity versus artifact and possible short-segment moderate stenosis at the distal basilar artery as above. MRA NECK IMPRESSION: 1. 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 Finding  is likely the source of the acute watershed infarcts seen on prior brain MRI. Both carotid artery systems otherwise widely patent  within the neck. 2. Short-segment moderate approximate 50% stenosis at the origin of the right vertebral artery. Vertebral arteries otherwise patent within the neck Electronically Signed   By: Rise Mu M.D.   On: 06/15/2020 23:23   MR ANGIO NECK W WO CONTRAST  Result Date: 06/15/2020 CLINICAL DATA:  Initial evaluation for acute stroke. EXAM: MRA NECK WITHOUT AND WITH CONTRAST MRA HEAD WITHOUT CONTRAST TECHNIQUE: Multiplanar and multiecho pulse sequences of the neck were obtained without and with intravenous contrast. Angiographic images of the neck were obtained using MRA technique without and with intravenous contrast; Angiographic images of the Circle of Willis were obtained using MRA technique without intravenous contrast. CONTRAST:  7mL GADAVIST GADOBUTROL 1 MMOL/ML IV SOLN COMPARISON:  Prior brain MRI from earlier the same day. FINDINGS: MRA NECK FINDINGS AORTIC ARCH: Visualized aortic arch normal caliber with normal 3 vessel morphology. No hemodynamically significant stenosis seen about the origin of the great vessels. RIGHT CAROTID SYSTEM: Right CCA patent from its origin to the bifurcation without stenosis. There is a severe 70-80% stenosis involving the proximal right ICA, just distal to the bifurcation (series 1059, image 7). Area of involvement essentially begins at the bifurcation, and measures approximately 1 cm in length. Distally, the right ICA is patent without stenosis, evidence for dissection, or occlusion. LEFT CAROTID SYSTEM: Left CCA patent from its origin to the bifurcation without stenosis. Mild atheromatous irregularity about the left carotid bulb/proximal left ICA without hemodynamically significant stenosis. Left ICA patent distally without stenosis, evidence for dissection or occlusion. VERTEBRAL ARTERIES: Both vertebral arteries arise from the subclavian arteries. Short-segment moderate approximate 50% stenosis at the origin of the right vertebral artery (series 1071,  image 9). Vertebral arteries otherwise patent within the neck without stenosis, evidence for dissection, or occlusion. MRA HEAD FINDINGS ANTERIOR CIRCULATION: Examination degraded by motion artifact. Petrous segments patent bilaterally. Diffuse atheromatous irregularity with narrowing throughout the carotid siphons. Associated moderate to severe stenosis at the anterior genu of the cavernous right ICA (series 1042, image 3). Additional multifocal severe stenoses noted about the cavernous and para clinoid left ICA (series 1042, image 15). ICA termini well perfused. A1 segments patent. Normal anterior communicating artery complex. Anterior cerebral arteries patent to their distal aspects without appreciable stenosis. No M1 stenosis or occlusion. Normal MCA bifurcations. Distal MCA branches perfused and fairly symmetric. POSTERIOR CIRCULATION: Both V4 segments patent to the vertebrobasilar junction without stenosis. Left vertebral artery dominant. Left PICA origin patent and normal. Right PICA not seen. Irregularity within the proximal and mid basilar artery could be related to atheromatous disease versus artifact. Similarly, an apparent short-segment moderate stenosis at the distal basilar artery could be related to artifact or atherosclerotic disease (sees series 1052, image 10). Superior cerebellar arteries patent proximally. Both PCAs primarily supplied via the basilar. Short-segment moderate stenosis at the left P1/P2 junction (series 1052, image 10). PCAs otherwise well perfused to their distal aspects without stenosis. No intracranial aneurysm. IMPRESSION: MRA HEAD IMPRESSION: 1. Motion degraded exam. 2. Atheromatous irregularity throughout the carotid siphons with associated moderate to severe stenoses, left worse than right. 3. Short-segment moderate stenosis at the left P1/P2 junction. 4. Atheromatous irregularity versus artifact and possible short-segment moderate stenosis at the distal basilar artery as  above. MRA NECK IMPRESSION: 1. 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 Finding is likely the source  of the acute watershed infarcts seen on prior brain MRI. Both carotid artery systems otherwise widely patent within the neck. 2. Short-segment moderate approximate 50% stenosis at the origin of the right vertebral artery. Vertebral arteries otherwise patent within the neck Electronically Signed   By: Rise Mu M.D.   On: 06/15/2020 23:23   MR BRAIN WO CONTRAST  Result Date: 06/15/2020 CLINICAL DATA:  Brain mass or lesion. EXAM: MRI HEAD WITHOUT CONTRAST TECHNIQUE: Multiplanar, multiecho pulse sequences of the brain and surrounding structures were obtained without intravenous contrast. COMPARISON:  No pertinent prior exams available for comparison. FINDINGS: Brain: Cerebral volume is normal. There are patchy small acute cortical/subcortical infarcts within the right MCA territory, as well as right MCA/ACA and MCA/PCA watershed territories, within the right frontal and parietal lobes. No evidence of intracranial mass. No chronic intracranial blood products. No extra-axial fluid collection. No midline shift. CSF intensity prominence posterior to the cerebellum just to the left of midline measuring 2.1 x 2.5 cm in transaxial dimensions, likely reflecting an incidental arachnoid cyst. Vascular: Expected proximal arterial flow voids. Skull and upper cervical spine: No focal marrow lesion. Sinuses/Orbits: Visualized orbits show no acute finding. Mild left greater than right frontal and bilateral ethmoid sinus mucosal thickening. Trace mucosal thickening is per also present within the bilateral sphenoid sinuses. Other: 7 mm round T2/FLAIR hyperintense focus within the right parotid gland (series 15, image 3). IMPRESSION: Patchy small acute cortical/subcortical infarcts within the right MCA territory, as well as right MCA/ACA and MCA/PCA watershed territories.  These infarcts are present within the right frontal and parietal lobes. Consider MR or CT angiography of the head/neck for further evaluation. Incidentally noted left retrocerebellar arachnoid cyst. Incidentally noted 7 mm round T2/FLAIR hyperintense focus within the right parotid gland. This could reflect a prominent intraparotid lymph node. However, a primary parotid neoplasm cannot be excluded. Nonemergent targeted ultrasound recommended for further characterization. Paranasal sinus disease, as described. Electronically Signed   By: Jackey Loge DO   On: 06/15/2020 18:47   ECHOCARDIOGRAM COMPLETE BUBBLE STUDY  Result Date: 06/16/2020    ECHOCARDIOGRAM REPORT   Patient Name:   Tylia Kotula Date of Exam: 06/16/2020 Medical Rec #:  [id number]   Height:       65.0 in Accession #:    [id number]  Weight:       177.7 lb Date of Birth:  1965/03/04    BSA:          1.881 m Patient Age:    55 years    BP:           111/57 mmHg Patient Gender: F           HR:           86 bpm. Exam Location:  [facility] Procedure: 2D Echo, Cardiac Doppler, Color Doppler and Saline Contrast Bubble            Study Indications:     Stroke 434.91 /I63.9  History:         Patient has no prior history of Echocardiogram examinations.                  Risk Factors:Hypertension and Diabetes.  Sonographer:     Cristela Blue RDCS (AE) Referring Phys:  [id number] Malachi Carl STACK Diagnosing Phys: Julien Nordmann MD IMPRESSIONS  1. Left ventricular ejection fraction, by estimation, is 60 to 65%. The left ventricle has normal function. The left ventricle has no regional wall motion abnormalities. Left ventricular  diastolic parameters were normal.  2. Right ventricular systolic function is normal. The right ventricular size is normal. There is normal pulmonary artery systolic pressure. The estimated right ventricular systolic pressure is 22.3 mmHg. FINDINGS  Left Ventricle: Left ventricular ejection fraction, by estimation, is 60 to 65%. The left ventricle has normal  function. The left ventricle has no regional wall motion abnormalities. The left ventricular internal cavity size was normal in size. There is  no left ventricular hypertrophy. Left ventricular diastolic parameters were normal. Right Ventricle: The right ventricular size is normal. No increase in right ventricular wall thickness. Right ventricular systolic function is normal. There is normal pulmonary artery systolic pressure. The tricuspid regurgitant velocity is 2.08 m/s, and  with an assumed right atrial pressure of 5 mmHg, the estimated right ventricular systolic pressure is 22.3 mmHg. Left Atrium: Left atrial size was normal in size. Right Atrium: Right atrial size was normal in size. Pericardium: There is no evidence of pericardial effusion. Mitral Valve: The mitral valve is normal in structure. No evidence of mitral valve regurgitation. No evidence of mitral valve stenosis. Tricuspid Valve: The tricuspid valve is normal in structure. Tricuspid valve regurgitation is mild . No evidence of tricuspid stenosis. Aortic Valve: The aortic valve is normal in structure. Aortic valve regurgitation is not visualized. No aortic stenosis is present. Aortic valve mean gradient measures 6.0 mmHg. Aortic valve peak gradient measures 10.4 mmHg. Aortic valve area, by VTI measures 2.59 cm. Pulmonic Valve: The pulmonic valve was normal in structure. Pulmonic valve regurgitation is not visualized. No evidence of pulmonic stenosis. Aorta: The aortic root is normal in size and structure. Venous: The inferior vena cava is normal in size with greater than 50% respiratory variability, suggesting right atrial pressure of 3 mmHg. IAS/Shunts: No atrial level shunt detected by color flow Doppler. Agitated saline contrast was given intravenously to evaluate for intracardiac shunting. Agitated saline contrast bubble study was negative, with no evidence of any interatrial shunt.  LEFT VENTRICLE PLAX 2D LVIDd:         4.16 cm  Diastology  LVIDs:         2.30 cm  LV e' medial:    11.00 cm/s LV PW:         1.09 cm  LV E/e' medial:  9.0 LV IVS:        0.78 cm  LV e' lateral:   15.90 cm/s LVOT diam:     2.00 cm  LV E/e' lateral: 6.2 LV SV:         79 LV SV Index:   42 LVOT Area:     3.14 cm  RIGHT VENTRICLE RV Basal diam:  1.89 cm RV S prime:     12.00 cm/s TAPSE (M-mode): 4.0 cm LEFT ATRIUM             Index       RIGHT ATRIUM           Index LA diam:        3.30 cm 1.75 cm/m  RA Area:     11.70 cm LA Vol (A2C):   53.4 ml 28.39 ml/m RA Volume:   23.70 ml  12.60 ml/m LA Vol (A4C):   43.1 ml 22.91 ml/m LA Biplane Vol: 48.2 ml 25.62 ml/m  AORTIC VALVE                    PULMONIC VALVE AV Area (Vmax):    2.07 cm  PV Vmax:        0.77 m/s AV Area (Vmean):   2.35 cm     PV Peak grad:   2.4 mmHg AV Area (VTI):     2.59 cm     RVOT Peak grad: 5 mmHg AV Vmax:           161.00 cm/s AV Vmean:          112.000 cm/s AV VTI:            0.304 m AV Peak Grad:      10.4 mmHg AV Mean Grad:      6.0 mmHg LVOT Vmax:         106.00 cm/s LVOT Vmean:        83.800 cm/s LVOT VTI:          0.251 m LVOT/AV VTI ratio: 0.83  AORTA Ao Root diam: 3.10 cm MITRAL VALVE                TRICUSPID VALVE MV Area (PHT): 5.62 cm     TR Peak grad:   17.3 mmHg MV Decel Time: 135 msec     TR Vmax:        208.00 cm/s MV E velocity: 98.50 cm/s MV A velocity: 104.00 cm/s  SHUNTS MV E/A ratio:  0.95         Systemic VTI:  0.25 m                             Systemic Diam: 2.00 cm Julien Nordmann MD Electronically signed by Julien Nordmann MD Signature Date/Time: 06/16/2020/6:04:35 PM    Final        Subjective: Feels better . No sob or cp  Discharge Exam: Vitals:   06/17/20 0400 06/17/20 0800  BP: 134/73 104/63  Pulse: 87 83  Resp: 16 18  Temp: 98.4 F (36.9 C) 98 F (36.7 C)  SpO2: 94% 100%   Vitals:   06/16/20 2034 06/16/20 2355 06/17/20 0400 06/17/20 0800  BP: 130/72 (!) 115/50 134/73 104/63  Pulse: 91 (!) 102 87 83  Resp: 16 18 16 18   Temp: 99.9 F (37.7 C)  99.5 F (37.5 C) 98.4 F (36.9 C) 98 F (36.7 C)  TempSrc: Oral Oral Oral   SpO2: 98% 99% 94% 100%  Weight:      Height:        General: Pt is alert, awake, not in acute distress Cardiovascular: RRR, S1/S2 +, no rubs, no gallops Respiratory: CTA bilaterally, no wheezing, no rhonchi Abdominal: Soft, NT, ND, bowel sounds + Extremities: no edema    The results of significant diagnostics from this hospitalization (including imaging, microbiology, ancillary and laboratory) are listed below for reference.     Microbiology: Recent Results (from the past 240 hour(s))  Resp Panel by RT-PCR (Flu A&B, Covid) Nasopharyngeal Swab     Status: Abnormal   Collection Time: 06/15/20  5:04 PM   Specimen: Nasopharyngeal Swab; Nasopharyngeal(NP) swabs in vial transport medium  Result Value Ref Range Status   SARS Coronavirus 2 by RT PCR POSITIVE (A) NEGATIVE Final    Comment: RESULT CALLED TO, READ BACK BY AND VERIFIED WITH: AMY COYNE @1833  ON 06/15/20 SKL (NOTE) SARS-CoV-2 target nucleic acids are DETECTED.  The SARS-CoV-2 RNA is generally detectable in upper respiratory specimens during the acute phase of infection. Positive results are indicative of the presence of the identified virus, but do not rule out  bacterial infection or co-infection with other pathogens not detected by the test. Clinical correlation with patient history and other diagnostic information is necessary to determine patient infection status. The expected result is Negative.  Fact Sheet for Patients: BloggerCourse.com  Fact Sheet for Healthcare Providers: SeriousBroker.it  This test is not yet approved or cleared by the [place] FDA and  has been authorized for detection and/or diagnosis of SARS-CoV-2 by FDA under an Emergency Use Authorization (EUA).  This EUA will remain in effect (meaning this test can be [place] ed) for the duration of  the COVID-19 declaration  under Section 564(b)(1) of the Act, 21 U.S.C. section 360bbb-3(b)(1), unless the authorization is terminated or revoked sooner.     Influenza A by PCR NEGATIVE NEGATIVE Final   Influenza B by PCR NEGATIVE NEGATIVE Final    Comment: (NOTE) The Xpert Xpress SARS-CoV-2/FLU/RSV plus assay is intended as an aid in the diagnosis of influenza from Nasopharyngeal swab specimens and should not be used as a sole basis for treatment. Nasal washings and aspirates are unacceptable for Xpert Xpress SARS-CoV-2/FLU/RSV testing.  Fact Sheet for Patients: BloggerCourse.com  Fact Sheet for Healthcare Providers: SeriousBroker.it  This test is not yet approved or cleared by the [place] FDA and has been authorized for detection and/or diagnosis of SARS-CoV-2 by FDA under an Emergency Use Authorization (EUA). This EUA will remain in effect (meaning this test can be used) for the duration of the COVID-19 declaration under Section 564(b)(1) of the Act, 21 U.S.C. section 360bbb-3(b)(1), unless the authorization is terminated or revoked.  Performed at [facility], [address] [place], [place] [address]      Labs: BNP (last 3 results) No results for input(s): BNP in the last 8760 hours. Basic Metabolic Panel: Recent Labs  Lab 06/15/20 1448  NA 138  K 4.2  CL 104  CO2 26  GLUCOSE 193*  BUN 28*  CREATININE 0.68  CALCIUM 9.1   Liver Function Tests: Recent Labs  Lab 06/15/20 1448  AST 23  ALT 31  ALKPHOS 119  BILITOT 0.4  PROT 8.4*  ALBUMIN 3.7   No results for input(s): LIPASE, AMYLASE in the last 168 hours. No results for input(s): AMMONIA in the last 168 hours. CBC: Recent Labs  Lab 06/15/20 1448  WBC 9.8  NEUTROABS 5.4  HGB 11.4*  HCT 34.5*  MCV 87.1  PLT 296   Cardiac Enzymes: No results for input(s): CKTOTAL, CKMB, CKMBINDEX, TROPONINI in the last 168 hours. BNP: Invalid input(s):  POCBNP CBG: Recent Labs  Lab 06/16/20 0732 06/16/20 1232 06/16/20 1615 06/16/20 2035 06/17/20 0758  GLUCAP 182* 230* 191* 179* 182*   D-Dimer No results for input(s): DDIMER in the last 72 hours. Hgb A1c Recent Labs    06/15/20 1448  HGBA1C 8.4*   Lipid Profile Recent Labs    06/15/20 1448 06/16/20 0505  CHOL  --  91  HDL  --  29*  LDLCALC  --  41  TRIG  --  105  CHOLHDL  --  3.1  LDLDIRECT 50.0  --    Thyroid function studies No results for input(s): TSH, T4TOTAL, T3FREE, THYROIDAB in the last 72 hours.  Invalid input(s): FREET3 Anemia work up No results for input(s): VITAMINB12, FOLATE, FERRITIN, TIBC, IRON, RETICCTPCT in the last 72 hours. Urinalysis    Component Value Date/Time   COLORURINE YELLOW (A) 06/15/2020 1901   APPEARANCEUR CLOUDY (A) 06/15/2020 1901   APPEARANCEUR Cloudy 01/27/2014 0144   LABSPEC 1.008  06/15/2020 1901   LABSPEC 1.033 01/27/2014 0144   PHURINE 6.0 06/15/2020 1901   GLUCOSEU NEGATIVE 06/15/2020 1901   GLUCOSEU >=500 01/27/2014 0144   HGBUR MODERATE (A) 06/15/2020 1901   BILIRUBINUR NEGATIVE 06/15/2020 1901   BILIRUBINUR Negative 01/27/2014 0144   KETONESUR NEGATIVE 06/15/2020 1901   PROTEINUR 30 (A) 06/15/2020 1901   NITRITE POSITIVE (A) 06/15/2020 1901   LEUKOCYTESUR LARGE (A) 06/15/2020 1901   LEUKOCYTESUR 3+ 01/27/2014 0144   Sepsis Labs Invalid input(s): PROCALCITONIN,  WBC,  LACTICIDVEN Microbiology Recent Results (from the past 240 hour(s))  Resp Panel by RT-PCR (Flu A&B, Covid) Nasopharyngeal Swab     Status: Abnormal   Collection Time: 06/15/20  5:04 PM   Specimen: Nasopharyngeal Swab; Nasopharyngeal(NP) swabs in vial transport medium  Result Value Ref Range Status   SARS Coronavirus 2 by RT PCR POSITIVE (A) NEGATIVE Final    Comment: RESULT CALLED TO, READ BACK BY AND VERIFIED WITH: AMY COYNE  ON 06/15/20 SKL (NOTE) SARS-CoV-2 target nucleic acids are DETECTED.  The SARS-CoV-2 RNA is generally detectable  in upper respiratory specimens during the acute phase of infection. Positive results are indicative of the presence of the identified virus, but do not rule out bacterial infection or co-infection with other pathogens not detected by the test. Clinical correlation with patient history and other diagnostic information is necessary to determine patient infection status. The expected result is Negative.  Fact Sheet for Patients: BloggerCourse.com  Fact Sheet for Healthcare Providers: SeriousBroker.it  This test is not yet approved or cleared by the [place] FDA and  has been authorized for detection and/or diagnosis of SARS-CoV-2 by FDA under an Emergency Use Authorization (EUA).  This EUA will remain in effect (meaning this test can be [place] ed) for the duration of  the COVID-19 declaration under Section 564(b)(1) of the Act, 21 U.S.C. section 360bbb-3(b)(1), unless the authorization is terminated or revoked sooner.     Influenza A by PCR NEGATIVE NEGATIVE Final   Influenza B by PCR NEGATIVE NEGATIVE Final    Comment: (NOTE) The Xpert Xpress SARS-CoV-2/FLU/RSV plus assay is intended as an aid in the diagnosis of influenza from Nasopharyngeal swab specimens and should not be used as a sole basis for treatment. Nasal washings and aspirates are unacceptable for Xpert Xpress SARS-CoV-2/FLU/RSV testing.  Fact Sheet for Patients: BloggerCourse.com  Fact Sheet for Healthcare Providers: SeriousBroker.it  This test is not yet approved or cleared by the [place] FDA and has been authorized for detection and/or diagnosis of SARS-CoV-2 by FDA under an Emergency Use Authorization (EUA). This EUA will remain in effect (meaning this test can be used) for the duration of the COVID-19 declaration under Section 564(b)(1) of the Act, 21 U.S.C. section 360bbb-3(b)(1), unless the authorization  is terminated or revoked.  Performed at [facility], [address], [place], [place] [address]      Time coordinating discharge: Over 30 minutes  SIGNED:   Lynn Ito, MD  [facility] 06/17/2020, 10:04 AM Pager   If 7PM-7AM, please contact night-coverage [email or web address] Password [email or web address]

## 2020-06-17 NOTE — Plan of Care (Signed)
Problem: Education: Goal: Knowledge of General Education information will improve Description: Including pain rating scale, medication(s)/side effects and non-pharmacologic comfort measures 06/17/2020 0544 by Maximino Sarin, RN Outcome: Progressing 06/17/2020 0543 by Maximino Sarin, RN Outcome: Progressing   Problem: Health Behavior/Discharge Planning: Goal: Ability to manage health-related needs will improve 06/17/2020 0544 by Maximino Sarin, RN Outcome: Progressing 06/17/2020 0543 by Maximino Sarin, RN Outcome: Progressing   Problem: Clinical Measurements: Goal: Ability to maintain clinical measurements within normal limits will improve 06/17/2020 0544 by Maximino Sarin, RN Outcome: Progressing 06/17/2020 0543 by Maximino Sarin, RN Outcome: Progressing Goal: Will remain free from infection 06/17/2020 0544 by Maximino Sarin, RN Outcome: Progressing 06/17/2020 0543 by Maximino Sarin, RN Outcome: Progressing Goal: Diagnostic test results will improve 06/17/2020 0544 by Maximino Sarin, RN Outcome: Progressing 06/17/2020 0543 by Maximino Sarin, RN Outcome: Progressing Goal: Respiratory complications will improve 06/17/2020 0544 by Maximino Sarin, RN Outcome: Progressing 06/17/2020 0543 by Maximino Sarin, RN Outcome: Progressing Goal: Cardiovascular complication will be avoided 06/17/2020 0544 by Maximino Sarin, RN Outcome: Progressing 06/17/2020 0543 by Maximino Sarin, RN Outcome: Progressing   Problem: Activity: Goal: Risk for activity intolerance will decrease 06/17/2020 0544 by Maximino Sarin, RN Outcome: Progressing 06/17/2020 0543 by Maximino Sarin, RN Outcome: Progressing   Problem: Nutrition: Goal: Adequate nutrition will be maintained 06/17/2020 0544 by Maximino Sarin, RN Outcome: Progressing 06/17/2020 0543 by Maximino Sarin, RN Outcome: Progressing   Problem: Coping: Goal: Level of anxiety will decrease 06/17/2020 0544 by Maximino Sarin, RN Outcome: Progressing 06/17/2020 0543 by Maximino Sarin, RN Outcome: Progressing   Problem: Elimination: Goal: Will not experience complications related to bowel motility 06/17/2020 0544 by Maximino Sarin, RN Outcome: Progressing 06/17/2020 0543 by Maximino Sarin, RN Outcome: Progressing Goal: Will not experience complications related to urinary retention 06/17/2020 0544 by Maximino Sarin, RN Outcome: Progressing 06/17/2020 0543 by Maximino Sarin, RN Outcome: Progressing   Problem: Pain Managment: Goal: General experience of comfort will improve 06/17/2020 0544 by Maximino Sarin, RN Outcome: Progressing 06/17/2020 0543 by Maximino Sarin, RN Outcome: Progressing   Problem: Safety: Goal: Ability to remain free from injury will improve 06/17/2020 0544 by Maximino Sarin, RN Outcome: Progressing 06/17/2020 0543 by Maximino Sarin, RN Outcome: Progressing   Problem: Skin Integrity: Goal: Risk for impaired skin integrity will decrease 06/17/2020 0544 by Maximino Sarin, RN Outcome: Progressing 06/17/2020 0543 by Maximino Sarin, RN Outcome: Progressing   Problem: Education: Goal: Knowledge of secondary prevention will improve 06/17/2020 0544 by Maximino Sarin, RN Outcome: Progressing 06/17/2020 0543 by Maximino Sarin, RN Outcome: Progressing Goal: Knowledge of patient specific risk factors addressed and post discharge goals established will improve 06/17/2020 0544 by Maximino Sarin, RN Outcome: Progressing 06/17/2020 0543 by Maximino Sarin, RN Outcome: Progressing Goal: Individualized Educational Video(s) 06/17/2020 0544 by Maximino Sarin, RN Outcome: Progressing 06/17/2020 0543 by Maximino Sarin, RN Outcome: Progressing   Problem: Coping: Goal: Will identify appropriate support needs 06/17/2020 0544 by Maximino Sarin, RN Outcome: Progressing 06/17/2020 0543 by Maximino Sarin, RN Outcome: Progressing   Problem: Health  Behavior/Discharge Planning: Goal: Ability to manage health-related needs will improve 06/17/2020 0544 by Maximino Sarin, RN Outcome: Progressing 06/17/2020 0543 by Maximino Sarin, RN Outcome: Progressing   Problem: Self-Care: Goal: Verbalization of feelings and concerns over difficulty with self-care will improve 06/17/2020 0544 by  Maximino Sarin, RN Outcome: Progressing 06/17/2020 0543 by Maximino Sarin, RN Outcome: Progressing Goal: Ability to communicate needs accurately will improve 06/17/2020 0544 by Maximino Sarin, RN Outcome: Progressing 06/17/2020 0543 by Maximino Sarin, RN Outcome: Progressing   Problem: Nutrition: Goal: Risk of aspiration will decrease 06/17/2020 0544 by Maximino Sarin, RN Outcome: Progressing 06/17/2020 0543 by Maximino Sarin, RN Outcome: Progressing Goal: Dietary intake will improve 06/17/2020 0544 by Maximino Sarin, RN Outcome: Progressing 06/17/2020 0543 by Maximino Sarin, RN Outcome: Progressing   Problem: Intracerebral Hemorrhage Tissue Perfusion: Goal: Complications of Intracerebral Hemorrhage will be minimized 06/17/2020 0544 by Maximino Sarin, RN Outcome: Progressing 06/17/2020 0543 by Maximino Sarin, RN Outcome: Progressing   Problem: Ischemic Stroke/TIA Tissue Perfusion: Goal: Complications of ischemic stroke/TIA will be minimized 06/17/2020 0544 by Maximino Sarin, RN Outcome: Progressing 06/17/2020 0543 by Maximino Sarin, RN Outcome: Progressing   Problem: Spontaneous Subarachnoid Hemorrhage Tissue Perfusion: Goal: Complications of Spontaneous Subarachnoid Hemorrhage will be minimized 06/17/2020 0544 by Maximino Sarin, RN Outcome: Progressing 06/17/2020 0543 by Maximino Sarin, RN Outcome: Progressing

## 2020-06-17 NOTE — Plan of Care (Signed)
  Problem: Education: Goal: Knowledge of General Education information will improve Description: Including pain rating scale, medication(s)/side effects and non-pharmacologic comfort measures Outcome: Progressing   Problem: Health Behavior/Discharge Planning: Goal: Ability to manage health-related needs will improve Outcome: Progressing   Problem: Clinical Measurements: Goal: Ability to maintain clinical measurements within normal limits will improve Outcome: Progressing Goal: Will remain free from infection Outcome: Progressing Goal: Diagnostic test results will improve Outcome: Progressing Goal: Respiratory complications will improve Outcome: Progressing Goal: Cardiovascular complication will be avoided Outcome: Progressing   Problem: Activity: Goal: Risk for activity intolerance will decrease Outcome: Progressing   Problem: Nutrition: Goal: Adequate nutrition will be maintained Outcome: Progressing   Problem: Coping: Goal: Level of anxiety will decrease Outcome: Progressing   Problem: Elimination: Goal: Will not experience complications related to bowel motility Outcome: Progressing Goal: Will not experience complications related to urinary retention Outcome: Progressing   Problem: Pain Managment: Goal: General experience of comfort will improve Outcome: Progressing   Problem: Safety: Goal: Ability to remain free from injury will improve Outcome: Progressing   Problem: Skin Integrity: Goal: Risk for impaired skin integrity will decrease Outcome: Progressing   Problem: Education: Goal: Knowledge of secondary prevention will improve Outcome: Progressing Goal: Knowledge of patient specific risk factors addressed and post discharge goals established will improve Outcome: Progressing Goal: Individualized Educational Video(s) Outcome: Progressing   Problem: Coping: Goal: Will identify appropriate support needs Outcome: Progressing   Problem: Health  Behavior/Discharge Planning: Goal: Ability to manage health-related needs will improve Outcome: Progressing   Problem: Self-Care: Goal: Verbalization of feelings and concerns over difficulty with self-care will improve Outcome: Progressing Goal: Ability to communicate needs accurately will improve Outcome: Progressing   Problem: Nutrition: Goal: Risk of aspiration will decrease Outcome: Progressing Goal: Dietary intake will improve Outcome: Progressing   Problem: Intracerebral Hemorrhage Tissue Perfusion: Goal: Complications of Intracerebral Hemorrhage will be minimized Outcome: Progressing   Problem: Ischemic Stroke/TIA Tissue Perfusion: Goal: Complications of ischemic stroke/TIA will be minimized Outcome: Progressing   Problem: Spontaneous Subarachnoid Hemorrhage Tissue Perfusion: Goal: Complications of Spontaneous Subarachnoid Hemorrhage will be minimized Outcome: Progressing

## 2020-06-17 NOTE — Consult Note (Signed)
NEUROLOGY CONSULTATION NOTE   Date of service: Jun 17, 2020 Patient Name: Sharon Gray MRN:  [id number] DOB:  04-Dec-1965 Reason for consult: "stroke _ _ _   _ __   _ __ _ _  __ __   _ __   __ _  History of Present Illness   Sharon Gray is a 55 y.o. female with medical history significant for type 2 diabetes and hypertension who presented to the ED for evaluation of left-sided numbness.  Patient states she was in her usual state of health until around 5 PM on 5/24 when she developed new onset of left-sided weakness with heaviness in her left leg.  She says she nearly collapsed to the ground however her husband was by her side and able to gently guide her to the floor.  She did not sustain any injury or lose consciousness.  She said she was having difficulty walking due to the heaviness in her left leg and feeling uncoordinated. She denies other focal neuro deficits including facial droop, dysarthria, aphasia, or vertigo.  She otherwise denies any lightheadedness, dizziness, headache, subjective fevers, chills, diaphoresis, chest pain, palpitations, dyspnea, abdominal pain, nausea, vomiting, or diarrhea.  She is not on an antiplatelet as an outpatient and takes oly glipizide for DM. She was formerly on antihypertensives but these were discontinued after her PCP said she no longer needed them.   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 These infarcts are present within the right frontal and parietal lobes. Consider MR or CT angiography of the head/neck for further evaluation.  Incidentally noted left retrocerebellar arachnoid cyst.  Incidentally noted 7 mm round T2/FLAIR hyperintense focus within the right parotid gland. This could reflect a prominent intraparotid lymph node. However, a primary parotid neoplasm cannot be excluded. Nonemergent  targeted ultrasound recommended for further Characterization.  MRA HEAD IMPRESSION:  1. Motion degraded exam. 2. Atheromatous irregularity throughout the carotid siphons with associated moderate to severe stenoses, left worse than right. 3. Short-segment moderate stenosis at the left P1/P2 junction. 4. Atheromatous irregularity versus artifact and possible short-segment moderate stenosis at the distal basilar artery as above.  MRA NECK IMPRESSION:  1. 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 Finding is likely the source of the acute watershed infarcts seen on prior brain MRI. Both carotid artery systems otherwise widely patent within the neck. 2. Short-segment moderate approximate 50% stenosis at the origin of the right vertebral artery. Vertebral arteries otherwise patent within the neck  TTE no significant abnl, no intracardiac clot  LDL 41  A1c 8.4   ROS   10 point review of systems was performed and was negative except as described in HPI.  Past History   Past Medical History:  Diagnosis Date  . Diabetes mellitus without complication (HCC)   . Hypertension    History reviewed. No pertinent surgical history. Family History  Problem Relation Age of Onset  . Stroke Father    Social History   Socioeconomic History  . Marital status: Unknown    Spouse name: Not on file  . Number of children: Not on file  . Years of education: Not on file  . Highest education level: Not on file  Occupational History  . Not on file  Tobacco Use  . Smoking status: Never Smoker  . Smokeless tobacco: Never Used  Substance and  Sexual Activity  . Alcohol use: Not Currently  . Drug use: Not Currently  . Sexual activity: Not on file  Other Topics Concern  . Not on file  Social History Narrative  . Not on file   Social Determinants of Health   Financial Resource Strain: Not on file  Food Insecurity: Not on file   Transportation Needs: Not on file  Physical Activity: Not on file  Stress: Not on file  Social Connections: Not on file   No Known Allergies  Medications   Medications Prior to Admission  Medication Sig Dispense Refill Last Dose  . glimepiride (AMARYL) 4 MG tablet Take 4 mg by mouth 2 (two) times daily.   06/14/2020 at 2000  . rosuvastatin (CRESTOR) 40 MG tablet Take 40 mg by mouth daily.   06/14/2020 at 0900  . Vitamin D, Ergocalciferol, (DRISDOL) 1.25 MG (50000 UNIT) CAPS capsule Take 1 capsule by mouth once a week.   06/10/2020 at Unknown time     Vitals   Vitals:   06/16/20 1555 06/16/20 2034 06/16/20 2355 06/17/20 0400  BP: 123/72 130/72 (!) 115/50 134/73  Pulse: 90 91 (!) 102 87  Resp: 16 16 18 16   Temp: 98.9 F (37.2 C) 99.9 F (37.7 C) 99.5 F (37.5 C) 98.4 F (36.9 C)  TempSrc:  Oral Oral Oral  SpO2: 100% 98% 99% 94%  Weight:      Height:         Body mass index is 29.57 kg/m.  Physical Exam   Physical Exam Gen: A&O x4, NAD HEENT: Atraumatic, normocephalic;mucous membranes moist; oropharynx clear, tongue without atrophy or fasciculations. Neck: Supple, trachea midline. Resp: CTAB, no w/r/r CV: RRR, no m/g/r; nml S1 and S2. 2+ symmetric peripheral pulses. Abd: soft/NT/ND; nabs x 4 quad Extrem: Nml bulk; no cyanosis, clubbing, or edema.  Neuro: *MS: A&O x4. Follows multi-step commands.  *Speech: fluid, nondysarthric, able to name and repeat *CN:    I: Deferred   II,III: PERRLA, VFF by confrontation, optic discs sharp   III,IV,VI: EOMI w/o nystagmus, no ptosis   V: Sensation intact from V1 to V3 to LT   VII: Eyelid closure was full.  L NLF flattening   VIII: Hearing intact to voice   IX,X: Voice normal, palate elevates symmetrically    XI: SCM/trap 5/5 bilat   XII: Tongue protrudes midline, no atrophy or fasciculations   *Motor:   Normal bulk.  No tremor, rigidity or bradykinesia. No pronator drift. RUE and RLE full strength. LUE 4/5 with drift, LLE  no drift but 4/5 DF/PF. *Sensory: Intact to light touch, pinprick, temperature vibration throughout. Symmetric. Propioception intact bilat.  No double-simultaneous extinction.  *Coordination:  Finger-to-nose, heel-to-shin, rapid alternating motions were intact. *Reflexes:  2+ and symmetric throughout without clonus; toes down-going bilat *Gait: normal base, normal stride, normal turn. Negative Romberg.  NIHSS = 1 for facial palsy, 1 for LLE drift   Premorbid mRS = 1  Labs   CBC:  Recent Labs  Lab 06/15/20 1448  WBC 9.8  NEUTROABS 5.4  HGB 11.4*  HCT 34.5*  MCV 87.1  PLT 296    Basic Metabolic Panel:  Lab Results  Component Value Date   NA 138 06/15/2020   K 4.2 06/15/2020   CO2 26 06/15/2020   GLUCOSE 193 (H) 06/15/2020   BUN 28 (H) 06/15/2020   CREATININE 0.68 06/15/2020   CALCIUM 9.1 06/15/2020   GFRNONAA >60 06/15/2020   GFRAA >60 01/27/2014   Lipid Panel:  Lab Results  Component Value Date   LDLCALC 41 06/16/2020   HgbA1c:  Lab Results  Component Value Date   HGBA1C 8.4 (H) 06/15/2020   Urine Drug Screen: No results found for: LABOPIA, COCAINSCRNUR, LABBENZ, AMPHETMU, THCU, LABBARB  Alcohol Level No results found for: ETH   Impression   55 yo woman with hx DM p/w L sided weakness and found to have small acute cortical/subcortical infarcts within the right MCA territory, as well as right MCA/ACA and MCA/PCA watershed territories.  Recommendations   - Stroke w/u complete - Patient evaluated by vascular surgery for severe R ICA stenosis and plans to perform stenting 2-3 wks after hospital discharge - Discharge patient on ASA 81mg  + plavix 75mg  daily which will both be continued through stenting - Counsel patient regarding glycemic control - Strict avoidance of hypotension given R ICA stenosis - No indication for statin given LDL at goal <70 - Outpatient f/u with neurology ( I will arrange)  Plan discussed with patient and husband. Neurology will not  continue to actively follow, but please re-engage if additional questions arise. ______________________________________________________________________   Thank you for the opportunity to take part in the care of this patient. If you have any further questions, please contact the neurology consultation attending.  Signed,  , MD [facility] [phone]  If 7pm- 7am, please page neurology on call as listed in AMION.

## 2020-06-27 ENCOUNTER — Other Ambulatory Visit: Payer: Self-pay

## 2020-06-27 ENCOUNTER — Ambulatory Visit (INDEPENDENT_AMBULATORY_CARE_PROVIDER_SITE_OTHER): Payer: Medicaid Other | Admitting: Vascular Surgery

## 2020-06-27 ENCOUNTER — Encounter (INDEPENDENT_AMBULATORY_CARE_PROVIDER_SITE_OTHER): Payer: Self-pay | Admitting: Vascular Surgery

## 2020-06-27 VITALS — BP 138/79 | HR 83 | Resp 16 | Ht 63.0 in | Wt 175.2 lb

## 2020-06-27 DIAGNOSIS — E1159 Type 2 diabetes mellitus with other circulatory complications: Secondary | ICD-10-CM

## 2020-06-27 DIAGNOSIS — I6521 Occlusion and stenosis of right carotid artery: Secondary | ICD-10-CM | POA: Diagnosis not present

## 2020-06-27 DIAGNOSIS — I639 Cerebral infarction, unspecified: Secondary | ICD-10-CM | POA: Diagnosis not present

## 2020-06-27 DIAGNOSIS — I152 Hypertension secondary to endocrine disorders: Secondary | ICD-10-CM | POA: Diagnosis not present

## 2020-06-27 NOTE — H&P (View-Only) (Signed)
  MRN : [id number]  Sharon Gray is a 55 y.o. (06/29/1965) female who presents with chief complaint of No chief complaint on file. .  History of Present Illness:   The patient is seen for follow up evaluation of her symptomatic carotid stenosis.    MRA which demonstrated 90% stenosis at the origin of the right internal carotid artery.  This evening on talking with the patient she notes that her left arm is already gotten quite a bit stronger and her left  leg is much improved.  She is having only minimal difficulty walking.  Past MRI of the brain demonstrated multiple infarcts right hemispheric  The patient denies amaurosis fugax or visual. There is no interval history of TIA symptoms or focal motor deficits. There is a recent documented CVA.  The patient is taking enteric-coated aspirin 81 mg daily.  There is no history of migraine headaches. There is no history of seizures.  The patient has a history of coronary artery disease, no recent episodes of angina or shortness of breath. The patient denies PAD or claudication symptoms. There is a history of hyperlipidemia which is being treated with a statin.      No outpatient medications have been marked as taking for the 06/27/20 encounter (Appointment) with Tharon Kitch G, MD.    Past Medical History:  Diagnosis Date  . Diabetes mellitus without complication (HCC)   . Hypertension     No past surgical history on file.  Social History Social History   Tobacco Use  . Smoking status: Never Smoker  . Smokeless tobacco: Never Used  Substance Use Topics  . Alcohol use: Not Currently  . Drug use: Not Currently    Family History Family History  Problem Relation Age of Onset  . Stroke Father   No family history of bleeding/clotting disorders, porphyria or autoimmune disease   No Known Allergies   REVIEW OF SYSTEMS (Negative unless checked)  Constitutional: []Weight loss  []Fever  []Chills Cardiac: []Chest pain    []Chest pressure   []Palpitations   []Shortness of breath when laying flat   []Shortness of breath with exertion. Vascular:  []Pain in legs with walking   []Pain in legs at rest  []History of DVT   []Phlebitis   []Swelling in legs   []Varicose veins   []Non-healing ulcers Pulmonary:   []Uses home oxygen   []Productive cough   []Hemoptysis   []Wheeze  []COPD   []Asthma Neurologic:  []Dizziness   []Seizures   [x]History of stroke   []History of TIA  []Aphasia   []Vissual changes   [x]Weakness or numbness in arm   [x]Weakness or numbness in leg Musculoskeletal:   []Joint swelling   []Joint pain   []Low back pain Hematologic:  []Easy bruising  []Easy bleeding   []Hypercoagulable state   []Anemic Gastrointestinal:  []Diarrhea   []Vomiting  []Gastroesophageal reflux/heartburn   []Difficulty swallowing. Genitourinary:  []Chronic kidney disease   []Difficult urination  []Frequent urination   []Blood in urine Skin:  []Rashes   []Ulcers  Psychological:  []History of anxiety   [] History of major depression.  Physical Examination  There were no vitals filed for this visit. There is no height or weight on file to calculate BMI. Gen: WD/WN, NAD Head: [place]/AT, No temporalis wasting.  Ear/Nose/Throat: Hearing grossly intact, nares w/o erythema or drainage, poor dentition Eyes: PER, EOMI, sclera nonicteric.  Neck: Supple, no masses.  No bruit or JVD.  Pulmonary:  Good air movement, clear to   auscultation bilaterally, no use of accessory muscles.  Cardiac: RRR, normal S1, S2, no Murmurs. Vascular: carotid bruits noted Vessel Right Left  Radial Palpable Palpable  Brachial Palpable Palpable  Carotid Palpable Palpable  Gastrointestinal: soft, non-distended. No guarding/no peritoneal signs.  Musculoskeletal: M/S 5/5 throughout.  No deformity or atrophy.  Neurologic: CN 2-12 intact. Pain and light touch intact in extremities.  Symmetrical.  Speech is fluent. Motor exam as listed above. Psychiatric: Judgment  intact, Mood & affect appropriate for pt's clinical situation. Dermatologic: No rashes or ulcers noted.  No changes consistent with cellulitis.   CBC Lab Results  Component Value Date   WBC 9.8 06/15/2020   HGB 11.4 (L) 06/15/2020   HCT 34.5 (L) 06/15/2020   MCV 87.1 06/15/2020   PLT 296 06/15/2020    BMET    Component Value Date/Time   NA 138 06/15/2020 1448   NA 137 01/27/2014 0144   K 4.2 06/15/2020 1448   K 4.1 01/27/2014 0144   CL 104 06/15/2020 1448   CL 102 01/27/2014 0144   CO2 26 06/15/2020 1448   CO2 27 01/27/2014 0144   GLUCOSE 193 (H) 06/15/2020 1448   GLUCOSE 401 (H) 01/27/2014 0144   BUN 28 (H) 06/15/2020 1448   BUN 18 01/27/2014 0144   CREATININE 0.68 06/15/2020 1448   CREATININE 0.93 01/27/2014 0144   CALCIUM 9.1 06/15/2020 1448   CALCIUM 8.4 (L) 01/27/2014 0144   GFRNONAA >60 06/15/2020 1448   GFRNONAA >60 01/27/2014 0144   GFRAA >60 01/27/2014 0144   Estimated Creatinine Clearance: 85.2 mL/min (by C-G formula based on SCr of 0.68 mg/dL).  COAG Lab Results  Component Value Date   INR 1.0 06/15/2020    Radiology CT HEAD WO CONTRAST  Result Date: 06/15/2020 CLINICAL DATA:  Provided history: Neuro deficit, acute, stroke suspected. Additional history provided: Left-sided numbness which began last night. EXAM: CT HEAD WITHOUT CONTRAST TECHNIQUE: Contiguous axial images were obtained from the base of the skull through the vertex without intravenous contrast. COMPARISON:  No pertinent prior exams available for comparison. FINDINGS: Brain: Cerebral volume is normal. 6 mm focal hypodensity within the right frontal lobe centrum semiovale (series 3, image 19) (series 5, image 22). Retrocerebellar CSF density prominence just to the left of midline measuring 2.1 x 2.4 cm in transaxial dimensions (series 3, image 13) (series 5, image 28). This likely reflects an incidental arachnoid cyst. There is no acute intracranial hemorrhage. No demarcated cortical infarct. No  extra-axial fluid collection. No evidence of intracranial mass. No midline shift. Partially empty sella turcica. Vascular: No hyperdense vessel.  Atherosclerotic calcifications. Skull: Normal. Negative for fracture or focal lesion. Sinuses/Orbits: Visualized orbits show no acute finding. Moderate sized fluid level within the left frontal sinus. Scattered mild mucosal thickening and possible fluid within the bilateral ethmoid air cells. IMPRESSION: 6 mm hypodensity within the right frontal lobe white matter, which may reflect an age-indeterminate lacunar infarct or prominent perivascular space. Given the provided history of left-sided weakness, consider a noncontrast brain MRI for further evaluation. 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 This finding is very commonly incidental, but can be associated with idiopathic intracranial hypertension. Otherwise unremarkable non-contrast CT appearance of the brain. Paranasal sinus disease, as described. Electronically Signed   By: Kyle  Golden DO   On: 06/15/2020 15:12   MR ANGIO HEAD WO CONTRAST  Result Date: 06/15/2020 CLINICAL DATA:  Initial evaluation for acute stroke. EXAM: MRA NECK WITHOUT AND WITH CONTRAST MRA HEAD   WITHOUT CONTRAST TECHNIQUE: Multiplanar and multiecho pulse sequences of the neck were obtained without and with intravenous contrast. Angiographic images of the neck were obtained using MRA technique without and with intravenous contrast; Angiographic images of the Circle of Willis were obtained using MRA technique without intravenous contrast. CONTRAST:  7mL GADAVIST GADOBUTROL 1 MMOL/ML IV SOLN COMPARISON:  Prior brain MRI from earlier the same day. FINDINGS: MRA NECK FINDINGS AORTIC ARCH: Visualized aortic arch normal caliber with normal 3 vessel morphology. No hemodynamically significant stenosis seen about the origin of the great vessels. RIGHT CAROTID SYSTEM: Right CCA patent from its origin to the  bifurcation without stenosis. There is a severe 70-80% stenosis involving the proximal right ICA, just distal to the bifurcation (series 1059, image 7). Area of involvement essentially begins at the bifurcation, and measures approximately 1 cm in length. Distally, the right ICA is patent without stenosis, evidence for dissection, or occlusion. LEFT CAROTID SYSTEM: Left CCA patent from its origin to the bifurcation without stenosis. Mild atheromatous irregularity about the left carotid bulb/proximal left ICA without hemodynamically significant stenosis. Left ICA patent distally without stenosis, evidence for dissection or occlusion. VERTEBRAL ARTERIES: Both vertebral arteries arise from the subclavian arteries. Short-segment moderate approximate 50% stenosis at the origin of the right vertebral artery (series 1071, image 9). Vertebral arteries otherwise patent within the neck without stenosis, evidence for dissection, or occlusion. MRA HEAD FINDINGS ANTERIOR CIRCULATION: Examination degraded by motion artifact. Petrous segments patent bilaterally. Diffuse atheromatous irregularity with narrowing throughout the carotid siphons. Associated moderate to severe stenosis at the anterior genu of the cavernous right ICA (series 1042, image 3). Additional multifocal severe stenoses noted about the cavernous and para clinoid left ICA (series 1042, image 15). ICA termini well perfused. A1 segments patent. Normal anterior communicating artery complex. Anterior cerebral arteries patent to their distal aspects without appreciable stenosis. No M1 stenosis or occlusion. Normal MCA bifurcations. Distal MCA branches perfused and fairly symmetric. POSTERIOR CIRCULATION: Both V4 segments patent to the vertebrobasilar junction without stenosis. Left vertebral artery dominant. Left PICA origin patent and normal. Right PICA not seen. Irregularity within the proximal and mid basilar artery could be related to atheromatous disease versus  artifact. Similarly, an apparent short-segment moderate stenosis at the distal basilar artery could be related to artifact or atherosclerotic disease (sees series 1052, image 10). Superior cerebellar arteries patent proximally. Both PCAs primarily supplied via the basilar. Short-segment moderate stenosis at the left P1/P2 junction (series 1052, image 10). PCAs otherwise well perfused to their distal aspects without stenosis. No intracranial aneurysm. IMPRESSION: MRA HEAD IMPRESSION: 1. Motion degraded exam. 2. Atheromatous irregularity throughout the carotid siphons with associated moderate to severe stenoses, left worse than right. 3. Short-segment moderate stenosis at the left P1/P2 junction. 4. Atheromatous irregularity versus artifact and possible short-segment moderate stenosis at the distal basilar artery as above. MRA NECK IMPRESSION: 1. 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 Finding is likely the source of the acute watershed infarcts seen on prior brain MRI. Both carotid artery systems otherwise widely patent within the neck. 2. Short-segment moderate approximate 50% stenosis at the origin of the right vertebral artery. Vertebral arteries otherwise patent within the neck Electronically Signed   By: Benjamin  McClintock M.D.   On: 06/15/2020 23:23   MR ANGIO NECK W WO CONTRAST  Result Date: 06/15/2020 CLINICAL DATA:  Initial evaluation for acute stroke. EXAM: MRA NECK WITHOUT AND WITH CONTRAST MRA HEAD WITHOUT CONTRAST TECHNIQUE: Multiplanar and   multiecho pulse sequences of the neck were obtained without and with intravenous contrast. Angiographic images of the neck were obtained using MRA technique without and with intravenous contrast; Angiographic images of the Circle of Willis were obtained using MRA technique without intravenous contrast. CONTRAST:  7mL GADAVIST GADOBUTROL 1 MMOL/ML IV SOLN COMPARISON:  Prior brain MRI from earlier the same day.  FINDINGS: MRA NECK FINDINGS AORTIC ARCH: Visualized aortic arch normal caliber with normal 3 vessel morphology. No hemodynamically significant stenosis seen about the origin of the great vessels. RIGHT CAROTID SYSTEM: Right CCA patent from its origin to the bifurcation without stenosis. There is a severe 70-80% stenosis involving the proximal right ICA, just distal to the bifurcation (series 1059, image 7). Area of involvement essentially begins at the bifurcation, and measures approximately 1 cm in length. Distally, the right ICA is patent without stenosis, evidence for dissection, or occlusion. LEFT CAROTID SYSTEM: Left CCA patent from its origin to the bifurcation without stenosis. Mild atheromatous irregularity about the left carotid bulb/proximal left ICA without hemodynamically significant stenosis. Left ICA patent distally without stenosis, evidence for dissection or occlusion. VERTEBRAL ARTERIES: Both vertebral arteries arise from the subclavian arteries. Short-segment moderate approximate 50% stenosis at the origin of the right vertebral artery (series 1071, image 9). Vertebral arteries otherwise patent within the neck without stenosis, evidence for dissection, or occlusion. MRA HEAD FINDINGS ANTERIOR CIRCULATION: Examination degraded by motion artifact. Petrous segments patent bilaterally. Diffuse atheromatous irregularity with narrowing throughout the carotid siphons. Associated moderate to severe stenosis at the anterior genu of the cavernous right ICA (series 1042, image 3). Additional multifocal severe stenoses noted about the cavernous and para clinoid left ICA (series 1042, image 15). ICA termini well perfused. A1 segments patent. Normal anterior communicating artery complex. Anterior cerebral arteries patent to their distal aspects without appreciable stenosis. No M1 stenosis or occlusion. Normal MCA bifurcations. Distal MCA branches perfused and fairly symmetric. POSTERIOR CIRCULATION: Both V4  segments patent to the vertebrobasilar junction without stenosis. Left vertebral artery dominant. Left PICA origin patent and normal. Right PICA not seen. Irregularity within the proximal and mid basilar artery could be related to atheromatous disease versus artifact. Similarly, an apparent short-segment moderate stenosis at the distal basilar artery could be related to artifact or atherosclerotic disease (sees series 1052, image 10). Superior cerebellar arteries patent proximally. Both PCAs primarily supplied via the basilar. Short-segment moderate stenosis at the left P1/P2 junction (series 1052, image 10). PCAs otherwise well perfused to their distal aspects without stenosis. No intracranial aneurysm. IMPRESSION: MRA HEAD IMPRESSION: 1. Motion degraded exam. 2. Atheromatous irregularity throughout the carotid siphons with associated moderate to severe stenoses, left worse than right. 3. Short-segment moderate stenosis at the left P1/P2 junction. 4. Atheromatous irregularity versus artifact and possible short-segment moderate stenosis at the distal basilar artery as above. MRA NECK IMPRESSION: 1. 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 Finding is likely the source of the acute watershed infarcts seen on prior brain MRI. Both carotid artery systems otherwise widely patent within the neck. 2. Short-segment moderate approximate 50% stenosis at the origin of the right vertebral artery. Vertebral arteries otherwise patent within the neck Electronically Signed   By: Benjamin  McClintock M.D.   On: 06/15/2020 23:23   MR BRAIN WO CONTRAST  Result Date: 06/15/2020 CLINICAL DATA:  Brain mass or lesion. EXAM: MRI HEAD WITHOUT CONTRAST TECHNIQUE: Multiplanar, multiecho pulse sequences of the brain and surrounding structures were obtained without intravenous contrast. COMPARISON:    No pertinent prior exams available for comparison. FINDINGS: Brain: Cerebral volume is normal.  There are patchy small acute cortical/subcortical infarcts within the right MCA territory, as well as right MCA/ACA and MCA/PCA watershed territories, within the right frontal and parietal lobes. No evidence of intracranial mass. No chronic intracranial blood products. No extra-axial fluid collection. No midline shift. CSF intensity prominence posterior to the cerebellum just to the left of midline measuring 2.1 x 2.5 cm in transaxial dimensions, likely reflecting an incidental arachnoid cyst. Vascular: Expected proximal arterial flow voids. Skull and upper cervical spine: No focal marrow lesion. Sinuses/Orbits: Visualized orbits show no acute finding. Mild left greater than right frontal and bilateral ethmoid sinus mucosal thickening. Trace mucosal thickening is per also present within the bilateral sphenoid sinuses. Other: 7 mm round T2/FLAIR hyperintense focus within the right parotid gland (series 15, image 3). IMPRESSION: Patchy small acute cortical/subcortical infarcts within the right MCA territory, as well as right MCA/ACA and MCA/PCA watershed territories. These infarcts are present within the right frontal and parietal lobes. Consider MR or CT angiography of the head/neck for further evaluation. Incidentally noted left retrocerebellar arachnoid cyst. Incidentally noted 7 mm round T2/FLAIR hyperintense focus within the right parotid gland. This could reflect a prominent intraparotid lymph node. However, a primary parotid neoplasm cannot be excluded. Nonemergent targeted ultrasound recommended for further characterization. Paranasal sinus disease, as described. Electronically Signed   By: Kyle  Golden DO   On: 06/15/2020 18:47   ECHOCARDIOGRAM COMPLETE BUBBLE STUDY  Result Date: 06/16/2020    ECHOCARDIOGRAM REPORT   Patient Name:   Sheccid Strong Date of Exam: 06/16/2020 Medical Rec #:  [id number]   Height:       65.0 in Accession #:    [id number]  Weight:       177.7 lb Date of Birth:  07/29/1965    BSA:           1.881 m Patient Age:    55 years    BP:           111/57 mmHg Patient Gender: F           HR:           86 bpm. Exam Location:  [facility] Procedure: 2D Echo, Cardiac Doppler, Color Doppler and Saline Contrast Bubble            Study Indications:     Stroke 434.91 /I63.9  History:         Patient has no prior history of Echocardiogram examinations.                  Risk Factors:Hypertension and Diabetes.  Sonographer:     Jerry Hege RDCS (AE) Referring Phys:  [id number] COLLEEN M STACK Diagnosing Phys: Timothy Gollan MD IMPRESSIONS  1. Left ventricular ejection fraction, by estimation, is 60 to 65%. The left ventricle has normal function. The left ventricle has no regional wall motion abnormalities. Left ventricular diastolic parameters were normal.  2. Right ventricular systolic function is normal. The right ventricular size is normal. There is normal pulmonary artery systolic pressure. The estimated right ventricular systolic pressure is 22.3 mmHg. FINDINGS  Left Ventricle: Left ventricular ejection fraction, by estimation, is 60 to 65%. The left ventricle has normal function. The left ventricle has no regional wall motion abnormalities. The left ventricular internal cavity size was normal in size. There is  no left ventricular hypertrophy. Left ventricular diastolic parameters were normal. Right Ventricle: The right ventricular size is normal.   No increase in right ventricular wall thickness. Right ventricular systolic function is normal. There is normal pulmonary artery systolic pressure. The tricuspid regurgitant velocity is 2.08 m/s, and  with an assumed right atrial pressure of 5 mmHg, the estimated right ventricular systolic pressure is 22.3 mmHg. Left Atrium: Left atrial size was normal in size. Right Atrium: Right atrial size was normal in size. Pericardium: There is no evidence of pericardial effusion. Mitral Valve: The mitral valve is normal in structure. No evidence of mitral valve regurgitation. No evidence  of mitral valve stenosis. Tricuspid Valve: The tricuspid valve is normal in structure. Tricuspid valve regurgitation is mild . No evidence of tricuspid stenosis. Aortic Valve: The aortic valve is normal in structure. Aortic valve regurgitation is not visualized. No aortic stenosis is present. Aortic valve mean gradient measures 6.0 mmHg. Aortic valve peak gradient measures 10.4 mmHg. Aortic valve area, by VTI measures 2.59 cm. Pulmonic Valve: The pulmonic valve was normal in structure. Pulmonic valve regurgitation is not visualized. No evidence of pulmonic stenosis. Aorta: The aortic root is normal in size and structure. Venous: The inferior vena cava is normal in size with greater than 50% respiratory variability, suggesting right atrial pressure of 3 mmHg. IAS/Shunts: No atrial level shunt detected by color flow Doppler. Agitated saline contrast was given intravenously to evaluate for intracardiac shunting. Agitated saline contrast bubble study was negative, with no evidence of any interatrial shunt.  LEFT VENTRICLE PLAX 2D LVIDd:         4.16 cm  Diastology LVIDs:         2.30 cm  LV e' medial:    11.00 cm/s LV PW:         1.09 cm  LV E/e' medial:  9.0 LV IVS:        0.78 cm  LV e' lateral:   15.90 cm/s LVOT diam:     2.00 cm  LV E/e' lateral: 6.2 LV SV:         79 LV SV Index:   42 LVOT Area:     3.14 cm  RIGHT VENTRICLE RV Basal diam:  1.89 cm RV S prime:     12.00 cm/s TAPSE (M-mode): 4.0 cm LEFT ATRIUM             Index       RIGHT ATRIUM           Index LA diam:        3.30 cm 1.75 cm/m  RA Area:     11.70 cm LA Vol (A2C):   53.4 ml 28.39 ml/m RA Volume:   23.70 ml  12.60 ml/m LA Vol (A4C):   43.1 ml 22.91 ml/m LA Biplane Vol: 48.2 ml 25.62 ml/m  AORTIC VALVE                    PULMONIC VALVE AV Area (Vmax):    2.07 cm     PV Vmax:        0.77 m/s AV Area (Vmean):   2.35 cm     PV Peak grad:   2.4 mmHg AV Area (VTI):     2.59 cm     RVOT Peak grad: 5 mmHg AV Vmax:           161.00 cm/s AV Vmean:           112.000 cm/s AV VTI:            0.304 m AV Peak Grad:        10.4 mmHg AV Mean Grad:      6.0 mmHg LVOT Vmax:         106.00 cm/s LVOT Vmean:        83.800 cm/s LVOT VTI:          0.251 m LVOT/AV VTI ratio: 0.83  AORTA Ao Root diam: 3.10 cm MITRAL VALVE                TRICUSPID VALVE MV Area (PHT): 5.62 cm     TR Peak grad:   17.3 mmHg MV Decel Time: 135 msec     TR Vmax:        208.00 cm/s MV E velocity: 98.50 cm/s MV A velocity: 104.00 cm/s  SHUNTS MV E/A ratio:  0.95         Systemic VTI:  0.25 m                             Systemic Diam: 2.00 cm Timothy Gollan MD Electronically signed by Timothy Gollan MD Signature Date/Time: 06/16/2020/6:04:35 PM    Final      Assessment/Plan 1. Carotid artery stenosis, symptomatic, right Recommend:  The patient is symptomatic with respect to the carotid stenosis.  The patient now has progressed and has a lesion the is >90%.  Patient's CT angiography of the carotid arteries confirms >90% right ICA stenosis.  The anatomical considerations support stenting over surgery.  This was discussed in detail with the patient.  The risks, benefits and alternative therapies were reviewed in detail with the patient.  All questions were answered.  The patient agrees to proceed with stenting of the right carotid artery.  Continue antiplatelet therapy as prescribed. Continue management of CAD, HTN and Hyperlipidemia. Healthy heart diet, encouraged exercise at least 4 times per week.   2. Hypertension associated with diabetes (HCC) Continue antihypertensive medications as already ordered, these medications have been reviewed and there are no changes at this time.   3. Type 2 diabetes mellitus with other circulatory complication, unspecified whether long term insulin use (HCC) Continue hypoglycemic medications as already ordered, these medications have been reviewed and there are no changes at this time.  Hgb A1C to be monitored as already arranged by primary  service   4. Acute CVA (cerebrovascular accident) (HCC) Continue antiplatelet therapy as ordered do Not stop the Plavix before stenting    Eliab Closson, MD  06/27/2020 12:30 PM   

## 2020-06-27 NOTE — Progress Notes (Signed)
MRN : [id number]  Sharon Gray is a 55 y.o. (04/11/1965) female who presents with chief complaint of No chief complaint on file. Marland Kitchen.  History of Present Illness:   The patient is seen for follow up evaluation of her symptomatic carotid stenosis.    MRA which demonstrated 90% stenosis at the origin of the right internal carotid artery.  This evening on talking with the patient she notes that her left arm is already gotten quite a bit stronger and her left  leg is much improved.  She is having only minimal difficulty walking.  Past MRI of the brain demonstrated multiple infarcts right hemispheric  The patient denies amaurosis fugax or visual. There is no interval history of TIA symptoms or focal motor deficits. There is a recent documented CVA.  The patient is taking enteric-coated aspirin 81 mg daily.  There is no history of migraine headaches. There is no history of seizures.  The patient has a history of coronary artery disease, no recent episodes of angina or shortness of breath. The patient denies PAD or claudication symptoms. There is a history of hyperlipidemia which is being treated with a statin.      No outpatient medications have been marked as taking for the 06/27/20 encounter (Appointment) with Gilda CreaseSchnier, Latina CraverGregory G, MD.    Past Medical History:  Diagnosis Date  . Diabetes mellitus without complication (HCC)   . Hypertension     No past surgical history on file.  Social History Social History   Tobacco Use  . Smoking status: Never Smoker  . Smokeless tobacco: Never Used  Substance Use Topics  . Alcohol use: Not Currently  . Drug use: Not Currently    Family History Family History  Problem Relation Age of Onset  . Stroke Father   No family history of bleeding/clotting disorders, porphyria or autoimmune disease   No Known Allergies   REVIEW OF SYSTEMS (Negative unless checked)  Constitutional: [] Weight loss  [] Fever  [] Chills Cardiac: [] Chest pain    [] Chest pressure   [] Palpitations   [] Shortness of breath when laying flat   [] Shortness of breath with exertion. Vascular:  [] Pain in legs with walking   [] Pain in legs at rest  [] History of DVT   [] Phlebitis   [] Swelling in legs   [] Varicose veins   [] Non-healing ulcers Pulmonary:   [] Uses home oxygen   [] Productive cough   [] Hemoptysis   [] Wheeze  [] COPD   [] Asthma Neurologic:  [] Dizziness   [] Seizures   [x] History of stroke   [] History of TIA  [] Aphasia   [] Vissual changes   [x] Weakness or numbness in arm   [x] Weakness or numbness in leg Musculoskeletal:   [] Joint swelling   [] Joint pain   [] Low back pain Hematologic:  [] Easy bruising  [] Easy bleeding   [] Hypercoagulable state   [] Anemic Gastrointestinal:  [] Diarrhea   [] Vomiting  [] Gastroesophageal reflux/heartburn   [] Difficulty swallowing. Genitourinary:  [] Chronic kidney disease   [] Difficult urination  [] Frequent urination   [] Blood in urine Skin:  [] Rashes   [] Ulcers  Psychological:  [] History of anxiety   []  History of major depression.  Physical Examination  There were no vitals filed for this visit. There is no height or weight on file to calculate BMI. Gen: WD/WN, NAD Head: [place]/AT, No temporalis wasting.  Ear/Nose/Throat: Hearing grossly intact, nares w/o erythema or drainage, poor dentition Eyes: PER, EOMI, sclera nonicteric.  Neck: Supple, no masses.  No bruit or JVD.  Pulmonary:  Good air movement, clear to  auscultation bilaterally, no use of accessory muscles.  Cardiac: RRR, normal S1, S2, no Murmurs. Vascular: carotid bruits noted Vessel Right Left  Radial Palpable Palpable  Brachial Palpable Palpable  Carotid Palpable Palpable  Gastrointestinal: soft, non-distended. No guarding/no peritoneal signs.  Musculoskeletal: M/S 5/5 throughout.  No deformity or atrophy.  Neurologic: CN 2-12 intact. Pain and light touch intact in extremities.  Symmetrical.  Speech is fluent. Motor exam as listed above. Psychiatric: Judgment  intact, Mood & affect appropriate for pt's clinical situation. Dermatologic: No rashes or ulcers noted.  No changes consistent with cellulitis.   CBC Lab Results  Component Value Date   WBC 9.8 06/15/2020   HGB 11.4 (L) 06/15/2020   HCT 34.5 (L) 06/15/2020   MCV 87.1 06/15/2020   PLT 296 06/15/2020    BMET    Component Value Date/Time   NA 138 06/15/2020 1448   NA 137 01/27/2014 0144   K 4.2 06/15/2020 1448   K 4.1 01/27/2014 0144   CL 104 06/15/2020 1448   CL 102 01/27/2014 0144   CO2 26 06/15/2020 1448   CO2 27 01/27/2014 0144   GLUCOSE 193 (H) 06/15/2020 1448   GLUCOSE 401 (H) 01/27/2014 0144   BUN 28 (H) 06/15/2020 1448   BUN 18 01/27/2014 0144   CREATININE 0.68 06/15/2020 1448   CREATININE 0.93 01/27/2014 0144   CALCIUM 9.1 06/15/2020 1448   CALCIUM 8.4 (L) 01/27/2014 0144   GFRNONAA >60 06/15/2020 1448   GFRNONAA >60 01/27/2014 0144   GFRAA >60 01/27/2014 0144   Estimated Creatinine Clearance: 85.2 mL/min (by C-G formula based on SCr of 0.68 mg/dL).  COAG Lab Results  Component Value Date   INR 1.0 06/15/2020    Radiology CT HEAD WO CONTRAST  Result Date: 06/15/2020 CLINICAL DATA:  Provided history: Neuro deficit, acute, stroke suspected. Additional history provided: Left-sided numbness which began last night. EXAM: CT HEAD WITHOUT CONTRAST TECHNIQUE: Contiguous axial images were obtained from the base of the skull through the vertex without intravenous contrast. COMPARISON:  No pertinent prior exams available for comparison. FINDINGS: Brain: Cerebral volume is normal. 6 mm focal hypodensity within the right frontal lobe centrum semiovale (series 3, image 19) (series 5, image 22). Retrocerebellar CSF density prominence just to the left of midline measuring 2.1 x 2.4 cm in transaxial dimensions (series 3, image 13) (series 5, image 28). This likely reflects an incidental arachnoid cyst. There is no acute intracranial hemorrhage. No demarcated cortical infarct. No  extra-axial fluid collection. No evidence of intracranial mass. No midline shift. Partially empty sella turcica. Vascular: No hyperdense vessel.  Atherosclerotic calcifications. Skull: Normal. Negative for fracture or focal lesion. Sinuses/Orbits: Visualized orbits show no acute finding. Moderate sized fluid level within the left frontal sinus. Scattered mild mucosal thickening and possible fluid within the bilateral ethmoid air cells. IMPRESSION: 6 mm hypodensity within the right frontal lobe white matter, which may reflect an age-indeterminate lacunar infarct or prominent perivascular space. Given the provided history of left-sided weakness, consider a noncontrast brain MRI for further evaluation. 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 This finding is very commonly incidental, but can be associated with idiopathic intracranial hypertension. Otherwise unremarkable non-contrast CT appearance of the brain. Paranasal sinus disease, as described. Electronically Signed   By: Jackey Loge DO   On: 06/15/2020 15:12   MR ANGIO HEAD WO CONTRAST  Result Date: 06/15/2020 CLINICAL DATA:  Initial evaluation for acute stroke. EXAM: MRA NECK WITHOUT AND WITH CONTRAST MRA HEAD  WITHOUT CONTRAST TECHNIQUE: Multiplanar and multiecho pulse sequences of the neck were obtained without and with intravenous contrast. Angiographic images of the neck were obtained using MRA technique without and with intravenous contrast; Angiographic images of the Circle of Willis were obtained using MRA technique without intravenous contrast. CONTRAST:  53mL GADAVIST GADOBUTROL 1 MMOL/ML IV SOLN COMPARISON:  Prior brain MRI from earlier the same day. FINDINGS: MRA NECK FINDINGS AORTIC ARCH: Visualized aortic arch normal caliber with normal 3 vessel morphology. No hemodynamically significant stenosis seen about the origin of the great vessels. RIGHT CAROTID SYSTEM: Right CCA patent from its origin to the  bifurcation without stenosis. There is a severe 70-80% stenosis involving the proximal right ICA, just distal to the bifurcation (series 1059, image 7). Area of involvement essentially begins at the bifurcation, and measures approximately 1 cm in length. Distally, the right ICA is patent without stenosis, evidence for dissection, or occlusion. LEFT CAROTID SYSTEM: Left CCA patent from its origin to the bifurcation without stenosis. Mild atheromatous irregularity about the left carotid bulb/proximal left ICA without hemodynamically significant stenosis. Left ICA patent distally without stenosis, evidence for dissection or occlusion. VERTEBRAL ARTERIES: Both vertebral arteries arise from the subclavian arteries. Short-segment moderate approximate 50% stenosis at the origin of the right vertebral artery (series 1071, image 9). Vertebral arteries otherwise patent within the neck without stenosis, evidence for dissection, or occlusion. MRA HEAD FINDINGS ANTERIOR CIRCULATION: Examination degraded by motion artifact. Petrous segments patent bilaterally. Diffuse atheromatous irregularity with narrowing throughout the carotid siphons. Associated moderate to severe stenosis at the anterior genu of the cavernous right ICA (series 1042, image 3). Additional multifocal severe stenoses noted about the cavernous and para clinoid left ICA (series 1042, image 15). ICA termini well perfused. A1 segments patent. Normal anterior communicating artery complex. Anterior cerebral arteries patent to their distal aspects without appreciable stenosis. No M1 stenosis or occlusion. Normal MCA bifurcations. Distal MCA branches perfused and fairly symmetric. POSTERIOR CIRCULATION: Both V4 segments patent to the vertebrobasilar junction without stenosis. Left vertebral artery dominant. Left PICA origin patent and normal. Right PICA not seen. Irregularity within the proximal and mid basilar artery could be related to atheromatous disease versus  artifact. Similarly, an apparent short-segment moderate stenosis at the distal basilar artery could be related to artifact or atherosclerotic disease (sees series 1052, image 10). Superior cerebellar arteries patent proximally. Both PCAs primarily supplied via the basilar. Short-segment moderate stenosis at the left P1/P2 junction (series 1052, image 10). PCAs otherwise well perfused to their distal aspects without stenosis. No intracranial aneurysm. IMPRESSION: MRA HEAD IMPRESSION: 1. Motion degraded exam. 2. Atheromatous irregularity throughout the carotid siphons with associated moderate to severe stenoses, left worse than right. 3. Short-segment moderate stenosis at the left P1/P2 junction. 4. Atheromatous irregularity versus artifact and possible short-segment moderate stenosis at the distal basilar artery as above. MRA NECK IMPRESSION: 1. 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 Finding is likely the source of the acute watershed infarcts seen on prior brain MRI. Both carotid artery systems otherwise widely patent within the neck. 2. Short-segment moderate approximate 50% stenosis at the origin of the right vertebral artery. Vertebral arteries otherwise patent within the neck Electronically Signed   By: Rise Mu M.D.   On: 06/15/2020 23:23   MR ANGIO NECK W WO CONTRAST  Result Date: 06/15/2020 CLINICAL DATA:  Initial evaluation for acute stroke. EXAM: MRA NECK WITHOUT AND WITH CONTRAST MRA HEAD WITHOUT CONTRAST TECHNIQUE: Multiplanar and  multiecho pulse sequences of the neck were obtained without and with intravenous contrast. Angiographic images of the neck were obtained using MRA technique without and with intravenous contrast; Angiographic images of the Circle of Willis were obtained using MRA technique without intravenous contrast. CONTRAST:  7mL GADAVIST GADOBUTROL 1 MMOL/ML IV SOLN COMPARISON:  Prior brain MRI from earlier the same day.  FINDINGS: MRA NECK FINDINGS AORTIC ARCH: Visualized aortic arch normal caliber with normal 3 vessel morphology. No hemodynamically significant stenosis seen about the origin of the great vessels. RIGHT CAROTID SYSTEM: Right CCA patent from its origin to the bifurcation without stenosis. There is a severe 70-80% stenosis involving the proximal right ICA, just distal to the bifurcation (series 1059, image 7). Area of involvement essentially begins at the bifurcation, and measures approximately 1 cm in length. Distally, the right ICA is patent without stenosis, evidence for dissection, or occlusion. LEFT CAROTID SYSTEM: Left CCA patent from its origin to the bifurcation without stenosis. Mild atheromatous irregularity about the left carotid bulb/proximal left ICA without hemodynamically significant stenosis. Left ICA patent distally without stenosis, evidence for dissection or occlusion. VERTEBRAL ARTERIES: Both vertebral arteries arise from the subclavian arteries. Short-segment moderate approximate 50% stenosis at the origin of the right vertebral artery (series 1071, image 9). Vertebral arteries otherwise patent within the neck without stenosis, evidence for dissection, or occlusion. MRA HEAD FINDINGS ANTERIOR CIRCULATION: Examination degraded by motion artifact. Petrous segments patent bilaterally. Diffuse atheromatous irregularity with narrowing throughout the carotid siphons. Associated moderate to severe stenosis at the anterior genu of the cavernous right ICA (series 1042, image 3). Additional multifocal severe stenoses noted about the cavernous and para clinoid left ICA (series 1042, image 15). ICA termini well perfused. A1 segments patent. Normal anterior communicating artery complex. Anterior cerebral arteries patent to their distal aspects without appreciable stenosis. No M1 stenosis or occlusion. Normal MCA bifurcations. Distal MCA branches perfused and fairly symmetric. POSTERIOR CIRCULATION: Both V4  segments patent to the vertebrobasilar junction without stenosis. Left vertebral artery dominant. Left PICA origin patent and normal. Right PICA not seen. Irregularity within the proximal and mid basilar artery could be related to atheromatous disease versus artifact. Similarly, an apparent short-segment moderate stenosis at the distal basilar artery could be related to artifact or atherosclerotic disease (sees series 1052, image 10). Superior cerebellar arteries patent proximally. Both PCAs primarily supplied via the basilar. Short-segment moderate stenosis at the left P1/P2 junction (series 1052, image 10). PCAs otherwise well perfused to their distal aspects without stenosis. No intracranial aneurysm. IMPRESSION: MRA HEAD IMPRESSION: 1. Motion degraded exam. 2. Atheromatous irregularity throughout the carotid siphons with associated moderate to severe stenoses, left worse than right. 3. Short-segment moderate stenosis at the left P1/P2 junction. 4. Atheromatous irregularity versus artifact and possible short-segment moderate stenosis at the distal basilar artery as above. MRA NECK IMPRESSION: 1. 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 Finding is likely the source of the acute watershed infarcts seen on prior brain MRI. Both carotid artery systems otherwise widely patent within the neck. 2. Short-segment moderate approximate 50% stenosis at the origin of the right vertebral artery. Vertebral arteries otherwise patent within the neck Electronically Signed   By: Rise Mu M.D.   On: 06/15/2020 23:23   MR BRAIN WO CONTRAST  Result Date: 06/15/2020 CLINICAL DATA:  Brain mass or lesion. EXAM: MRI HEAD WITHOUT CONTRAST TECHNIQUE: Multiplanar, multiecho pulse sequences of the brain and surrounding structures were obtained without intravenous contrast. COMPARISON:  No pertinent prior exams available for comparison. FINDINGS: Brain: Cerebral volume is normal.  There are patchy small acute cortical/subcortical infarcts within the right MCA territory, as well as right MCA/ACA and MCA/PCA watershed territories, within the right frontal and parietal lobes. No evidence of intracranial mass. No chronic intracranial blood products. No extra-axial fluid collection. No midline shift. CSF intensity prominence posterior to the cerebellum just to the left of midline measuring 2.1 x 2.5 cm in transaxial dimensions, likely reflecting an incidental arachnoid cyst. Vascular: Expected proximal arterial flow voids. Skull and upper cervical spine: No focal marrow lesion. Sinuses/Orbits: Visualized orbits show no acute finding. Mild left greater than right frontal and bilateral ethmoid sinus mucosal thickening. Trace mucosal thickening is per also present within the bilateral sphenoid sinuses. Other: 7 mm round T2/FLAIR hyperintense focus within the right parotid gland (series 15, image 3). IMPRESSION: Patchy small acute cortical/subcortical infarcts within the right MCA territory, as well as right MCA/ACA and MCA/PCA watershed territories. These infarcts are present within the right frontal and parietal lobes. Consider MR or CT angiography of the head/neck for further evaluation. Incidentally noted left retrocerebellar arachnoid cyst. Incidentally noted 7 mm round T2/FLAIR hyperintense focus within the right parotid gland. This could reflect a prominent intraparotid lymph node. However, a primary parotid neoplasm cannot be excluded. Nonemergent targeted ultrasound recommended for further characterization. Paranasal sinus disease, as described. Electronically Signed   By: Jackey Loge DO   On: 06/15/2020 18:47   ECHOCARDIOGRAM COMPLETE BUBBLE STUDY  Result Date: 06/16/2020    ECHOCARDIOGRAM REPORT   Patient Name:   Sharon Gray Date of Exam: 06/16/2020 Medical Rec #:  [id number]   Height:       65.0 in Accession #:    [id number]  Weight:       177.7 lb Date of Birth:  [birth year]    BSA:           1.881 m Patient Age:    55 years    BP:           111/57 mmHg Patient Gender: F           HR:           86 bpm. Exam Location:  [facility] Procedure: 2D Echo, Cardiac Doppler, Color Doppler and Saline Contrast Bubble            Study Indications:     Stroke 434.91 /I63.9  History:         Patient has no prior history of Echocardiogram examinations.                  Risk Factors:Hypertension and Diabetes.  Sonographer:     Cristela Blue RDCS (AE) Referring Phys:  [id number] Malachi Carl STACK Diagnosing Phys: Julien Nordmann MD IMPRESSIONS  1. Left ventricular ejection fraction, by estimation, is 60 to 65%. The left ventricle has normal function. The left ventricle has no regional wall motion abnormalities. Left ventricular diastolic parameters were normal.  2. Right ventricular systolic function is normal. The right ventricular size is normal. There is normal pulmonary artery systolic pressure. The estimated right ventricular systolic pressure is 22.3 mmHg. FINDINGS  Left Ventricle: Left ventricular ejection fraction, by estimation, is 60 to 65%. The left ventricle has normal function. The left ventricle has no regional wall motion abnormalities. The left ventricular internal cavity size was normal in size. There is  no left ventricular hypertrophy. Left ventricular diastolic parameters were normal. Right Ventricle: The right ventricular size is normal.  No increase in right ventricular wall thickness. Right ventricular systolic function is normal. There is normal pulmonary artery systolic pressure. The tricuspid regurgitant velocity is 2.08 m/s, and  with an assumed right atrial pressure of 5 mmHg, the estimated right ventricular systolic pressure is 22.3 mmHg. Left Atrium: Left atrial size was normal in size. Right Atrium: Right atrial size was normal in size. Pericardium: There is no evidence of pericardial effusion. Mitral Valve: The mitral valve is normal in structure. No evidence of mitral valve regurgitation. No evidence  of mitral valve stenosis. Tricuspid Valve: The tricuspid valve is normal in structure. Tricuspid valve regurgitation is mild . No evidence of tricuspid stenosis. Aortic Valve: The aortic valve is normal in structure. Aortic valve regurgitation is not visualized. No aortic stenosis is present. Aortic valve mean gradient measures 6.0 mmHg. Aortic valve peak gradient measures 10.4 mmHg. Aortic valve area, by VTI measures 2.59 cm. Pulmonic Valve: The pulmonic valve was normal in structure. Pulmonic valve regurgitation is not visualized. No evidence of pulmonic stenosis. Aorta: The aortic root is normal in size and structure. Venous: The inferior vena cava is normal in size with greater than 50% respiratory variability, suggesting right atrial pressure of 3 mmHg. IAS/Shunts: No atrial level shunt detected by color flow Doppler. Agitated saline contrast was given intravenously to evaluate for intracardiac shunting. Agitated saline contrast bubble study was negative, with no evidence of any interatrial shunt.  LEFT VENTRICLE PLAX 2D LVIDd:         4.16 cm  Diastology LVIDs:         2.30 cm  LV e' medial:    11.00 cm/s LV PW:         1.09 cm  LV E/e' medial:  9.0 LV IVS:        0.78 cm  LV e' lateral:   15.90 cm/s LVOT diam:     2.00 cm  LV E/e' lateral: 6.2 LV SV:         79 LV SV Index:   42 LVOT Area:     3.14 cm  RIGHT VENTRICLE RV Basal diam:  1.89 cm RV S prime:     12.00 cm/s TAPSE (M-mode): 4.0 cm LEFT ATRIUM             Index       RIGHT ATRIUM           Index LA diam:        3.30 cm 1.75 cm/m  RA Area:     11.70 cm LA Vol (A2C):   53.4 ml 28.39 ml/m RA Volume:   23.70 ml  12.60 ml/m LA Vol (A4C):   43.1 ml 22.91 ml/m LA Biplane Vol: 48.2 ml 25.62 ml/m  AORTIC VALVE                    PULMONIC VALVE AV Area (Vmax):    2.07 cm     PV Vmax:        0.77 m/s AV Area (Vmean):   2.35 cm     PV Peak grad:   2.4 mmHg AV Area (VTI):     2.59 cm     RVOT Peak grad: 5 mmHg AV Vmax:           161.00 cm/s AV Vmean:           112.000 cm/s AV VTI:            0.304 m AV Peak Grad:  10.4 mmHg AV Mean Grad:      6.0 mmHg LVOT Vmax:         106.00 cm/s LVOT Vmean:        83.800 cm/s LVOT VTI:          0.251 m LVOT/AV VTI ratio: 0.83  AORTA Ao Root diam: 3.10 cm MITRAL VALVE                TRICUSPID VALVE MV Area (PHT): 5.62 cm     TR Peak grad:   17.3 mmHg MV Decel Time: 135 msec     TR Vmax:        208.00 cm/s MV E velocity: 98.50 cm/s MV A velocity: 104.00 cm/s  SHUNTS MV E/A ratio:  0.95         Systemic VTI:  0.25 m                             Systemic Diam: 2.00 cm Julien Nordmann MD Electronically signed by Julien Nordmann MD Signature Date/Time: 06/16/2020/6:04:35 PM    Final      Assessment/Plan 1. Carotid artery stenosis, symptomatic, right Recommend:  The patient is symptomatic with respect to the carotid stenosis.  The patient now has progressed and has a lesion the is >90%.  Patient's CT angiography of the carotid arteries confirms >90% right ICA stenosis.  The anatomical considerations support stenting over surgery.  This was discussed in detail with the patient.  The risks, benefits and alternative therapies were reviewed in detail with the patient.  All questions were answered.  The patient agrees to proceed with stenting of the right carotid artery.  Continue antiplatelet therapy as prescribed. Continue management of CAD, HTN and Hyperlipidemia. Healthy heart diet, encouraged exercise at least 4 times per week.   2. Hypertension associated with diabetes (HCC) Continue antihypertensive medications as already ordered, these medications have been reviewed and there are no changes at this time.   3. Type 2 diabetes mellitus with other circulatory complication, unspecified whether long term insulin use (HCC) Continue hypoglycemic medications as already ordered, these medications have been reviewed and there are no changes at this time.  Hgb A1C to be monitored as already arranged by primary  service   4. Acute CVA (cerebrovascular accident) (HCC) Continue antiplatelet therapy as ordered do Not stop the Plavix before stenting    [name], MD  06/27/2020 12:30 PM

## 2020-06-28 ENCOUNTER — Encounter (INDEPENDENT_AMBULATORY_CARE_PROVIDER_SITE_OTHER): Payer: Self-pay | Admitting: Vascular Surgery

## 2020-07-07 ENCOUNTER — Telehealth (INDEPENDENT_AMBULATORY_CARE_PROVIDER_SITE_OTHER): Payer: Self-pay

## 2020-07-07 ENCOUNTER — Encounter (INDEPENDENT_AMBULATORY_CARE_PROVIDER_SITE_OTHER): Payer: Self-pay

## 2020-07-07 NOTE — Telephone Encounter (Signed)
Spoke with the patient and spouse, the patient is scheduled with Dr. Gilda Crease for a right carotid stent on 07/26/20 with a 11:00 am arrival time to the [facility]. Covid testing on 07/22/20 between 8-12 pm at the [facility]. Pre-procedure instructions were discussed and will be mailed.

## 2020-07-13 ENCOUNTER — Telehealth (INDEPENDENT_AMBULATORY_CARE_PROVIDER_SITE_OTHER): Payer: Self-pay

## 2020-07-13 NOTE — Telephone Encounter (Signed)
Patient family member left voicemail requesting refill for Clopidogrel to be called into pharmacy. The requesting medication Clopidogrel 75mg  with 11 refills has been left on [facility] voicemail. Patient has been made aware with request has been sent.

## 2020-07-22 ENCOUNTER — Other Ambulatory Visit
Admission: RE | Admit: 2020-07-22 | Discharge: 2020-07-22 | Disposition: A | Discharge status: Home / Self Care | Payer: Medicaid Other | Source: Ambulatory Visit | Attending: Vascular Surgery | Admitting: Vascular Surgery

## 2020-07-22 ENCOUNTER — Other Ambulatory Visit: Payer: Medicaid Other

## 2020-07-22 ENCOUNTER — Other Ambulatory Visit: Payer: Self-pay

## 2020-07-22 DIAGNOSIS — Z01812 Encounter for preprocedural laboratory examination: Secondary | ICD-10-CM | POA: Diagnosis not present

## 2020-07-22 DIAGNOSIS — Z20822 Contact with and (suspected) exposure to covid-19: Secondary | ICD-10-CM | POA: Insufficient documentation

## 2020-07-22 LAB — SARS CORONAVIRUS 2 (TAT 6-24 HRS): SARS Coronavirus 2: NEGATIVE

## 2020-07-26 ENCOUNTER — Encounter: Payer: Self-pay | Admitting: Vascular Surgery

## 2020-07-26 ENCOUNTER — Inpatient Hospital Stay
Admission: RE | Admit: 2020-07-26 | Discharge: 2020-07-27 | DRG: 036 | Disposition: A | Discharge status: Home / Self Care | Payer: Medicaid Other | Attending: Vascular Surgery | Admitting: Vascular Surgery

## 2020-07-26 ENCOUNTER — Encounter
Admission: RE | Disposition: A | Discharge status: Home / Self Care | Payer: Self-pay | Source: Home / Self Care | Attending: Vascular Surgery

## 2020-07-26 ENCOUNTER — Other Ambulatory Visit (INDEPENDENT_AMBULATORY_CARE_PROVIDER_SITE_OTHER): Payer: Self-pay | Admitting: Nurse Practitioner

## 2020-07-26 ENCOUNTER — Other Ambulatory Visit: Payer: Self-pay

## 2020-07-26 DIAGNOSIS — I6521 Occlusion and stenosis of right carotid artery: Principal | ICD-10-CM | POA: Diagnosis present

## 2020-07-26 DIAGNOSIS — I2510 Atherosclerotic heart disease of native coronary artery without angina pectoris: Secondary | ICD-10-CM | POA: Diagnosis present

## 2020-07-26 DIAGNOSIS — I10 Essential (primary) hypertension: Secondary | ICD-10-CM | POA: Diagnosis present

## 2020-07-26 DIAGNOSIS — E119 Type 2 diabetes mellitus without complications: Secondary | ICD-10-CM | POA: Diagnosis present

## 2020-07-26 DIAGNOSIS — E785 Hyperlipidemia, unspecified: Secondary | ICD-10-CM | POA: Diagnosis present

## 2020-07-26 DIAGNOSIS — Z0000 Encounter for general adult medical examination without abnormal findings: Secondary | ICD-10-CM

## 2020-07-26 DIAGNOSIS — I63239 Cerebral infarction due to unspecified occlusion or stenosis of unspecified carotid arteries: Secondary | ICD-10-CM | POA: Diagnosis present

## 2020-07-26 DIAGNOSIS — Z8673 Personal history of transient ischemic attack (TIA), and cerebral infarction without residual deficits: Secondary | ICD-10-CM

## 2020-07-26 HISTORY — PX: CAROTID PTA/STENT INTERVENTION: CATH118231

## 2020-07-26 LAB — GLUCOSE, CAPILLARY
Glucose-Capillary: 195 mg/dL — ABNORMAL HIGH (ref 70–99)
Glucose-Capillary: 150 mg/dL — ABNORMAL HIGH (ref 70–99)
Glucose-Capillary: 119 mg/dL — ABNORMAL HIGH (ref 70–99)

## 2020-07-26 LAB — CREATININE, SERUM
Creatinine, Ser: 0.85 mg/dL (ref 0.44–1.00)
GFR, Estimated: >60 mL/min (ref >60)

## 2020-07-26 LAB — BUN: BUN: 33 mg/dL — ABNORMAL HIGH (ref 6–20)

## 2020-07-26 LAB — MRSA NEXT GEN BY PCR, NASAL: MRSA by PCR Next Gen: NOT DETECTED

## 2020-07-26 SURGERY — CAROTID PTA/STENT INTERVENTION
Anesthesia: Moderate Sedation | Laterality: Right

## 2020-07-26 MED ORDER — MIDAZOLAM HCL 2 MG/ML PO SYRP: 8.0000 mg | ORAL_SOLUTION | Freq: Once | ORAL | Status: DC | PRN

## 2020-07-26 MED ORDER — MIDAZOLAM HCL 5 MG/5ML IJ SOLN
INTRAMUSCULAR | Status: AC
  Filled 2020-07-26: qty 5

## 2020-07-26 MED ORDER — ONDANSETRON HCL 4 MG/2ML IJ SOLN: 4.0000 mg | Freq: Four times a day (QID) | INTRAMUSCULAR | Status: DC | PRN

## 2020-07-26 MED ORDER — FAMOTIDINE 20 MG PO TABS: 40.0000 mg | ORAL_TABLET | Freq: Once | ORAL | Status: DC | PRN

## 2020-07-26 MED ORDER — METOPROLOL TARTRATE 5 MG/5ML IV SOLN: 2.0000 mg | INTRAVENOUS | Status: DC | PRN

## 2020-07-26 MED ORDER — SODIUM CHLORIDE 0.9 % IV SOLN: INTRAVENOUS | Status: DC

## 2020-07-26 MED ORDER — ACETAMINOPHEN 500 MG PO TABS: 500.0000 mg | ORAL_TABLET | Freq: Four times a day (QID) | ORAL | Status: DC | PRN

## 2020-07-26 MED ORDER — HYDRALAZINE HCL 20 MG/ML IJ SOLN: 5.0000 mg | INTRAMUSCULAR | Status: DC | PRN

## 2020-07-26 MED ORDER — DIPHENHYDRAMINE HCL 50 MG/ML IJ SOLN: 50.0000 mg | Freq: Once | INTRAMUSCULAR | Status: DC | PRN

## 2020-07-26 MED ORDER — ACETAMINOPHEN 325 MG RE SUPP
325.0000 mg | RECTAL | Status: DC | PRN
  Filled 2020-07-26: qty 2

## 2020-07-26 MED ORDER — HEPARIN SODIUM (PORCINE) 1000 UNIT/ML IJ SOLN
INTRAMUSCULAR | Status: DC | PRN
  Administered 2020-07-26: 8000 [IU] via INTRAVENOUS

## 2020-07-26 MED ORDER — GLIMEPIRIDE 4 MG PO TABS
4.0000 mg | ORAL_TABLET | Freq: Two times a day (BID) | ORAL | Status: DC
  Administered 2020-07-27: 4 mg via ORAL
  Filled 2020-07-26 (×2): qty 1

## 2020-07-26 MED ORDER — PHENOL 1.4 % MT LIQD
1.0000 | OROMUCOSAL | Status: DC | PRN
  Filled 2020-07-26: qty 177

## 2020-07-26 MED ORDER — DOCUSATE SODIUM 100 MG PO CAPS
100.0000 mg | ORAL_CAPSULE | Freq: Every day | ORAL | Status: DC
  Administered 2020-07-27: 100 mg via ORAL
  Filled 2020-07-26: qty 1

## 2020-07-26 MED ORDER — ROSUVASTATIN CALCIUM 20 MG PO TABS
40.0000 mg | ORAL_TABLET | Freq: Every day | ORAL | Status: DC
  Administered 2020-07-27: 40 mg via ORAL
  Filled 2020-07-26: qty 2

## 2020-07-26 MED ORDER — CEFAZOLIN SODIUM-DEXTROSE 2-4 GM/100ML-% IV SOLN
INTRAVENOUS | Status: AC
  Administered 2020-07-26: 2 g via INTRAVENOUS
  Filled 2020-07-26: qty 100

## 2020-07-26 MED ORDER — LABETALOL HCL 5 MG/ML IV SOLN: 10.0000 mg | INTRAVENOUS | Status: DC | PRN

## 2020-07-26 MED ORDER — HEPARIN SODIUM (PORCINE) 1000 UNIT/ML IJ SOLN
INTRAMUSCULAR | Status: AC
  Filled 2020-07-26: qty 1

## 2020-07-26 MED ORDER — PANTOPRAZOLE SODIUM 40 MG IV SOLR
40.0000 mg | Freq: Every day | INTRAVENOUS | Status: DC
  Administered 2020-07-26: 40 mg via INTRAVENOUS
  Filled 2020-07-26: qty 40

## 2020-07-26 MED ORDER — ASPIRIN EC 81 MG PO TBEC
81.0000 mg | DELAYED_RELEASE_TABLET | Freq: Every day | ORAL | Status: DC
  Administered 2020-07-27: 81 mg via ORAL
  Filled 2020-07-26: qty 1

## 2020-07-26 MED ORDER — ACETAMINOPHEN 325 MG PO TABS: 325.0000 mg | ORAL_TABLET | ORAL | Status: DC | PRN

## 2020-07-26 MED ORDER — ATROPINE SULFATE 1 MG/10ML IJ SOSY
PREFILLED_SYRINGE | INTRAMUSCULAR | Status: DC | PRN
  Administered 2020-07-26: 1 mg via INTRAVENOUS

## 2020-07-26 MED ORDER — MIDAZOLAM HCL 2 MG/2ML IJ SOLN
INTRAMUSCULAR | Status: DC | PRN
  Administered 2020-07-26: 2 mg via INTRAVENOUS

## 2020-07-26 MED ORDER — CLOPIDOGREL BISULFATE 75 MG PO TABS
75.0000 mg | ORAL_TABLET | Freq: Every day | ORAL | Status: DC
  Administered 2020-07-26 – 2020-07-27 (×2): 75 mg via ORAL
  Filled 2020-07-26 (×2): qty 1

## 2020-07-26 MED ORDER — CEFAZOLIN SODIUM-DEXTROSE 2-4 GM/100ML-% IV SOLN
2.0000 g | Freq: Three times a day (TID) | INTRAVENOUS | Status: AC
  Administered 2020-07-26 – 2020-07-27 (×2): 2 g via INTRAVENOUS
  Filled 2020-07-26 (×2): qty 100

## 2020-07-26 MED ORDER — KCL IN DEXTROSE-NACL 20-5-0.9 MEQ/L-%-% IV SOLN
INTRAVENOUS | Status: DC
  Filled 2020-07-26 (×2): qty 1000

## 2020-07-26 MED ORDER — CEFAZOLIN SODIUM-DEXTROSE 2-4 GM/100ML-% IV SOLN: 2.0000 g | Freq: Once | INTRAVENOUS | Status: AC

## 2020-07-26 MED ORDER — FENTANYL CITRATE (PF) 100 MCG/2ML IJ SOLN
INTRAMUSCULAR | Status: AC
  Filled 2020-07-26: qty 2

## 2020-07-26 MED ORDER — ASPIRIN EC 81 MG PO TBEC: 81.0000 mg | DELAYED_RELEASE_TABLET | Freq: Every day | ORAL | Status: DC

## 2020-07-26 MED ORDER — HYDROMORPHONE HCL 1 MG/ML IJ SOLN: 1.0000 mg | Freq: Once | INTRAMUSCULAR | Status: DC | PRN

## 2020-07-26 MED ORDER — METHYLPREDNISOLONE SODIUM SUCC 125 MG IJ SOLR: 125.0000 mg | Freq: Once | INTRAMUSCULAR | Status: DC | PRN

## 2020-07-26 MED ORDER — PHENYLEPHRINE HCL (PRESSORS) 10 MG/ML IV SOLN
INTRAVENOUS | Status: AC
  Filled 2020-07-26: qty 1

## 2020-07-26 MED ORDER — ASPIRIN 325 MG PO TABS
325.0000 mg | ORAL_TABLET | ORAL | Status: AC
  Filled 2020-07-26: qty 1

## 2020-07-26 MED ORDER — FENTANYL CITRATE (PF) 100 MCG/2ML IJ SOLN
INTRAMUSCULAR | Status: DC | PRN
  Administered 2020-07-26: 50 ug via INTRAVENOUS
  Administered 2020-07-26: 25 ug via INTRAVENOUS

## 2020-07-26 MED ORDER — ADULT MULTIVITAMIN W/MINERALS CH
1.0000 | ORAL_TABLET | Freq: Every day | ORAL | Status: DC
  Administered 2020-07-26 – 2020-07-27 (×2): 1 via ORAL
  Filled 2020-07-26 (×2): qty 1

## 2020-07-26 MED ORDER — SODIUM CHLORIDE 0.9 % IV SOLN: 500.0000 mL | Freq: Once | INTRAVENOUS | Status: DC | PRN

## 2020-07-26 MED ORDER — VITAMIN D (ERGOCALCIFEROL) 1.25 MG (50000 UNIT) PO CAPS: 50000.0000 [IU] | ORAL_CAPSULE | ORAL | Status: DC

## 2020-07-26 MED ORDER — GUAIFENESIN-DM 100-10 MG/5ML PO SYRP: 15.0000 mL | ORAL_SOLUTION | ORAL | Status: DC | PRN

## 2020-07-26 MED ORDER — ALUM & MAG HYDROXIDE-SIMETH 200-200-20 MG/5ML PO SUSP: 15.0000 mL | ORAL | Status: DC | PRN

## 2020-07-26 SURGICAL SUPPLY — 19 items
BALLN VIATRAC 5X20X135 (BALLOONS) ×2
BALLOON VIATRAC 5X20X135 (BALLOONS) ×1 IMPLANT
CATH ANGIO 5F PIGTAIL 100CM (CATHETERS) ×2 IMPLANT
CATH BEACON 5 .035 100 H1 TIP (CATHETERS) ×2 IMPLANT
COVER PROBE U/S 5X48 (MISCELLANEOUS) ×2 IMPLANT
DEVICE EMBOSHIELD NAV6 4.0-7.0 (FILTER) ×2 IMPLANT
DEVICE STARCLOSE SE CLOSURE (Vascular Products) ×2 IMPLANT
DEVICE TORQUE (MISCELLANEOUS) ×2 IMPLANT
GLIDEWIRE ANGLED SS 035X260CM (WIRE) ×2 IMPLANT
GUIDEWIRE VASC STIFF .038X260 (WIRE) ×2 IMPLANT
KIT CAROTID MANIFOLD (MISCELLANEOUS) ×2 IMPLANT
KIT ENCORE 26 ADVANTAGE (KITS) ×2 IMPLANT
KIT MICROPUNCTURE NIT STIFF (SHEATH) ×2 IMPLANT
NEEDLE ENTRY 21GA 7CM ECHOTIP (NEEDLE) ×2 IMPLANT
PACK ANGIOGRAPHY (CUSTOM PROCEDURE TRAY) ×2 IMPLANT
SHEATH BRITE TIP 5FRX11 (SHEATH) ×2 IMPLANT
SHEATH SHUTTLE 6FRX80 (SHEATH) ×2 IMPLANT
STENT XACT CAR 10-8X40X136 (Permanent Stent) ×2 IMPLANT
WIRE GUIDERIGHT .035X150 (WIRE) ×2 IMPLANT

## 2020-07-26 NOTE — Progress Notes (Signed)
Dr. Gilda Crease at bedside, speaking with pt. And her husband re: procedural results. Both verbalize understanding of conversation.

## 2020-07-26 NOTE — Progress Notes (Addendum)
1600 Patient voided bloody urine with a clot. Dr. Gilda Crease notified. No signs or symptoms of a stroke.

## 2020-07-26 NOTE — Interval H&P Note (Signed)
History and Physical Interval Note:  07/26/2020 12:08 PM  Sharon Gray  has presented today for surgery, with the diagnosis of RT Carotid Stent Placement  ABBOTT  Carotid artery stenosis Covid  July 1.  The various methods of treatment have been discussed with the patient and family. After consideration of risks, benefits and other options for treatment, the patient has consented to  Procedure(s): CAROTID PTA/STENT INTERVENTION (Right) as a surgical intervention.  The patient's history has been reviewed, patient examined, no change in status, stable for surgery.  I have reviewed the patient's chart and labs.  Questions were answered to the patient's satisfaction.     [name]

## 2020-07-26 NOTE — Op Note (Signed)
OPERATIVE NOTE DATE: 07/26/2020  PROCEDURE:  Ultrasound guidance for vascular access right femoral artery  Placement of a 10 x 8 x 40 exact stent with the use of the NAV-6 embolic protection device in the right internal carotid artery  PRE-OPERATIVE DIAGNOSIS: 1.  Symptomatic right carotid artery stenosis. 2.  Right hemispheric CVA  POST-OPERATIVE DIAGNOSIS:  Same as above  SURGEON: [name]  ASSISTANT(S): None  ANESTHESIA: local/MCS  ESTIMATED BLOOD LOSS: 50 cc  CONTRAST: 65 cc  FLUORO TIME: 6.5 minutes  MODERATE CONSCIOUS SEDATION TIME: Continuous ECG pulse oximetry and cardiopulmonary monitoring was performed throughout the entire procedure by the interventional radiology nurse total sedation time was 59 minutes and 59 seconds.  FINDING(S): 1.   Greater than 90% right internal carotid artery stenosis  SPECIMEN(S):   none  INDICATIONS:   Patient is a 55 y.o. female who presents with critical right internal carotid artery stenosis.  The patient has a CVA from which she is recovered significantly as well as a very high bifurcation and carotid artery stenting was felt to be preferred to endarterectomy for that reason.  Risks and benefits were discussed and informed consent was obtained.   DESCRIPTION: After obtaining full informed written consent, the patient was brought back to the vascular suite and placed supine upon the table.  The patient received IV antibiotics prior to induction. Moderate conscious sedation was administered during a face to face encounter with the patient throughout the procedure with my supervision of the RN administering medicines and monitoring the patients vital signs and mental status throughout from the start of the procedure until the patient was taken to the recovery room.    After obtaining adequate sedation, the patient was prepped and draped in the standard fashion.    A first assistant is required in order to allow for a safe and  more efficient operation.  Duties include wire manipulations as well as assistance with pinning the sheath and positioning the detector for proper angle, assistance and deploying the stent in the proper position and appropriate images.  Further duties include assisting with patient positioning during the procedure.  I believe that this procedure requires a first assistant in order for it to be performed at a level in keeping with the high standards of this institution.  The right femoral artery was visualized with ultrasound and found to be widely patent. It was then accessed under direct ultrasound guidance without difficulty with a micropuncture needle. A permanent image was recorded.  A microwire was then advanced without difficulty under fluoroscopic guidance followed by a micro-sheath.  A J-wire was placed and we then placed a 6 French sheath. The patient was then heparinized and a total of 8000 units of intravenous heparin were given and an ACT was checked to confirm successful anticoagulation.   A pigtail catheter was then placed into the ascending aorta. This showed type I arch origins of the great vessels were widely patent. The right common carotid artery was then selectively cannulated without difficulty with a H1 catheter and the catheter advanced into the mid right common carotid artery.  Cervical and cerebral carotid angiography was then performed. There were no obvious intracranial filling defects. The carotid bifurcation demonstrated greater than 90% stenosis at the origin of the right internal carotid artery.  I then advanced the H1 catheter and Glidewire into the external carotid artery and then exchanged for the Amplatz Super Stiff wire. Over the Amplatz Super Stiff wire, a 6 [place] shuttle sheath  was placed into the mid common carotid artery. I then used the NAV-6  Embolic protection device and crossed the lesion and parked this in the distal internal carotid artery at the base of the skull.   I then selected a 10 x 8 x 40 exact stent. This was deployed across the lesion encompassing it in its entirety. A 5 mm x 20 mm length balloon was used to post dilate the stent. Only about a 5% residual stenosis was present after angioplasty. Completion angiogram showed normal intracranial filling without new defects. At this point I elected to terminate the procedure. The sheath was removed and StarClose closure device was deployed in the right femoral artery with excellent hemostatic result. The patient was taken to the recovery room in stable condition having tolerated the procedure well.  COMPLICATIONS: none  CONDITION: stable  [name] 07/26/2020 1:39 PM   This note was created with Dragon Medical transcription system. Any errors in dictation are purely unintentional.

## 2020-07-27 DIAGNOSIS — I6521 Occlusion and stenosis of right carotid artery: Principal | ICD-10-CM

## 2020-07-27 LAB — CBC
HCT: 33.4 % — ABNORMAL LOW (ref 36.0–46.0)
Hemoglobin: 10.9 g/dL — ABNORMAL LOW (ref 12.0–15.0)
MCH: 28.8 pg (ref 26.0–34.0)
MCHC: 32.6 g/dL (ref 30.0–36.0)
MCV: 88.1 fL (ref 80.0–100.0)
Platelets: 213 10*3/uL (ref 150–400)
RBC: 3.79 MIL/uL — ABNORMAL LOW (ref 3.87–5.11)
RDW: 12.4 % (ref 11.5–15.5)
WBC: 8.9 10*3/uL (ref 4.0–10.5)
nRBC: 0 % (ref 0.0–0.2)

## 2020-07-27 LAB — BASIC METABOLIC PANEL
Anion gap: 4 — ABNORMAL LOW (ref 5–15)
BUN: 27 mg/dL — ABNORMAL HIGH (ref 6–20)
CO2: 26 mmol/L (ref 22–32)
Calcium: 8.7 mg/dL — ABNORMAL LOW (ref 8.9–10.3)
Chloride: 106 mmol/L (ref 98–111)
Creatinine, Ser: 0.82 mg/dL (ref 0.44–1.00)
GFR, Estimated: >60 mL/min (ref >60)
Glucose, Bld: 117 mg/dL — ABNORMAL HIGH (ref 70–99)
Potassium: 4 mmol/L (ref 3.5–5.1)
Sodium: 136 mmol/L (ref 135–145)

## 2020-07-27 LAB — GLUCOSE, CAPILLARY: Glucose-Capillary: 112 mg/dL — ABNORMAL HIGH (ref 70–99)

## 2020-07-27 MED ORDER — CHLORHEXIDINE GLUCONATE CLOTH 2 % EX PADS
6.0000 | MEDICATED_PAD | Freq: Every day | CUTANEOUS | Status: DC
  Administered 2020-07-27: 6 via TOPICAL

## 2020-07-27 MED ORDER — ASPIRIN 81 MG PO TBEC: 81.0000 mg | DELAYED_RELEASE_TABLET | Freq: Every day | ORAL | 3 refills | Status: AC

## 2020-07-27 MED ORDER — CLOPIDOGREL BISULFATE 75 MG PO TABS: 75.0000 mg | ORAL_TABLET | Freq: Every day | ORAL | 3 refills | Status: DC

## 2020-07-27 NOTE — Discharge Instructions (Addendum)
1) You may shower as of tomorrow. Please remove your dressing and gently clean with soap and water. Gently pat dry.  2) Please do not engage in strenuous activity or lifting greater than 10 pounds for two weeks. 3) Please do not drive for two weeks.

## 2020-07-27 NOTE — Progress Notes (Signed)
Pt discharged with husband and son. AVS and discharge instructions given to pt and husband (caregiver). No other issues noted.

## 2020-07-28 LAB — POCT ACTIVATED CLOTTING TIME: Activated Clotting Time: 260 seconds

## 2020-07-28 NOTE — Discharge Summary (Addendum)
[facility]    Discharge Summary  Patient ID:  Sharon Gray MRN: [id number] DOB/AGE: [birth year] 55 y.o.  Admit date: 07/26/2020 Discharge date: 07/27/2020 Date of Surgery: 07/26/2020 Surgeon: Surgeon(s): Charity Tessier, Latina Craver, MD  Admission Diagnosis: Carotid stenosis, symptomatic, with infarction [facility]) [I63.239]  Discharge Diagnoses:  Carotid stenosis, symptomatic, with infarction [facility]) [I63.239]  Secondary Diagnoses: Past Medical History:  Diagnosis Date   Diabetes mellitus without complication (HCC)    Hypertension    Procedure(s): 07/26/20:  Ultrasound guidance for vascular access right femoral artery  Placement of a 10 x 8 x 40 exact stent with the use of the NAV-6 embolic protection device in the right internal carotid artery  Discharged Condition: Good  HPI / Hospital Course:  Patient is a 55 y.o. female who presents with critical right internal carotid artery stenosis.  The patient has a CVA from which she is recovered significantly as well as a very high bifurcation and carotid artery stenting was felt to be preferred to endarterectomy for that reason.  Risks and benefits were discussed and informed consent was obtained. On 07/26/20, the patient underwent:   Ultrasound guidance for vascular access right femoral artery  Placement of a 10 x 8 x 40 exact stent with the use of the NAV-6 embolic protection device in the right internal carotid artery  The patient tolerated the procedure well and was transferred from the angiography suite to the ICU for observation overnight. The patients night of surgery was unremarkable. During her brief stay, her diet was advanced, she was urinating on her own, pain was controlled with PO pain meds and she was ambulating at baseline. Day of discharge, she was afebrile with stable vitals signs.  Physical exam:  A&Ox3, NAD Face: Symmetrical, tongue midline Neck: trachea midline CV: RRR Pulm: CTA bilaterally Abdomen: soft,  NT, ND Right Groin:   Access Site: clean, dry and intact Extremity: warm to toe  Neuro: intact, no deficits   Labs: As below  Complications: None  Consults: None  Significant Diagnostic Studies: CBC Lab Results  Component Value Date   WBC 8.9 07/27/2020   HGB 10.9 (L) 07/27/2020   HCT 33.4 (L) 07/27/2020   MCV 88.1 07/27/2020   PLT 213 07/27/2020   BMET    Component Value Date/Time   NA 136 07/27/2020 0526   NA 137 01/27/2014 0144   K 4.0 07/27/2020 0526   K 4.1 01/27/2014 0144   CL 106 07/27/2020 0526   CL 102 01/27/2014 0144   CO2 26 07/27/2020 0526   CO2 27 01/27/2014 0144   GLUCOSE 117 (H) 07/27/2020 0526   GLUCOSE 401 (H) 01/27/2014 0144   BUN 27 (H) 07/27/2020 0526   BUN 18 01/27/2014 0144   CREATININE 0.82 07/27/2020 0526   CREATININE 0.93 01/27/2014 0144   CALCIUM 8.7 (L) 07/27/2020 0526   CALCIUM 8.4 (L) 01/27/2014 0144   GFRNONAA >60 07/27/2020 0526   GFRNONAA >60 01/27/2014 0144   GFRAA >60 01/27/2014 0144   COAG Lab Results  Component Value Date   INR 1.0 06/15/2020   Disposition:  Discharge to :Home  Allergies as of 07/27/2020   No Known Allergies      Medication List     TAKE these medications    acetaminophen 500 MG tablet Commonly known as: TYLENOL Take 500 mg by mouth every 6 (six) hours as needed for moderate pain or mild pain.   aspirin 81 MG EC tablet Take 1 tablet (81 mg total) by  mouth daily. Swallow whole.   clopidogrel 75 MG tablet Commonly known as: PLAVIX Take 1 tablet (75 mg total) by mouth daily.   glimepiride 4 MG tablet Commonly known as: AMARYL Take 4 mg by mouth 2 (two) times daily.   multivitamin tablet Take 1 tablet by mouth daily.   rosuvastatin 40 MG tablet Commonly known as: CRESTOR Take 40 mg by mouth daily.   TEARS NATURALE OP Place 1 drop into both eyes daily as needed (itchy eye).   Vitamin D (Ergocalciferol) 1.25 MG (50000 UNIT) Caps capsule Commonly known as: DRISDOL Take 50,000 Units by  mouth once a week.       Verbal and written Discharge instructions given to the patient. Wound care per Discharge AVS  Follow-up Information     Eliany Mccarter, Latina Craver, MD Follow up in 2 week(s).   Specialties: Vascular Surgery, Cardiology, Radiology, Vascular Surgery Why: Can see Minahil Quinlivan or Vivia Birmingham. Will need carotid duplex with study. Contact information: [address] [place] [address] [phone]                Signed: Tonette Lederer, PA-C 07/28/2020, 9:36 AM

## 2020-08-11 ENCOUNTER — Encounter: Payer: Self-pay | Admitting: Urology

## 2020-08-11 ENCOUNTER — Other Ambulatory Visit: Payer: Self-pay

## 2020-08-11 ENCOUNTER — Ambulatory Visit (INDEPENDENT_AMBULATORY_CARE_PROVIDER_SITE_OTHER): Payer: Medicaid Other | Admitting: Urology

## 2020-08-11 VITALS — BP 135/80 | HR 91 | Ht 63.0 in | Wt 173.0 lb

## 2020-08-11 DIAGNOSIS — R310 Gross hematuria: Secondary | ICD-10-CM

## 2020-08-11 DIAGNOSIS — R3129 Other microscopic hematuria: Secondary | ICD-10-CM

## 2020-08-11 NOTE — Progress Notes (Signed)
   08/11/20 2:03 PM   Aldona Bar [birth year] [id number]  CC: Gross hematuria  HPI: I saw Ms. Phariss and her husband for evaluation of gross hematuria.  She is a very comorbid 55 year old female recently started on anticoagulation after carotid surgery who reportedly had about a month of significant gross hematuria with clots.  Notably, urinalysis appeared grossly infected on 06/15/2020 with greater than 50 WBCs, rare bacteria, WBC clumps, 0-5 RBCs, large leukocytes, nitrite positive, but this was not sent for culture.  It sounds like at some point she was treated with antibiotics by her PCP for possible UTI, and her gross hematuria resolved after this.  She reportedly had a repeat urinalysis with PCP that showed persistent microscopic hematuria, but those results are unavailable to me.  She has had no further gross hematuria and denies any urinary symptoms at this time.  She is a never smoker and denies any other carcinogenic exposures.  No recent cross-sectional abdominal imaging to review  Urinalysis today with 3-10 RBCs but otherwise benign   PMH: Past Medical History:  Diagnosis Date   Diabetes mellitus without complication (HCC)    Hypertension     Surgical History: Past Surgical History:  Procedure Laterality Date   APPENDECTOMY     CAROTID PTA/STENT INTERVENTION Right 07/26/2020   Procedure: CAROTID PTA/STENT INTERVENTION;  Surgeon: Renford Dills, MD;  Location: [facility] INVASIVE CV LAB;  Service: Cardiovascular;  Laterality: Right;   NO PAST SURGERIES        Family History: Family History  Problem Relation Age of Onset   Stroke Father     Social History:  reports that she has never smoked. She has never used smokeless tobacco. She reports current alcohol use. She reports previous drug use.  Physical Exam: BP 135/80 (BP Location: Left Arm, Patient Position: Sitting, Cuff Size: Large)   Pulse 91   Ht 5\' 3"  (1.6 m)   Wt 173 lb (78.5 kg)   BMI 30.65 kg/m     Constitutional:  Alert and oriented, No acute distress. Cardiovascular: No clubbing, cyanosis, or edema. Respiratory: Normal respiratory effort, no increased work of breathing. GI: Abdomen is soft, nontender, nondistended, no abdominal masses  Laboratory Data: Reviewed  Assessment & Plan:   55 year old female on anticoagulation for recent carotid surgery who had a suspected UTI in 06/15/2020 with significant amount of gross hematuria.  This was not sent for culture, and its unclear when she was started on antibiotics, however she reports that her gross hematuria resolved completely after course of antibiotics.  With her history of significant gross hematuria as well as persistent microscopic hematuria today I recommended further evaluation per the AUA guidelines with CT urogram and cystoscopy  06/17/2020, MD 08/11/2020  [facility] [address], [address] [place], [place] [phone]

## 2020-08-11 NOTE — Patient Instructions (Signed)
Cystoscopy Cystoscopy is a procedure that is used to help diagnose and sometimes treat conditions that affect the lower urinary tract. The lower urinary tract includes the bladder and the urethra. The urethra is the tube that drains urine from the bladder. Cystoscopy is done using a thin, tube-shaped instrument with a light and camera at the end (cystoscope). The cystoscope may be hard or flexible, depending on the goal of the procedure. The cystoscope is inserted through the urethra, into the bladder. Cystoscopy may be recommended if you have: Urinary tract infections that keep coming back. Blood in the urine (hematuria). An inability to control when you urinate (urinary incontinence) or an overactive bladder. Unusual cells found in a urine sample. A blockage in the urethra, such as a urinary stone. Painful urination. An abnormality in the bladder found during an intravenous pyelogram (IVP) or CT scan. Cystoscopy may also be done to remove a sample of tissue to be examined under a microscope (biopsy). What are the risks? Generally, this is a safe procedure. However, problems may occur, including: Infection. Bleeding.  What happens during the procedure?  You will be given one or more of the following: A medicine to numb the area (local anesthetic). The area around the opening of your urethra will be cleaned. The cystoscope will be passed through your urethra into your bladder. Germ-free (sterile) fluid will flow through the cystoscope to fill your bladder. The fluid will stretch your bladder so that your health care provider can clearly examine your bladder walls. Your doctor will look at the urethra and bladder. The cystoscope will be removed The procedure may vary among health care providers  What can I expect after the procedure? After the procedure, it is common to have: Some soreness or pain in your abdomen and urethra. Urinary symptoms. These include: Mild pain or burning when you  urinate. Pain should stop within a few minutes after you urinate. This may last for up to 1 week. A small amount of blood in your urine for several days. Feeling like you need to urinate but producing only a small amount of urine. Follow these instructions at home: General instructions Return to your normal activities as told by your health care provider.  Do not drive for 24 hours if you were given a sedative during your procedure. Watch for any blood in your urine. If the amount of blood in your urine increases, call your health care provider. If a tissue sample was removed for testing (biopsy) during your procedure, it is up to you to get your test results. Ask your health care provider, or the department that is doing the test, when your results will be ready. Drink enough fluid to keep your urine pale yellow. Keep all follow-up visits as told by your health care provider. This is important. Contact a health care provider if you: Have pain that gets worse or does not get better with medicine, especially pain when you urinate. Have trouble urinating. Have more blood in your urine. Get help right away if you: Have blood clots in your urine. Have abdominal pain. Have a fever or chills. Are unable to urinate. Summary Cystoscopy is a procedure that is used to help diagnose and sometimes treat conditions that affect the lower urinary tract. Cystoscopy is done using a thin, tube-shaped instrument with a light and camera at the end. After the procedure, it is common to have some soreness or pain in your abdomen and urethra. Watch for any blood in your urine.   If the amount of blood in your urine increases, call your health care provider. If you were prescribed an antibiotic medicine, take it as told by your health care provider. Do not stop taking the antibiotic even if you start to feel better. This information is not intended to replace advice given to you by your health care provider. Make  sure you discuss any questions you have with your health care provider. Document Revised: 12/31/2017 Document Reviewed: 12/31/2017 Elsevier Patient Education  2020 [employer]  

## 2020-08-12 ENCOUNTER — Telehealth: Payer: Self-pay

## 2020-08-12 DIAGNOSIS — R3129 Other microscopic hematuria: Secondary | ICD-10-CM

## 2020-08-12 LAB — MICROSCOPIC EXAMINATION: Bacteria, UA: NONE SEEN

## 2020-08-12 LAB — URINALYSIS, COMPLETE
Bilirubin, UA: NEGATIVE
Ketones, UA: NEGATIVE
Leukocytes,UA: NEGATIVE
Nitrite, UA: NEGATIVE
Protein,UA: NEGATIVE
Specific Gravity, UA: 1.01 (ref 1.005–1.030)
Urobilinogen, Ur: 0.2 mg/dL (ref 0.2–1.0)
pH, UA: 5 (ref 5.0–7.5)

## 2020-08-12 NOTE — Telephone Encounter (Signed)
-----   Message from Sondra Come, MD sent at 08/11/2020  2:22 PM EDT ----- Regarding: Urine results Urine sample still showed a small amount of blood.  Recommend CT hematuria and cystoscopy for further evaluation, thanks  Legrand Rams, MD 08/11/2020

## 2020-08-12 NOTE — Telephone Encounter (Signed)
Called pt and her husband, informed them of the information below. Pt voiced understanding. Cysto scheduled. CT scan ordered.

## 2020-08-17 ENCOUNTER — Other Ambulatory Visit (INDEPENDENT_AMBULATORY_CARE_PROVIDER_SITE_OTHER): Payer: Self-pay | Admitting: Vascular Surgery

## 2020-08-17 DIAGNOSIS — Z959 Presence of cardiac and vascular implant and graft, unspecified: Secondary | ICD-10-CM

## 2020-08-17 DIAGNOSIS — I6521 Occlusion and stenosis of right carotid artery: Secondary | ICD-10-CM

## 2020-08-18 ENCOUNTER — Ambulatory Visit (INDEPENDENT_AMBULATORY_CARE_PROVIDER_SITE_OTHER): Payer: Medicaid Other

## 2020-08-18 ENCOUNTER — Encounter (INDEPENDENT_AMBULATORY_CARE_PROVIDER_SITE_OTHER): Payer: Self-pay | Admitting: Nurse Practitioner

## 2020-08-18 ENCOUNTER — Other Ambulatory Visit: Payer: Self-pay

## 2020-08-18 ENCOUNTER — Ambulatory Visit (INDEPENDENT_AMBULATORY_CARE_PROVIDER_SITE_OTHER): Payer: Medicaid Other | Admitting: Nurse Practitioner

## 2020-08-18 VITALS — BP 130/82 | HR 81 | Ht 63.0 in | Wt 173.0 lb

## 2020-08-18 DIAGNOSIS — I152 Hypertension secondary to endocrine disorders: Secondary | ICD-10-CM | POA: Diagnosis not present

## 2020-08-18 DIAGNOSIS — E1159 Type 2 diabetes mellitus with other circulatory complications: Secondary | ICD-10-CM | POA: Diagnosis not present

## 2020-08-18 DIAGNOSIS — I63239 Cerebral infarction due to unspecified occlusion or stenosis of unspecified carotid arteries: Secondary | ICD-10-CM | POA: Diagnosis not present

## 2020-08-18 DIAGNOSIS — I6521 Occlusion and stenosis of right carotid artery: Secondary | ICD-10-CM

## 2020-08-18 DIAGNOSIS — Z959 Presence of cardiac and vascular implant and graft, unspecified: Secondary | ICD-10-CM

## 2020-08-18 NOTE — Progress Notes (Signed)
Subjective:    Patient ID: Sharon Gray, female    DOB: 1965/07/11, 55 y.o.   MRN: [id number] Chief Complaint  Patient presents with   Follow-up    Ultrasound and post carotid stent    The patient is seen for follow up evaluation of carotid stenosis status post right  carotid stent on 07/26/2020.  There were no post operative problems or complications related to the surgery.  The patient denies neck or incisional pain.  The patient denies interval amaurosis fugax. There is no recent history of TIA symptoms or focal motor deficits. There is no prior documented CVA.  The patient denies headache.  The patient is taking enteric-coated aspirin 81 mg daily.  The patient has a history of coronary artery disease, no recent episodes of angina or shortness of breath. The patient denies PAD or claudication symptoms. There is a history of hyperlipidemia which is being treated with a statin.    Today noninvasive studies show 1 to 39% stenosis of the right ICA with a widely patent stent placement.  The left ICA is a 40 to 59% stenosis however there is minimal plaque noted.  There is an apparent kink in the mid ICA which appears elevated velocities but the ICA itself is widely patent.   Review of Systems  Neurological:  Negative for weakness and numbness.  All other systems reviewed and are negative.     Objective:   Physical Exam Vitals reviewed.  HENT:     Head: Normocephalic.  Neck:     Vascular: Carotid bruit present.  Cardiovascular:     Rate and Rhythm: Normal rate.     Pulses: Normal pulses.  Pulmonary:     Effort: Pulmonary effort is normal.  Skin:    General: Skin is warm and dry.  Neurological:     Mental Status: She is alert and oriented to person, place, and time.  Psychiatric:        Mood and Affect: Mood normal.        Behavior: Behavior normal.        Thought Content: Thought content normal.        Judgment: Judgment normal.    BP 130/82 (BP Location: Left Arm)    Pulse 81   Ht 5\' 3"  (1.6 m)   Wt 173 lb (78.5 kg)   BMI 30.65 kg/m   Past Medical History:  Diagnosis Date   Diabetes mellitus without complication (HCC)    Hypertension     Social History   Socioeconomic History   Marital status: Married    Spouse name:   Number of children: 4   Years of education: Not on file   Highest education level: Not on file  Occupational History   Not on file  Tobacco Use   Smoking status: Never   Smokeless tobacco: Never  Substance and Sexual Activity   Alcohol use: Yes    Comment: socially: only at big parties (2 years ago)   Drug use: Not Currently   Sexual activity: Not on file  Other Topics Concern   Not on file  Social History Narrative   Lives with husband and 3 kids at home    Social Determinants of Health   Financial Resource Strain: Not on file  Food Insecurity: Not on file  Transportation Needs: Not on file  Physical Activity: Not on file  Stress: Not on file  Social Connections: Not on file  Intimate Partner Violence: Not on file  Past Surgical History:  Procedure Laterality Date   APPENDECTOMY     CAROTID PTA/STENT INTERVENTION Right 07/26/2020   Procedure: CAROTID PTA/STENT INTERVENTION;  Surgeon: Renford Dills, MD;  Location: [facility] INVASIVE CV LAB;  Service: Cardiovascular;  Laterality: Right;   NO PAST SURGERIES      Family History  Problem Relation Age of Onset   Stroke Father     No Known Allergies  CBC Latest Ref Rng & Units 07/27/2020 06/15/2020 01/27/2014  WBC 4.0 - 10.5 K/uL 8.9 9.8 9.2  Hemoglobin 12.0 - 15.0 g/dL 10.9(L) 11.4(L) 13.3  Hematocrit 36.0 - 46.0 % 33.4(L) 34.5(L) 39.3  Platelets 150 - 400 K/uL 213 296 283      CMP     Component Value Date/Time   NA 136 07/27/2020 0526   NA 137 01/27/2014 0144   K 4.0 07/27/2020 0526   K 4.1 01/27/2014 0144   CL 106 07/27/2020 0526   CL 102 01/27/2014 0144   CO2 26 07/27/2020 0526   CO2 27 01/27/2014 0144   GLUCOSE 117 (H) 07/27/2020  0526   GLUCOSE 401 (H) 01/27/2014 0144   BUN 27 (H) 07/27/2020 0526   BUN 18 01/27/2014 0144   CREATININE 0.82 07/27/2020 0526   CREATININE 0.93 01/27/2014 0144   CALCIUM 8.7 (L) 07/27/2020 0526   CALCIUM 8.4 (L) 01/27/2014 0144   PROT 8.4 (H) 06/15/2020 1448   PROT 8.1 01/27/2014 0144   ALBUMIN 3.7 06/15/2020 1448   ALBUMIN 3.7 01/27/2014 0144   AST 23 06/15/2020 1448   AST 14 (L) 01/27/2014 0144   ALT 31 06/15/2020 1448   ALT 27 01/27/2014 0144   ALKPHOS 119 06/15/2020 1448   ALKPHOS 140 (H) 01/27/2014 0144   BILITOT 0.4 06/15/2020 1448   BILITOT 0.2 01/27/2014 0144   GFRNONAA >60 07/27/2020 0526   GFRNONAA >60 01/27/2014 0144   GFRAA >60 01/27/2014 0144     No results found.     Assessment & Plan:   1. Carotid stenosis, symptomatic, with infarction [facility]) Recommend:  The patient is s/p successful right carotid stenting  Today noninvasive studies show 1 to 39% stenosis of the right ICA with a widely patent stent placement.  The left ICA is a 40 to 59% stenosis however there is minimal plaque noted.  There is an apparent kink in the mid ICA which appears elevated velocities but the ICA itself is widely patent.  Continue antiplatelet therapy as prescribed Continue management of CAD, HTN and Hyperlipidemia Healthy heart diet,  encouraged exercise at least 4 times per week  Follow up in 3 months with duplex ultrasound and physical exam 2. Hypertension associated with diabetes (HCC) Continue antihypertensive medications as already ordered, these medications have been reviewed and there are no changes at this time.   3. Type 2 diabetes mellitus with other circulatory complication, unspecified whether long term insulin use (HCC) Continue hypoglycemic medications as already ordered, these medications have been reviewed and there are no changes at this time.  Hgb A1C to be monitored as already arranged by primary service    Current Outpatient Medications on File Prior to  Visit  Medication Sig Dispense Refill   ACCU-CHEK GUIDE test strip USE TO CHECK BLOOD SUGAR THREE TIMES A DAY     acetaminophen (TYLENOL) 500 MG tablet Take 500 mg by mouth every 6 (six) hours as needed for moderate pain or mild pain.     Artificial Tear Solution (TEARS NATURALE OP) Place 1 drop into both eyes  daily as needed (itchy eye).     aspirin EC 81 MG EC tablet Take 1 tablet (81 mg total) by mouth daily. Swallow whole. 90 tablet 3   clopidogrel (PLAVIX) 75 MG tablet Take 1 tablet (75 mg total) by mouth daily. 90 tablet 3   enalapril (VASOTEC) 2.5 MG tablet Take 2.5 mg by mouth daily.     FARXIGA 10 MG TABS tablet Take 10 mg by mouth daily.     glimepiride (AMARYL) 4 MG tablet Take 4 mg by mouth 2 (two) times daily.     Multiple Vitamin (MULTIVITAMIN) tablet Take 1 tablet by mouth daily.     rosuvastatin (CRESTOR) 40 MG tablet Take 40 mg by mouth daily.     Vitamin D, Ergocalciferol, (DRISDOL) 1.25 MG (50000 UNIT) CAPS capsule Take 50,000 Units by mouth once a week.     No current facility-administered medications on file prior to visit.    There are no Patient Instructions on file for this visit. No follow-ups on file.   Georgiana Spinner, NP

## 2020-08-29 ENCOUNTER — Ambulatory Visit
Admission: RE | Admit: 2020-08-29 | Discharge: 2020-08-29 | Disposition: A | Discharge status: Home / Self Care | Payer: Medicaid Other | Source: Ambulatory Visit | Attending: Urology | Admitting: Urology

## 2020-08-29 ENCOUNTER — Other Ambulatory Visit: Payer: Self-pay

## 2020-08-29 DIAGNOSIS — R3129 Other microscopic hematuria: Secondary | ICD-10-CM | POA: Diagnosis not present

## 2020-08-29 LAB — POCT I-STAT CREATININE: Creatinine, Ser: 0.8 mg/dL (ref 0.44–1.00)

## 2020-08-29 IMAGING — CT CT ABD-PEL WO/W CM
3 of 10 series · 11 of 46 positions shown, 17 images · IV contrast (APPLIED)
Comparison: None.

CLINICAL DATA: Gross hematuria after starting anticoagulation

EXAM:
CT ABDOMEN AND PELVIS WITHOUT AND WITH CONTRAST
TECHNIQUE: Multidetector CT imaging of the abdomen and pelvis was performed
following the standard protocol before and following the bolus
administration of intravenous contrast.
CONTRAST:  100mL OMNIPAQUE IOHEXOL 350 MG/ML SOLN

[Series 2: axial pre · axial · non-contrast · 0.91mm/px · z∈[-463,-393]mm · 2 of 98 slices shown]
[im 14/98  soft-tissue]
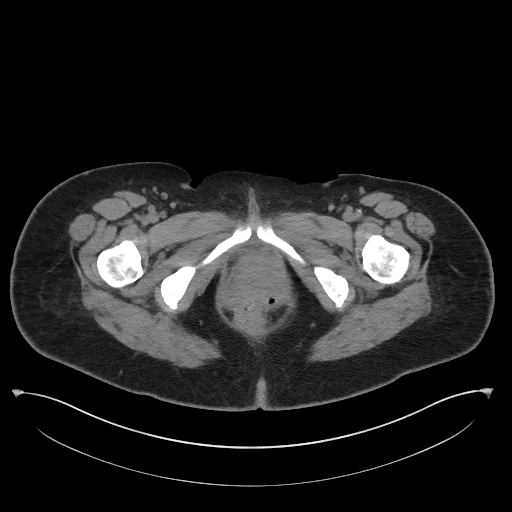
[im 28/98  soft-tissue]
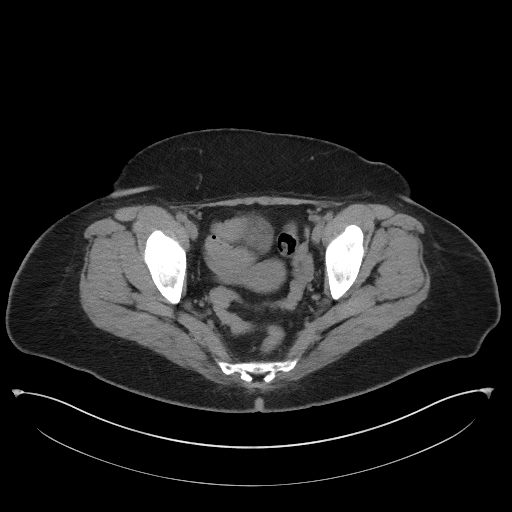

[Series 6: coronal pre · coronal · non-contrast · 0.75mm/px · 2 of 100 slices shown, 3 images]
[im 34/100  soft-tissue]
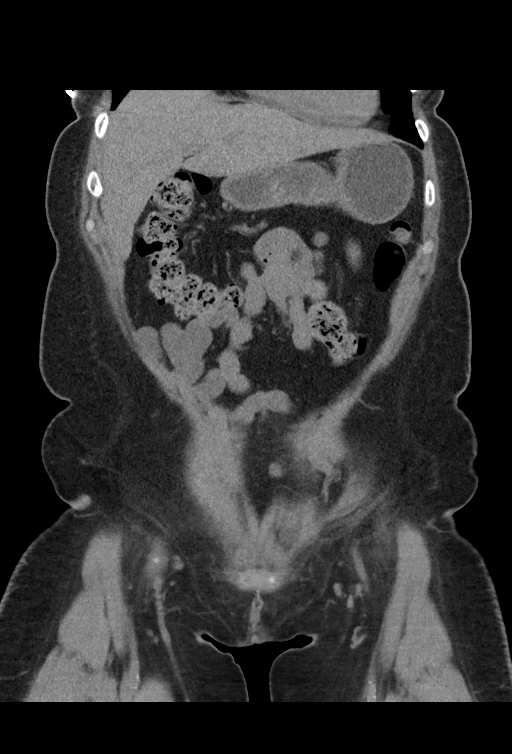
[im 34/100  bone]
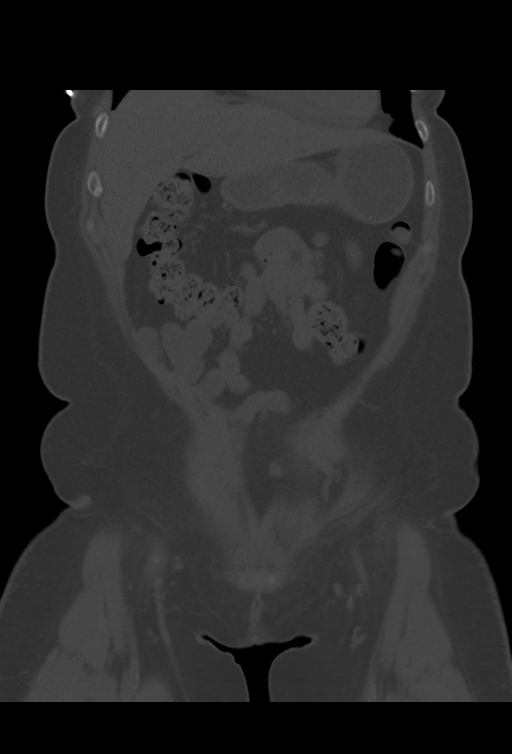
[im 67/100  soft-tissue]
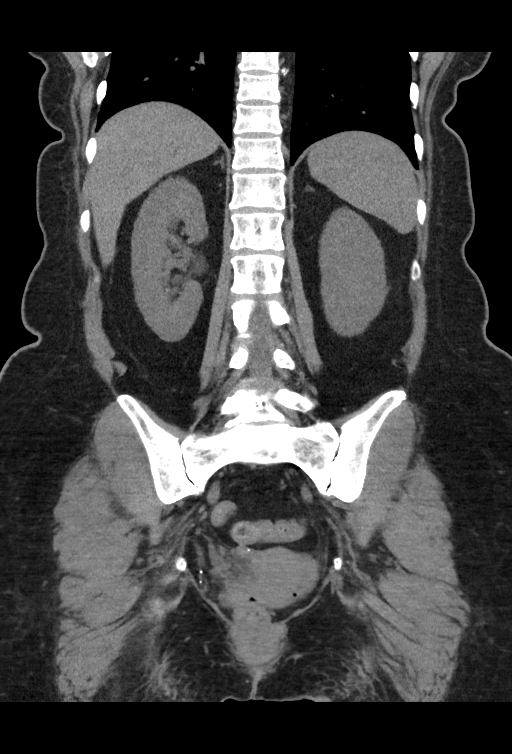

[Series 13: axial delay · axial · delayed · 0.90mm/px · z∈[-474,-84]mm · 7 of 104 slices shown, 12 images]
[im 13/104  soft-tissue]
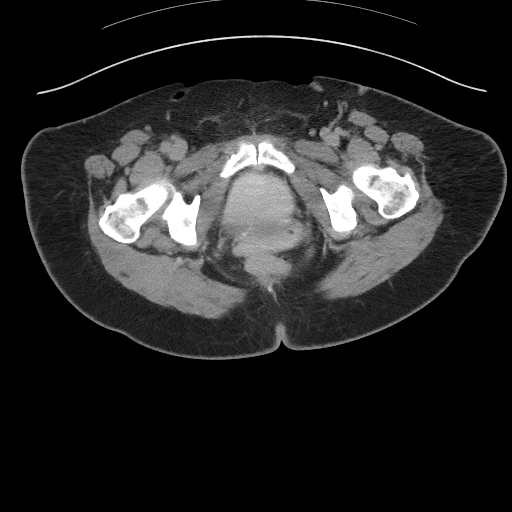
[im 13/104  bone]
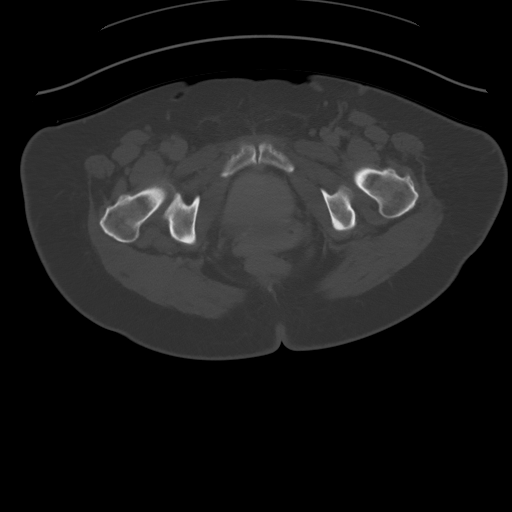
[im 26/104  soft-tissue]
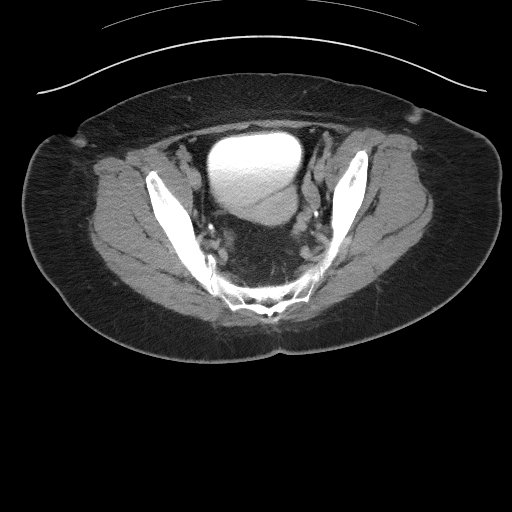
[im 39/104  soft-tissue]
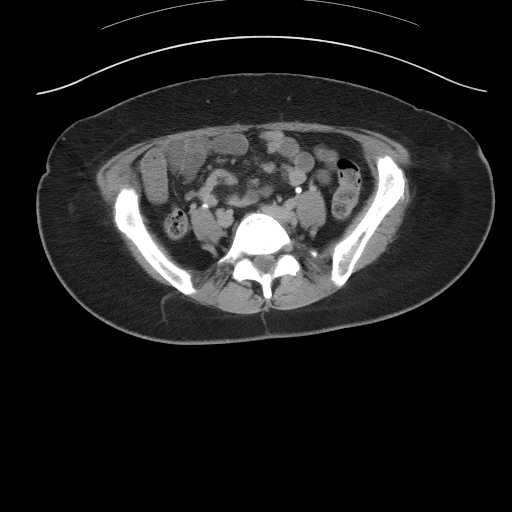
[im 52/104  soft-tissue]
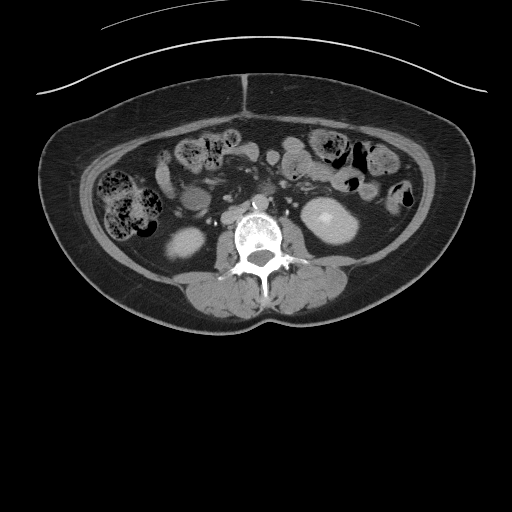
[im 52/104  lung]
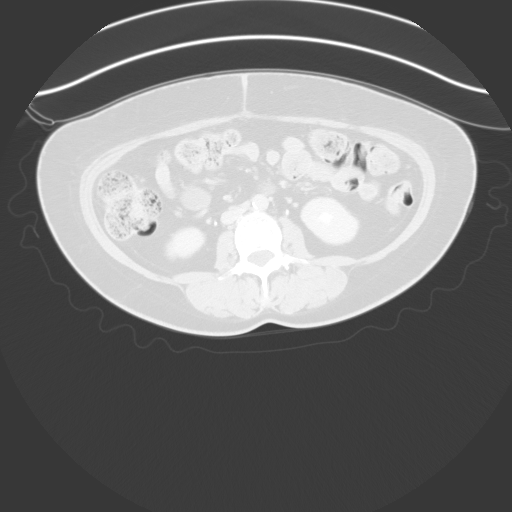
[im 65/104  soft-tissue]
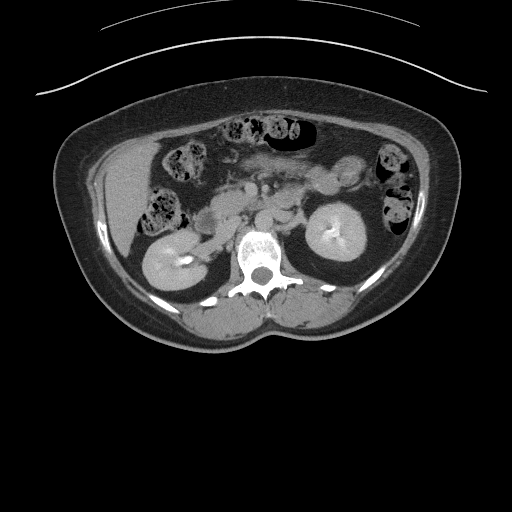
[im 65/104  lung]
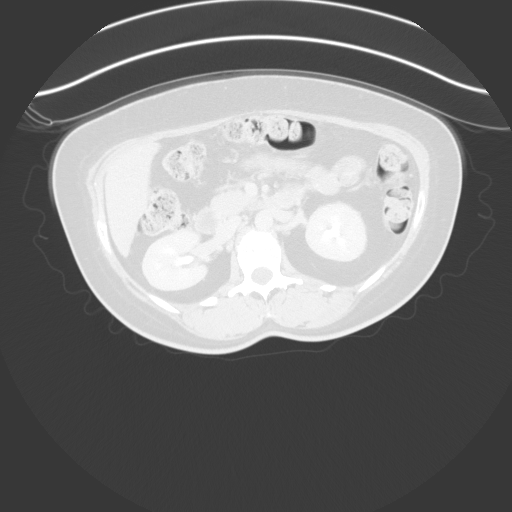
[im 78/104  soft-tissue]
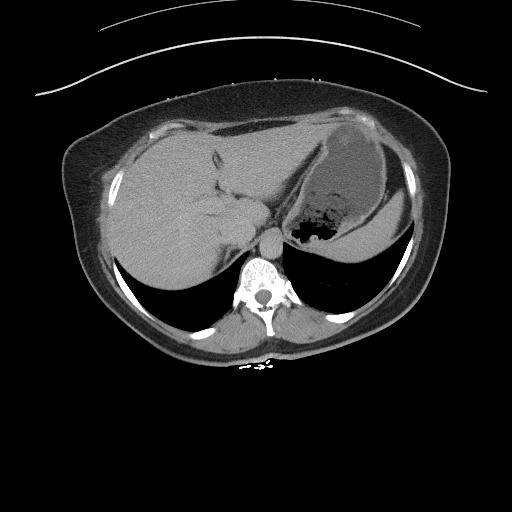
[im 78/104  lung]
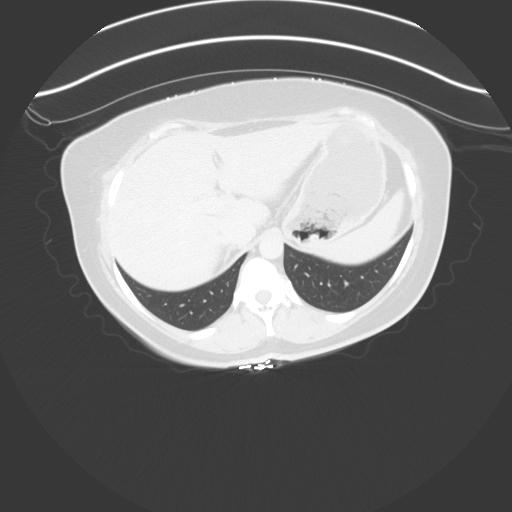
[im 91/104  soft-tissue]
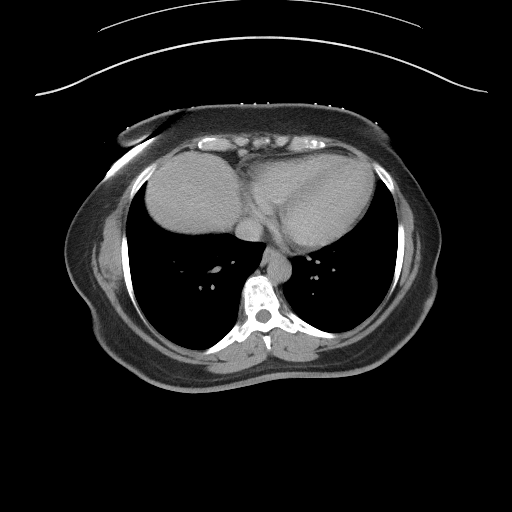
[im 91/104  lung]
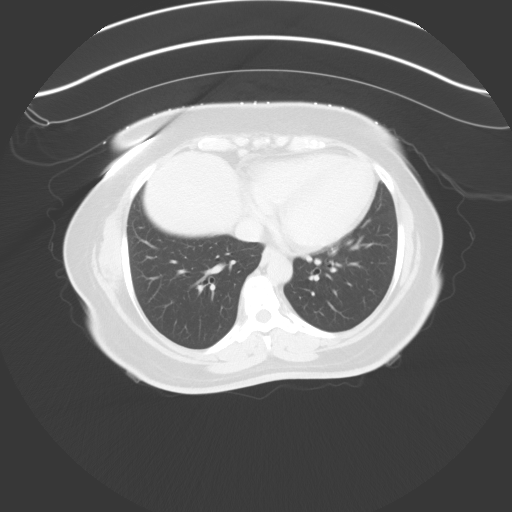

[11 of 46 positions shown; findings below may reference images not displayed]

FINDINGS: Lower chest: No acute abnormality.

Hepatobiliary: Hepatomegaly measuring 20.2 cm in maximum
craniocaudal dimension. Hepatic steatosis. No suspicious hepatic
lesion. Gallbladder is unremarkable. No biliary ductal dilation.

Pancreas: Within normal limits.

Spleen: Within normal limits

Adrenals/Urinary Tract: Small fat density lesion in the left adrenal
gland consistent with a benign adrenal myelolipoma. Right adrenal
glands unremarkable.

No hydronephrosis. No renal, ureteral or bladder calculi visualized.
No solid enhancing renal masses. There is symmetric enhancement
excretion of contrast in bilateral kidneys. No suspicious filling
defect visualized within the opacified portions of the collecting
systems or ureters on delayed imaging.

Urinary bladder is unremarkable without focal wall thickening or
intraluminal filling defect visualized.

Stomach/Bowel: Stomach is unremarkable. No pathologic dilation of
small bowel. The appendix and terminal ileum appear normal. Small
volume of formed stool throughout the colon. No suspicious colonic
wall thickening or mass like lesions visualized.

Vascular/Lymphatic: Aortic atherosclerosis without aneurysmal
dilation. No pathologically enlarged abdominal or pelvic lymph
nodes.

Reproductive: Uterus and bilateral adnexa are unremarkable.

Other: No abdominopelvic ascites.

Musculoskeletal: L5-S1 discogenic disease. No acute osseous
abnormality. Age
IMPRESSION: 1. No hydronephrosis. No renal, ureteral, or bladder calculi. No
solid enhancing renal masses.
2. Hepatomegaly with hepatic steatosis.
3. Small benign left adrenal myelolipoma.
4.  Aortic Atherosclerosis ([CV]-[CV]).

## 2020-08-29 MED ORDER — IOHEXOL 350 MG/ML SOLN
100.0000 mL | Freq: Once | INTRAVENOUS | Status: AC | PRN
  Administered 2020-08-29: 100 mL via INTRAVENOUS

## 2020-08-30 ENCOUNTER — Ambulatory Visit
Admission: RE | Admit: 2020-08-30 | Discharge: 2020-08-30 | Disposition: A | Discharge status: Home / Self Care | Payer: Medicaid Other | Source: Ambulatory Visit | Attending: Internal Medicine | Admitting: Internal Medicine

## 2020-08-30 DIAGNOSIS — Z1231 Encounter for screening mammogram for malignant neoplasm of breast: Secondary | ICD-10-CM | POA: Diagnosis not present

## 2020-08-30 IMAGING — MG MM DIGITAL SCREENING BILAT W/ TOMO AND CAD
6 of 10 series · 6 of 30 positions shown · non-contrast
Comparison: Previous exam(s).

CLINICAL DATA: Screening.

EXAM:
DIGITAL SCREENING BILATERAL MAMMOGRAM WITH TOMOSYNTHESIS AND CAD
TECHNIQUE: Bilateral screening digital craniocaudal and mediolateral oblique
mammograms were obtained. Bilateral screening digital breast
tomosynthesis was performed. The images were evaluated with
computer-aided detection.

[L MLO synth-2D (1 of 2)]
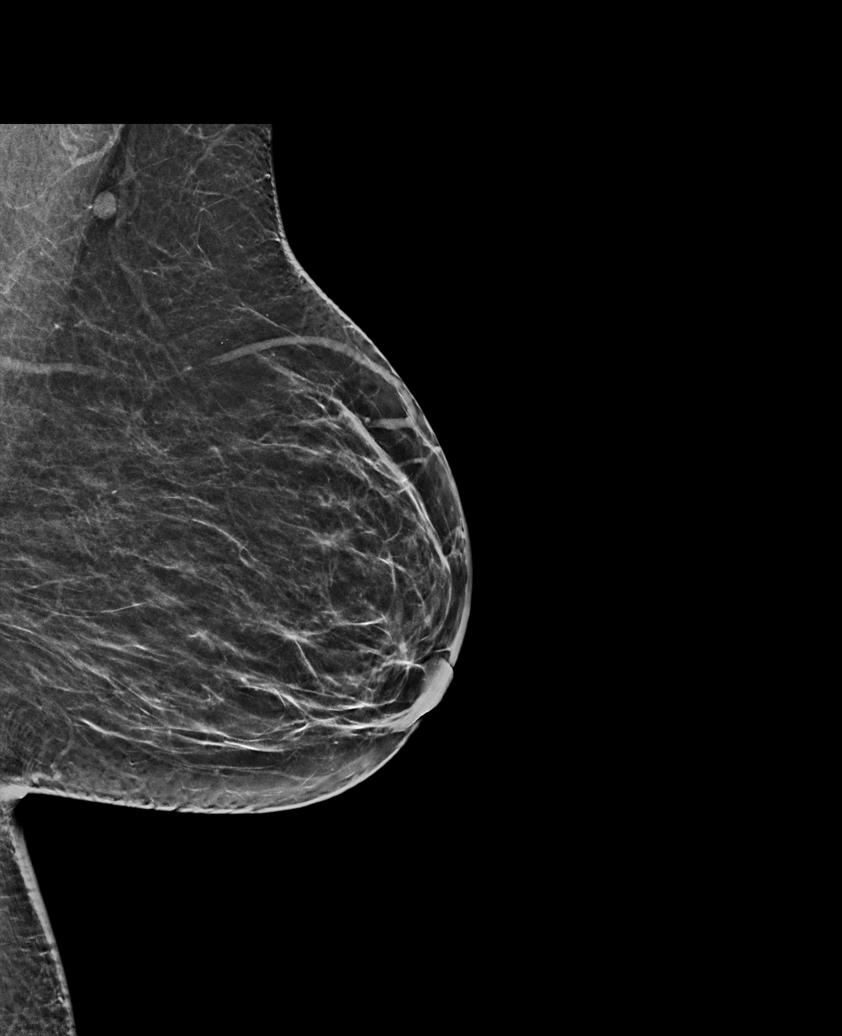

[L MLO synth-2D (2 of 2)]
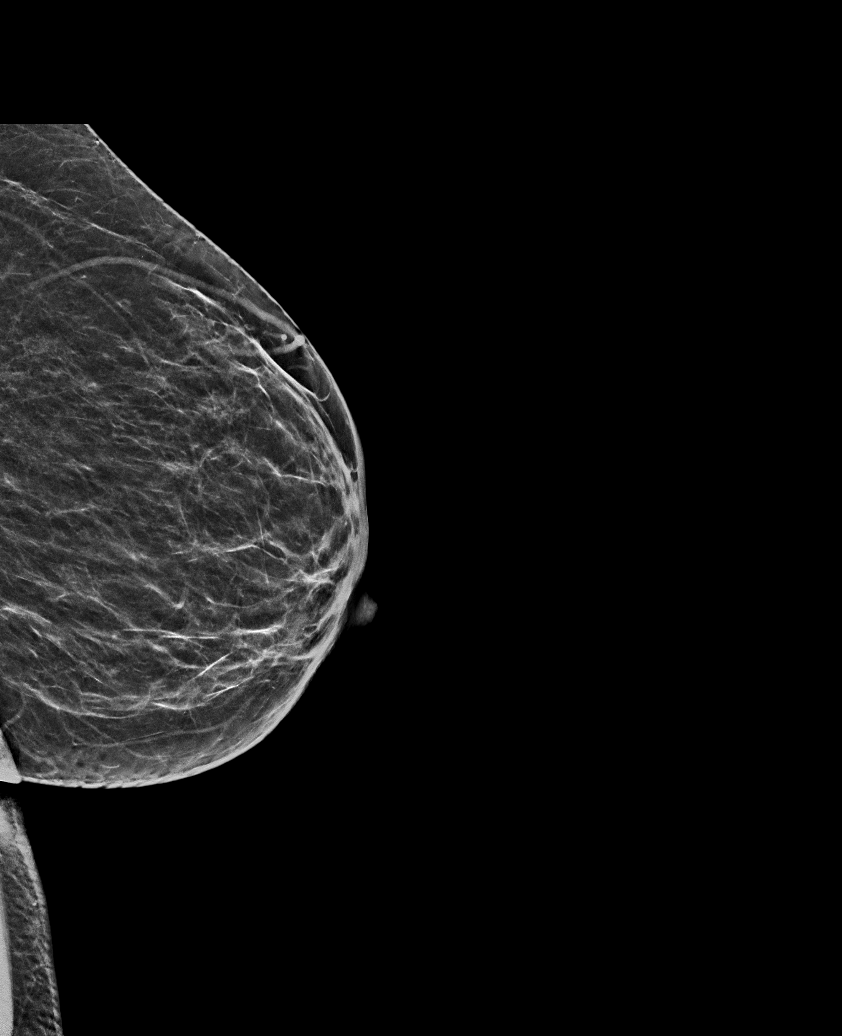

[R MLO synth-2D]
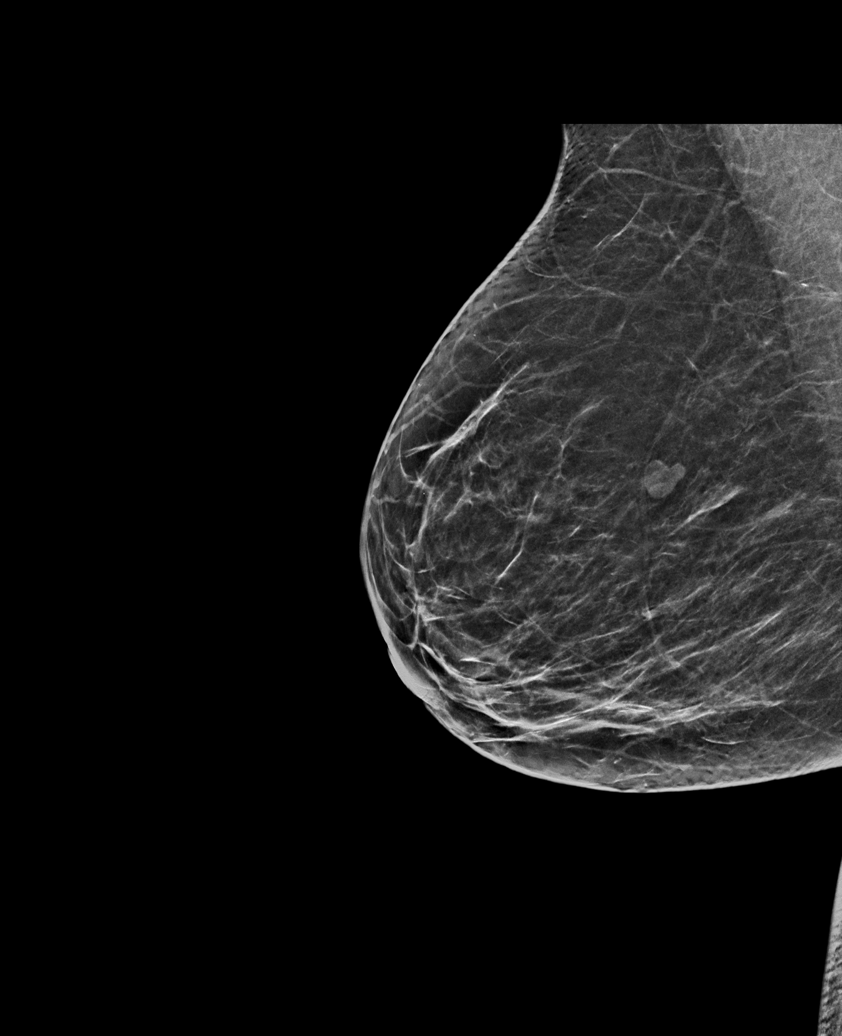

[R CC synth-2D]
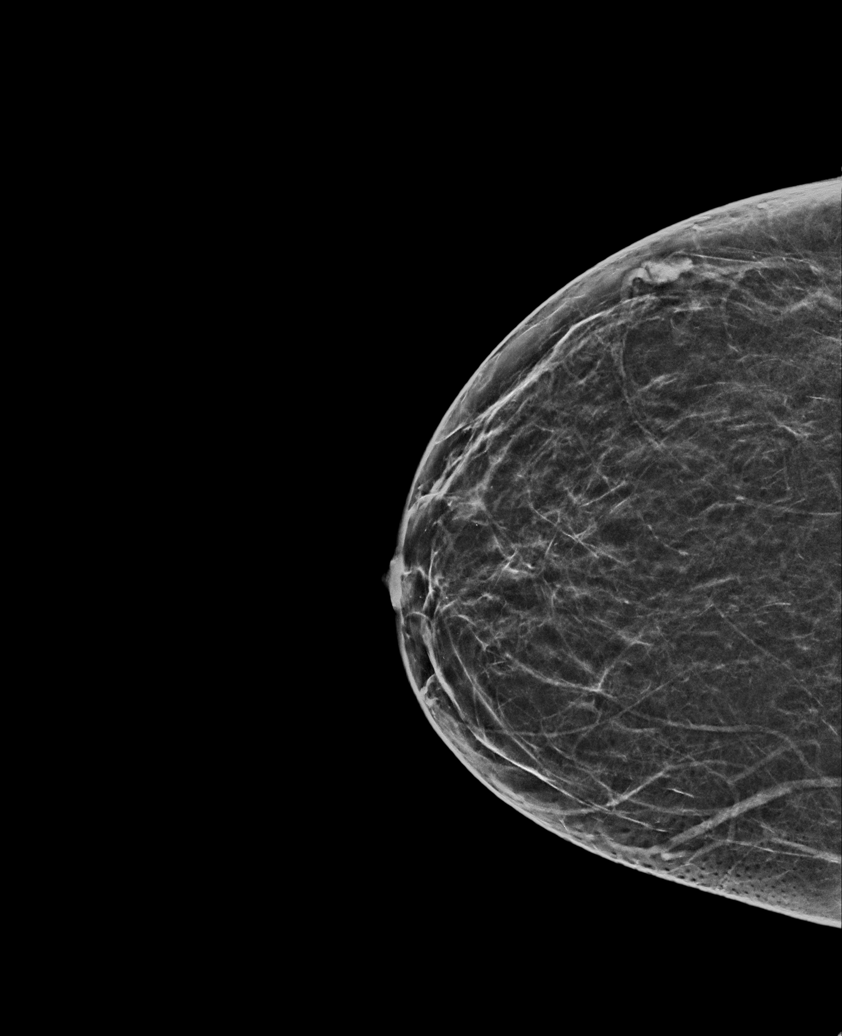

[L CC synth-2D]
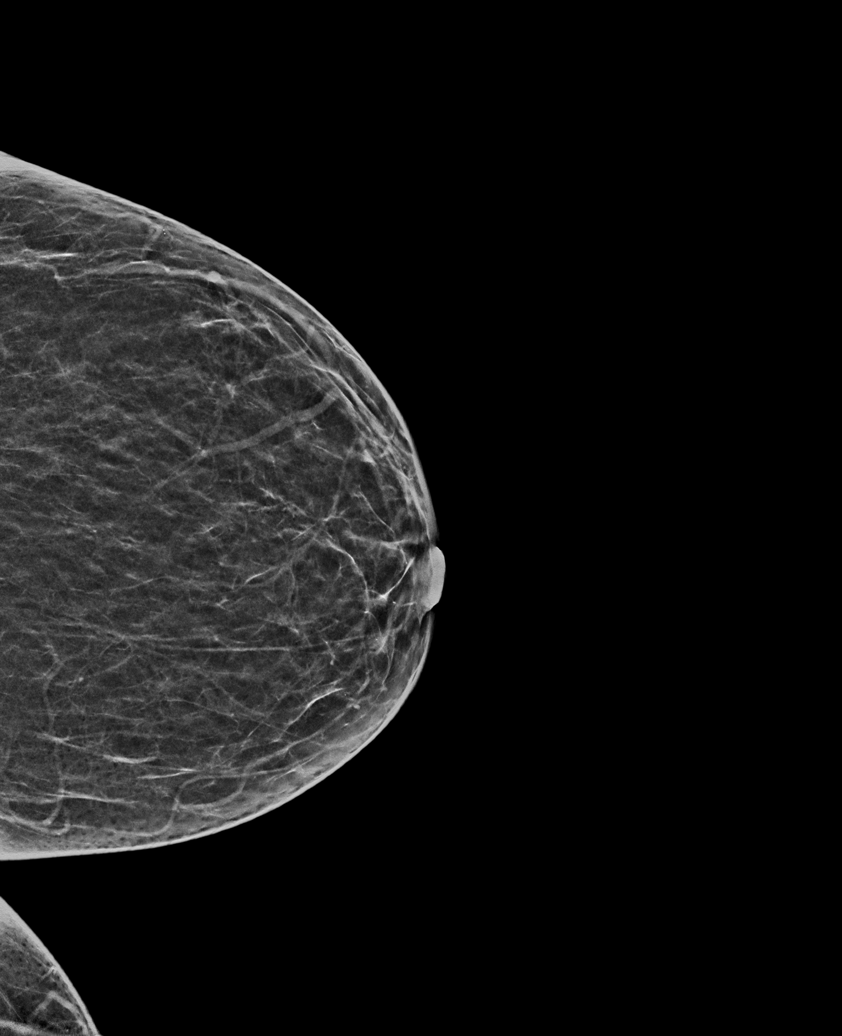

[R CC tomo · tomo slice 28/55.0]
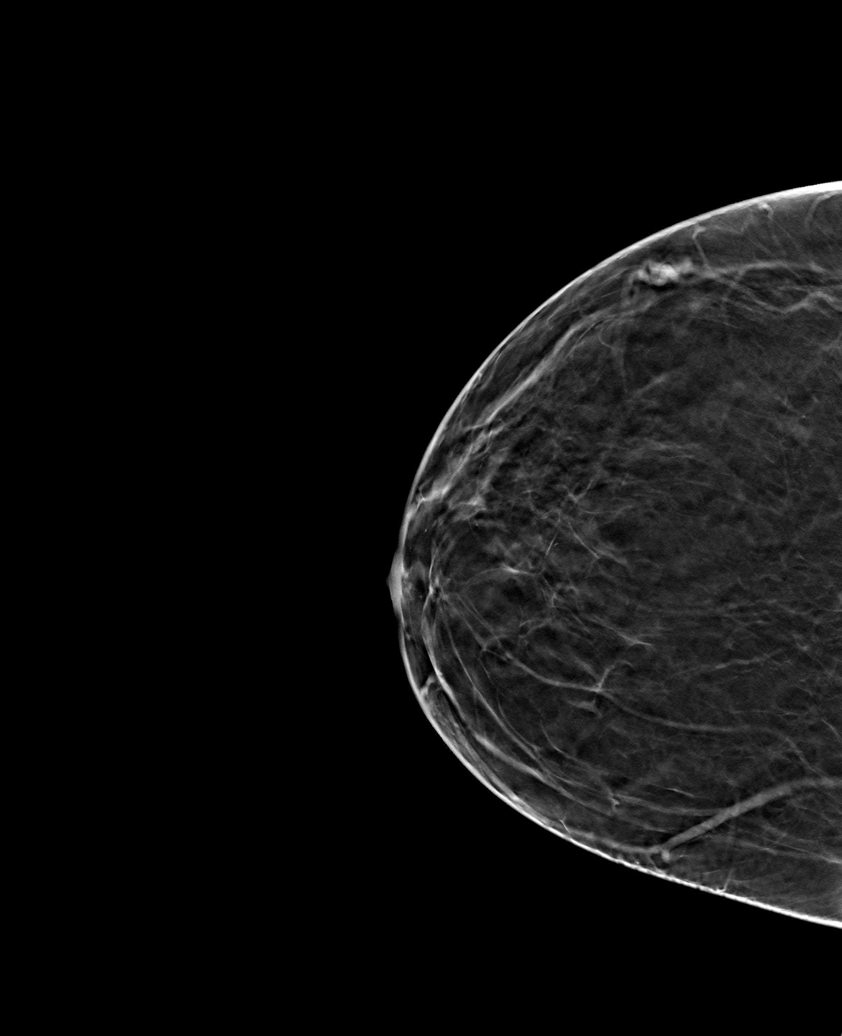

[6 of 30 positions shown; findings below may reference images not displayed]

ACR Breast Density Category b: There are scattered areas of
fibroglandular density.
FINDINGS: There are no findings suspicious for malignancy.
IMPRESSION: No mammographic evidence of malignancy. A result letter of this
screening mammogram will be mailed directly to the patient.

RECOMMENDATION:
Screening mammogram in one year. (Code:[BY])

BI-RADS CATEGORY  1: Negative.

## 2020-08-31 ENCOUNTER — Ambulatory Visit (INDEPENDENT_AMBULATORY_CARE_PROVIDER_SITE_OTHER): Payer: Medicaid Other | Admitting: Urology

## 2020-08-31 ENCOUNTER — Encounter: Payer: Self-pay | Admitting: Urology

## 2020-08-31 ENCOUNTER — Other Ambulatory Visit: Payer: Self-pay

## 2020-08-31 VITALS — BP 127/78 | HR 83 | Ht 63.0 in | Wt 173.0 lb

## 2020-08-31 DIAGNOSIS — R310 Gross hematuria: Secondary | ICD-10-CM

## 2020-08-31 NOTE — Patient Instructions (Signed)
Will call with Cytology results 

## 2020-08-31 NOTE — Progress Notes (Signed)
Cystoscopy Procedure Note:  Indication: Gross hematuria  After informed consent and discussion of the procedure and its risks, Sharon Gray was positioned and prepped in the standard fashion. Cystoscopy was performed with a flexible cystoscope. The urethra, bladder neck and entire bladder was visualized in a standard fashion.The ureteral orifices were visualized in their normal location and orientation.  Mild erythema on the left side of the bladder, but no definite tumors or lesions.  No abnormalities on retroflexion.  Directed cytology sent.  Imaging: No abnormalities on CT urogram   Findings: Mild bladder erythema, normal CT  Assessment and Plan: Will call with urine cytology, if suspicious or positive pursue bladder biopsy.  If benign can follow-up as needed  Legrand Rams, MD 08/31/2020

## 2020-09-02 LAB — CYTOLOGY - NON PAP

## 2020-09-05 ENCOUNTER — Telehealth: Payer: Self-pay

## 2020-09-05 NOTE — Telephone Encounter (Signed)
Called pt, no answer. Left detailed message per DPR. Advised pt to call back for questions or concerns.  

## 2020-09-05 NOTE — Telephone Encounter (Signed)
-----   Message from Sondra Come, MD sent at 09/05/2020 12:55 PM EDT ----- Good news, no cancer cells in the urine.  Can follow-up with urology as needed

## 2020-11-17 ENCOUNTER — Other Ambulatory Visit (INDEPENDENT_AMBULATORY_CARE_PROVIDER_SITE_OTHER): Payer: Self-pay | Admitting: Vascular Surgery

## 2020-11-17 DIAGNOSIS — I6521 Occlusion and stenosis of right carotid artery: Secondary | ICD-10-CM

## 2020-11-18 ENCOUNTER — Ambulatory Visit (INDEPENDENT_AMBULATORY_CARE_PROVIDER_SITE_OTHER): Payer: Medicaid Other | Admitting: Nurse Practitioner

## 2020-11-18 ENCOUNTER — Ambulatory Visit (INDEPENDENT_AMBULATORY_CARE_PROVIDER_SITE_OTHER): Payer: Medicaid Other

## 2020-11-18 ENCOUNTER — Other Ambulatory Visit: Payer: Self-pay

## 2020-11-18 VITALS — BP 118/75 | HR 75 | Ht 63.0 in | Wt 172.0 lb

## 2020-11-18 DIAGNOSIS — I6521 Occlusion and stenosis of right carotid artery: Secondary | ICD-10-CM

## 2020-11-18 DIAGNOSIS — I152 Hypertension secondary to endocrine disorders: Secondary | ICD-10-CM

## 2020-11-18 DIAGNOSIS — E1159 Type 2 diabetes mellitus with other circulatory complications: Secondary | ICD-10-CM | POA: Diagnosis not present

## 2020-11-19 NOTE — Progress Notes (Signed)
Subjective:    Patient ID: Sharon Gray, female    DOB: 1965/04/28, 55 y.o.   MRN: [id number] Chief Complaint  Patient presents with   Follow-up    3 mo carotid    Sharon Gray is a 55 year old female that is seen for follow up evaluation of carotid stenosis status post right  carotid stent on 07/26/2020.  There were no post operative problems or complications related to the surgery.  The patient denies neck or incisional pain.   The patient denies interval amaurosis fugax. There is no recent history of TIA symptoms or focal motor deficits. There is no prior documented CVA.   The patient denies headache.   The patient is taking enteric-coated aspirin 81 mg daily.   The patient has a history of coronary artery disease, no recent episodes of angina or shortness of breath. The patient denies PAD or claudication symptoms. There is a history of hyperlipidemia which is being treated with a statin.     Today noninvasive studies shows no evidence of stenosis in the right ICA.  The carotid stent is widely patent.  The left ICA has a 1 to 39% stenosis.  Velocities in the left ICA are less than previous studies done on 08/18/2020.   Review of Systems  All other systems reviewed and are negative.     Objective:   Physical Exam Vitals reviewed.  HENT:     Head: Normocephalic.  Cardiovascular:     Rate and Rhythm: Normal rate and regular rhythm.     Pulses: Normal pulses.  Pulmonary:     Effort: Pulmonary effort is normal.  Skin:    General: Skin is warm and dry.  Neurological:     Mental Status: She is alert and oriented to person, place, and time.  Psychiatric:        Mood and Affect: Mood normal.        Behavior: Behavior normal.        Thought Content: Thought content normal.        Judgment: Judgment normal.    BP 118/75   Pulse 75   Ht 5\' 3"  (1.6 m)   Wt 172 lb (78 kg)   BMI 30.47 kg/m   Past Medical History:  Diagnosis Date   Diabetes mellitus without complication  (HCC)    Hypertension     Social History   Socioeconomic History   Marital status: Married    Spouse name:   Number of children: 4   Years of education: Not on file   Highest education level: Not on file  Occupational History   Not on file  Tobacco Use   Smoking status: Never   Smokeless tobacco: Never  Substance and Sexual Activity   Alcohol use: Yes    Comment: socially: only at big parties (2 years ago)   Drug use: Not Currently   Sexual activity: Not on file  Other Topics Concern   Not on file  Social History Narrative   Lives with husband and 3 kids at home    Social Determinants of Health   Financial Resource Strain: Not on file  Food Insecurity: Not on file  Transportation Needs: Not on file  Physical Activity: Not on file  Stress: Not on file  Social Connections: Not on file  Intimate Partner Violence: Not on file    Past Surgical History:  Procedure Laterality Date   APPENDECTOMY     CAROTID PTA/STENT INTERVENTION Right 07/26/2020   Procedure: CAROTID  PTA/STENT INTERVENTION;  Surgeon: Renford Dills, MD;  Location: [facility] INVASIVE CV LAB;  Service: Cardiovascular;  Laterality: Right;   NO PAST SURGERIES      Family History  Problem Relation Age of Onset   Stroke Father     No Known Allergies  CBC Latest Ref Rng & Units 07/27/2020 06/15/2020 01/27/2014  WBC 4.0 - 10.5 K/uL 8.9 9.8 9.2  Hemoglobin 12.0 - 15.0 g/dL 10.9(L) 11.4(L) 13.3  Hematocrit 36.0 - 46.0 % 33.4(L) 34.5(L) 39.3  Platelets 150 - 400 K/uL 213 296 283      CMP     Component Value Date/Time   NA 136 07/27/2020 0526   NA 137 01/27/2014 0144   K 4.0 07/27/2020 0526   K 4.1 01/27/2014 0144   CL 106 07/27/2020 0526   CL 102 01/27/2014 0144   CO2 26 07/27/2020 0526   CO2 27 01/27/2014 0144   GLUCOSE 117 (H) 07/27/2020 0526   GLUCOSE 401 (H) 01/27/2014 0144   BUN 27 (H) 07/27/2020 0526   BUN 18 01/27/2014 0144   CREATININE 0.80 08/29/2020 1322   CREATININE 0.93  01/27/2014 0144   CALCIUM 8.7 (L) 07/27/2020 0526   CALCIUM 8.4 (L) 01/27/2014 0144   PROT 8.4 (H) 06/15/2020 1448   PROT 8.1 01/27/2014 0144   ALBUMIN 3.7 06/15/2020 1448   ALBUMIN 3.7 01/27/2014 0144   AST 23 06/15/2020 1448   AST 14 (L) 01/27/2014 0144   ALT 31 06/15/2020 1448   ALT 27 01/27/2014 0144   ALKPHOS 119 06/15/2020 1448   ALKPHOS 140 (H) 01/27/2014 0144   BILITOT 0.4 06/15/2020 1448   BILITOT 0.2 01/27/2014 0144   GFRNONAA >60 07/27/2020 0526   GFRNONAA >60 01/27/2014 0144   GFRAA >60 01/27/2014 0144     No results found.     Assessment & Plan:   1. Carotid artery stenosis, symptomatic, right Recommend:  Given the patient's asymptomatic subcritical stenosis no further invasive testing or surgery at this time.  Duplex ultrasound shows minimal stenosis of the right ICA with 1 to 39% stenosis of the left.  The right stent is open and patent  Continue antiplatelet therapy as prescribed Continue management of CAD, HTN and Hyperlipidemia Healthy heart diet,  encouraged exercise at least 4 times per week Follow up in 6 months with duplex ultrasound and physical exam    2. Hypertension associated with diabetes (HCC) Continue antihypertensive medications as already ordered, these medications have been reviewed and there are no changes at this time.   3. Type 2 diabetes mellitus with other circulatory complication, unspecified whether long term insulin use (HCC) Continue hypoglycemic medications as already ordered, these medications have been reviewed and there are no changes at this time.  Hgb A1C to be monitored as already arranged by primary service    Current Outpatient Medications on File Prior to Visit  Medication Sig Dispense Refill   ACCU-CHEK GUIDE test strip USE TO CHECK BLOOD SUGAR THREE TIMES A DAY     acetaminophen (TYLENOL) 500 MG tablet Take 500 mg by mouth every 6 (six) hours as needed for moderate pain or mild pain.     Artificial Tear Solution  (TEARS NATURALE OP) Place 1 drop into both eyes daily as needed (itchy eye).     aspirin EC 81 MG EC tablet Take 1 tablet (81 mg total) by mouth daily. Swallow whole. 90 tablet 3   enalapril (VASOTEC) 2.5 MG tablet Take 2.5 mg by mouth daily.  FARXIGA 10 MG TABS tablet Take 10 mg by mouth daily.     glimepiride (AMARYL) 4 MG tablet Take 4 mg by mouth 2 (two) times daily.     Multiple Vitamin (MULTIVITAMIN) tablet Take 1 tablet by mouth daily.     rosuvastatin (CRESTOR) 40 MG tablet Take 40 mg by mouth daily.     Vitamin D, Ergocalciferol, (DRISDOL) 1.25 MG (50000 UNIT) CAPS capsule Take 50,000 Units by mouth once a week.     clopidogrel (PLAVIX) 75 MG tablet Take 1 tablet (75 mg total) by mouth daily. 90 tablet 3   No current facility-administered medications on file prior to visit.    There are no Patient Instructions on file for this visit. No follow-ups on file.   Georgiana Spinner, NP

## 2020-11-20 ENCOUNTER — Encounter (INDEPENDENT_AMBULATORY_CARE_PROVIDER_SITE_OTHER): Payer: Self-pay | Admitting: Nurse Practitioner

## 2021-05-17 ENCOUNTER — Other Ambulatory Visit: Payer: Self-pay

## 2021-05-17 ENCOUNTER — Encounter: Payer: Self-pay | Admitting: Emergency Medicine

## 2021-05-17 ENCOUNTER — Emergency Department: Payer: Medicaid Other

## 2021-05-17 DIAGNOSIS — Z823 Family history of stroke: Secondary | ICD-10-CM

## 2021-05-17 DIAGNOSIS — Z7982 Long term (current) use of aspirin: Secondary | ICD-10-CM

## 2021-05-17 DIAGNOSIS — B9620 Unspecified Escherichia coli [E. coli] as the cause of diseases classified elsewhere: Secondary | ICD-10-CM | POA: Diagnosis present

## 2021-05-17 DIAGNOSIS — L97919 Non-pressure chronic ulcer of unspecified part of right lower leg with unspecified severity: Secondary | ICD-10-CM | POA: Diagnosis present

## 2021-05-17 DIAGNOSIS — Z791 Long term (current) use of non-steroidal anti-inflammatories (NSAID): Secondary | ICD-10-CM

## 2021-05-17 DIAGNOSIS — B961 Klebsiella pneumoniae [K. pneumoniae] as the cause of diseases classified elsewhere: Secondary | ICD-10-CM | POA: Diagnosis present

## 2021-05-17 DIAGNOSIS — L03115 Cellulitis of right lower limb: Secondary | ICD-10-CM | POA: Diagnosis present

## 2021-05-17 DIAGNOSIS — Z8249 Family history of ischemic heart disease and other diseases of the circulatory system: Secondary | ICD-10-CM

## 2021-05-17 DIAGNOSIS — E1140 Type 2 diabetes mellitus with diabetic neuropathy, unspecified: Secondary | ICD-10-CM | POA: Diagnosis present

## 2021-05-17 DIAGNOSIS — Z79899 Other long term (current) drug therapy: Secondary | ICD-10-CM

## 2021-05-17 DIAGNOSIS — B9562 Methicillin resistant Staphylococcus aureus infection as the cause of diseases classified elsewhere: Secondary | ICD-10-CM | POA: Diagnosis present

## 2021-05-17 DIAGNOSIS — N179 Acute kidney failure, unspecified: Secondary | ICD-10-CM | POA: Diagnosis present

## 2021-05-17 DIAGNOSIS — Z7902 Long term (current) use of antithrombotics/antiplatelets: Secondary | ICD-10-CM

## 2021-05-17 DIAGNOSIS — E1169 Type 2 diabetes mellitus with other specified complication: Principal | ICD-10-CM | POA: Diagnosis present

## 2021-05-17 DIAGNOSIS — E11628 Type 2 diabetes mellitus with other skin complications: Secondary | ICD-10-CM | POA: Diagnosis present

## 2021-05-17 DIAGNOSIS — Z8673 Personal history of transient ischemic attack (TIA), and cerebral infarction without residual deficits: Secondary | ICD-10-CM

## 2021-05-17 DIAGNOSIS — E11621 Type 2 diabetes mellitus with foot ulcer: Secondary | ICD-10-CM | POA: Diagnosis present

## 2021-05-17 DIAGNOSIS — M868X7 Other osteomyelitis, ankle and foot: Secondary | ICD-10-CM | POA: Diagnosis present

## 2021-05-17 DIAGNOSIS — Z7984 Long term (current) use of oral hypoglycemic drugs: Secondary | ICD-10-CM

## 2021-05-17 DIAGNOSIS — Z23 Encounter for immunization: Secondary | ICD-10-CM

## 2021-05-17 DIAGNOSIS — I10 Essential (primary) hypertension: Secondary | ICD-10-CM | POA: Diagnosis present

## 2021-05-17 LAB — CBC
HCT: 33.2 % — ABNORMAL LOW (ref 36.0–46.0)
Hemoglobin: 10.5 g/dL — ABNORMAL LOW (ref 12.0–15.0)
MCH: 27.8 pg (ref 26.0–34.0)
MCHC: 31.6 g/dL (ref 30.0–36.0)
MCV: 87.8 fL (ref 80.0–100.0)
Platelets: 268 10*3/uL (ref 150–400)
RBC: 3.78 MIL/uL — ABNORMAL LOW (ref 3.87–5.11)
RDW: 12.9 % (ref 11.5–15.5)
WBC: 10.1 10*3/uL (ref 4.0–10.5)
nRBC: 0 % (ref 0.0–0.2)

## 2021-05-17 LAB — BASIC METABOLIC PANEL
Anion gap: 10 (ref 5–15)
BUN: 39 mg/dL — ABNORMAL HIGH (ref 6–20)
CO2: 24 mmol/L (ref 22–32)
Calcium: 8.6 mg/dL — ABNORMAL LOW (ref 8.9–10.3)
Chloride: 102 mmol/L (ref 98–111)
Creatinine, Ser: 1.36 mg/dL — ABNORMAL HIGH (ref 0.44–1.00)
GFR, Estimated: 46 mL/min — ABNORMAL LOW (ref >60)
Glucose, Bld: 171 mg/dL — ABNORMAL HIGH (ref 70–99)
Potassium: 4.1 mmol/L (ref 3.5–5.1)
Sodium: 136 mmol/L (ref 135–145)

## 2021-05-17 IMAGING — DX DG FOOT COMPLETE 3+V*R*
3 series · 3 of 3 positions shown · non-contrast
Comparison: None.

CLINICAL DATA: Right foot pain and swelling

EXAM:
RIGHT FOOT COMPLETE - 3+ VIEW

[foot ap]
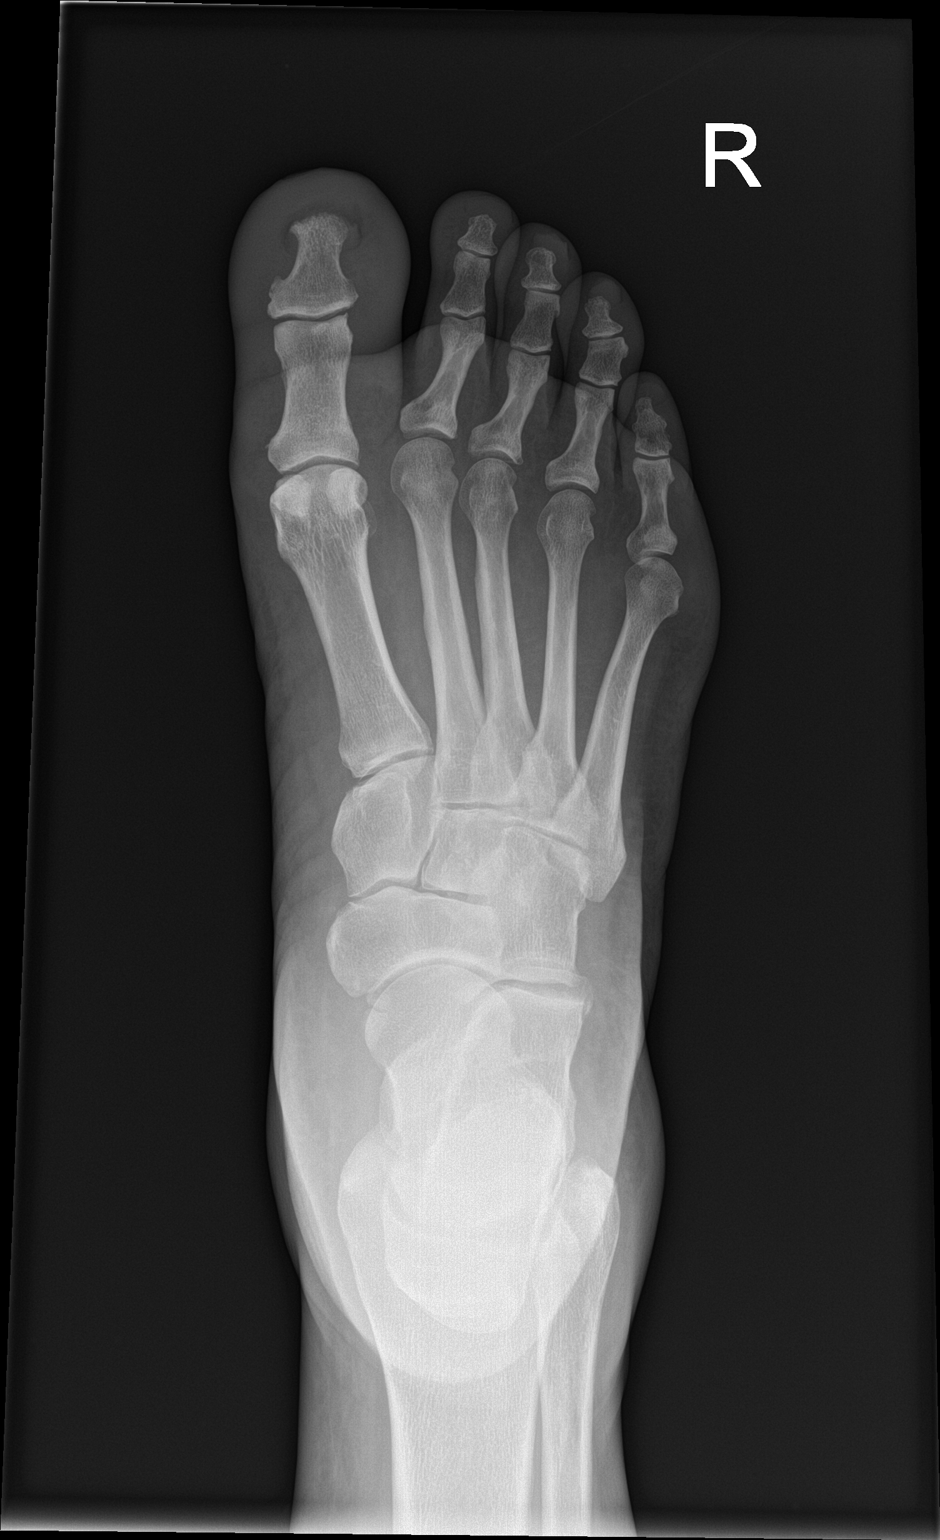

[foot obl]
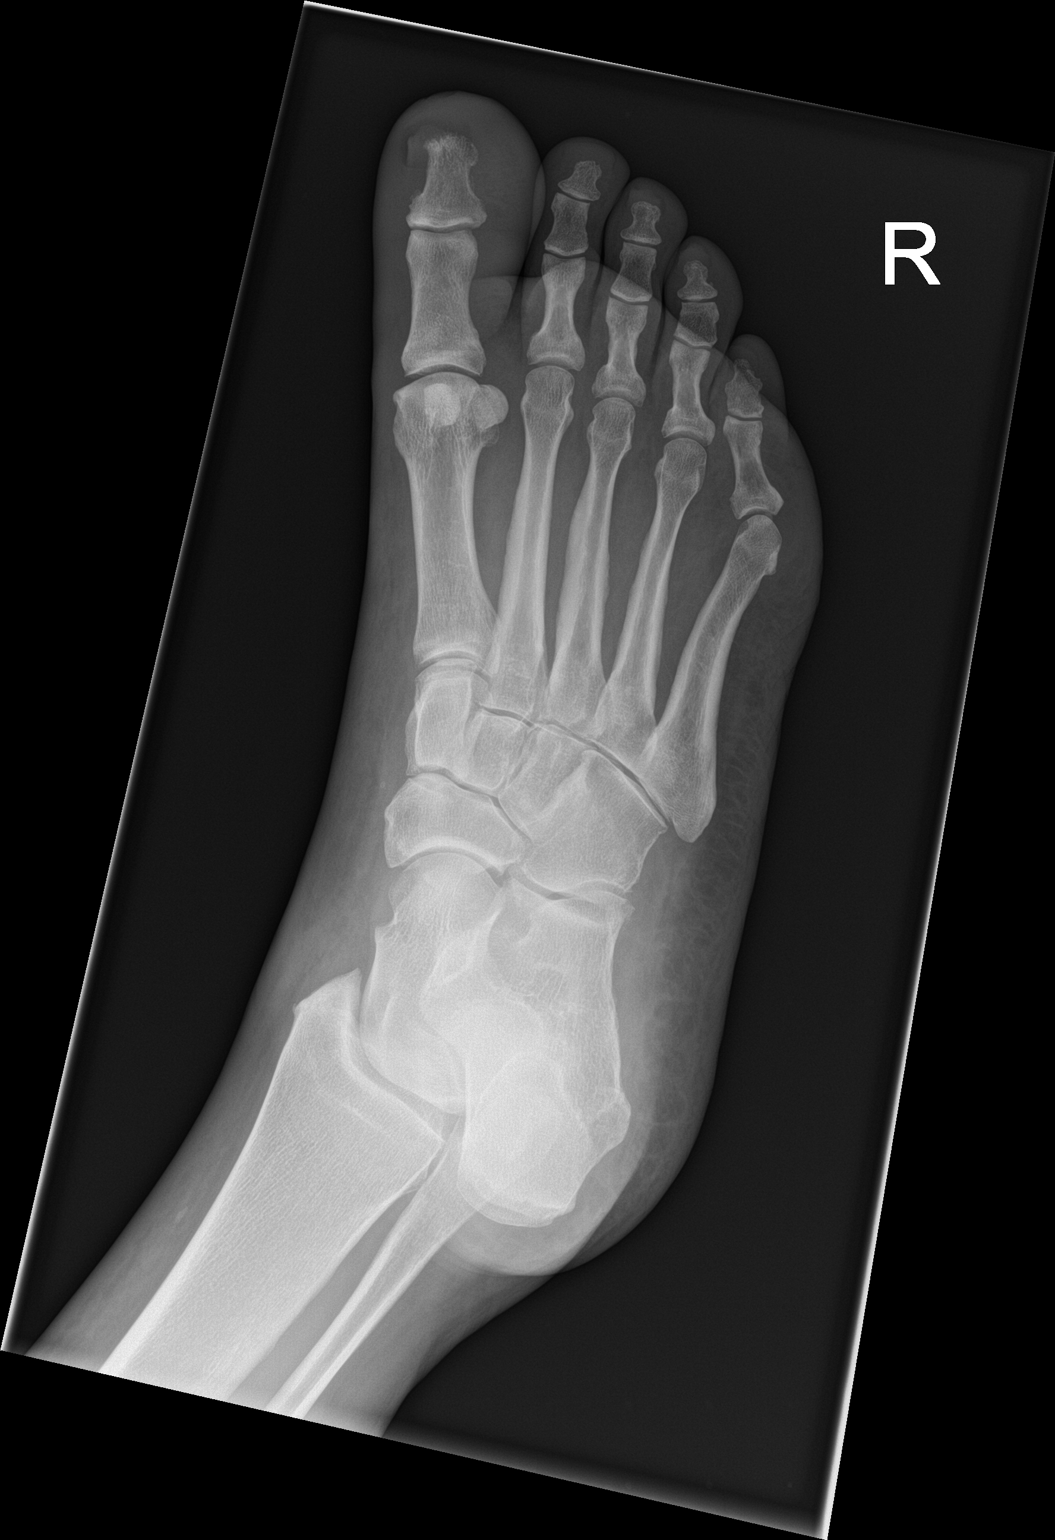

[foot lat]
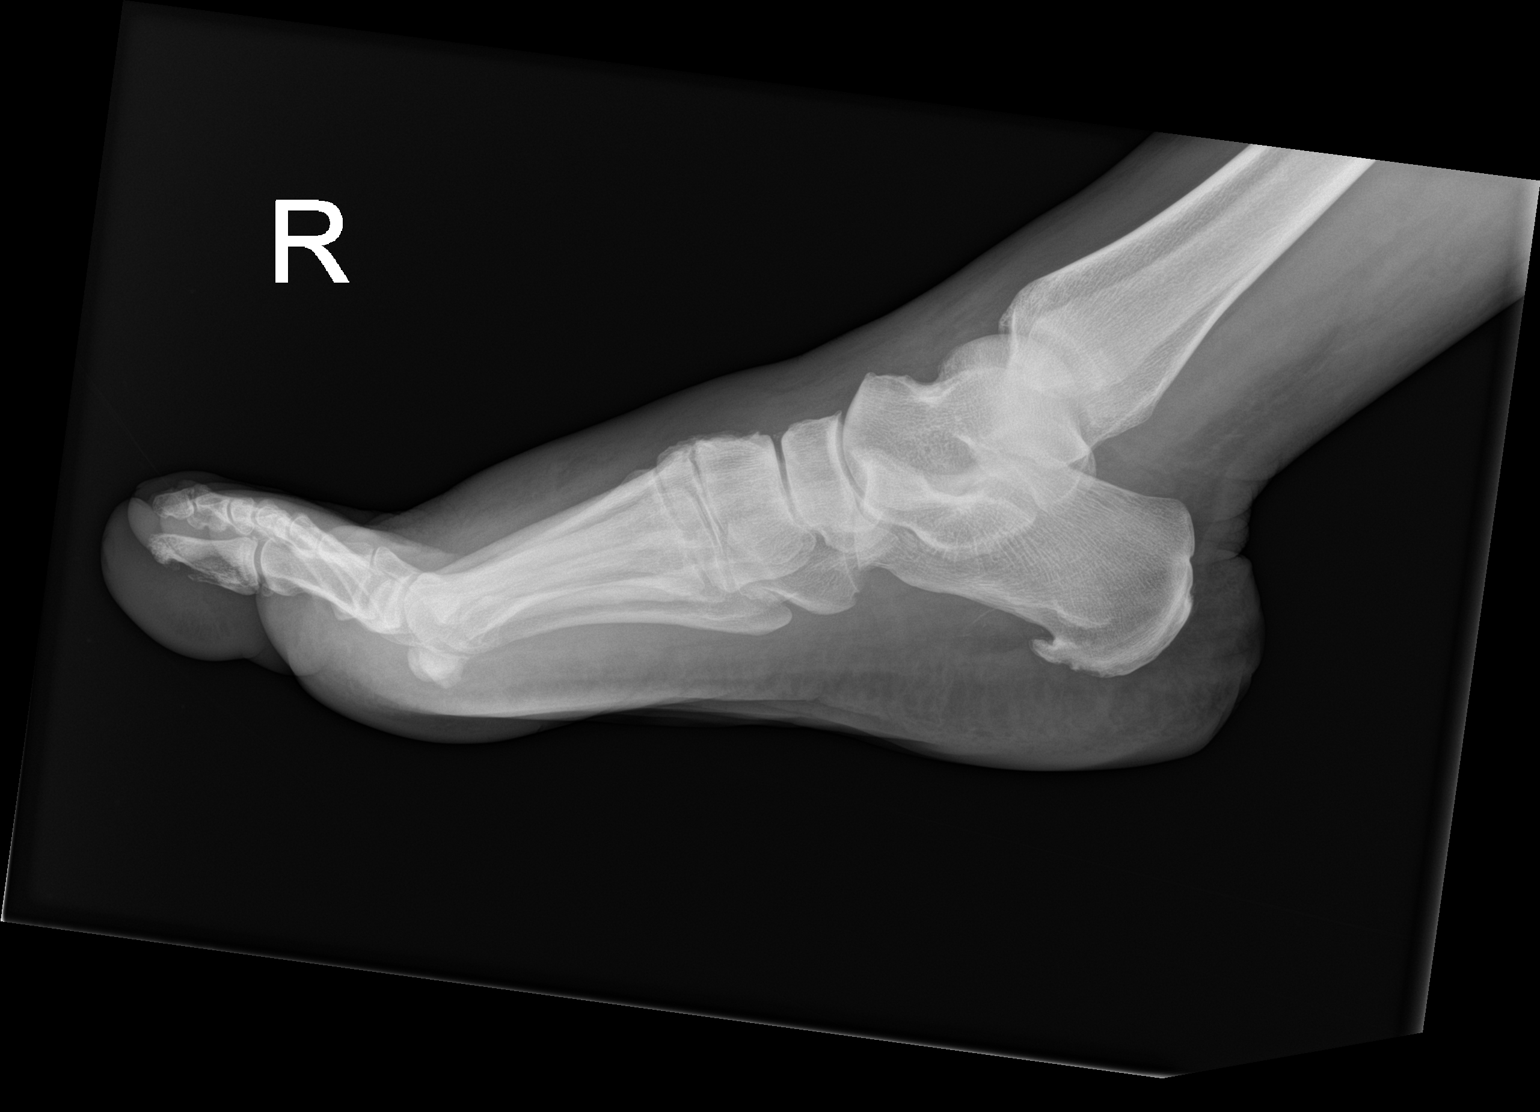

[3 of 3 positions shown; findings below may reference images not displayed]

FINDINGS: Normal alignment. No acute fracture or dislocation. Joint spaces are
preserved. Small plantar calcaneal spur. Subtle erosive changes
involving the cortex of the distal tuft of the great toe suggests
early changes of osteomyelitis. Extensive soft tissue swelling of
the great toe. Moderate soft tissue swelling of the right forefoot
and midfoot dorsally.
IMPRESSION: Extensive soft tissue swelling of the great toe and dorsal right
foot. Superimposed erosive changes involving the distal phalanx of
the great toe suggests early changes of osteomyelitis.

## 2021-05-17 NOTE — ED Notes (Signed)
Blue top sent to lab with save label. 

## 2021-05-17 NOTE — ED Triage Notes (Signed)
Pt to ED from home c/o right foot swelling x1 week.  States just noticed how swollen it was and why she came in.  States pain when she walks, denies SOB or fevers.  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  States takes blood thinner for prior stroke.  Pt A&Ox4, chest rise even and unlabored, skin WNL and in NAD at this time. ?

## 2021-05-18 ENCOUNTER — Inpatient Hospital Stay
Admission: EM | Admit: 2021-05-18 | Discharge: 2021-05-22 | DRG: 638 | Disposition: A | Discharge status: Home / Self Care | Payer: Medicaid Other | Attending: Student in an Organized Health Care Education/Training Program | Admitting: Student in an Organized Health Care Education/Training Program

## 2021-05-18 DIAGNOSIS — L089 Local infection of the skin and subcutaneous tissue, unspecified: Secondary | ICD-10-CM

## 2021-05-18 DIAGNOSIS — E11628 Type 2 diabetes mellitus with other skin complications: Secondary | ICD-10-CM | POA: Diagnosis present

## 2021-05-18 DIAGNOSIS — B9562 Methicillin resistant Staphylococcus aureus infection as the cause of diseases classified elsewhere: Secondary | ICD-10-CM | POA: Diagnosis present

## 2021-05-18 DIAGNOSIS — L97919 Non-pressure chronic ulcer of unspecified part of right lower leg with unspecified severity: Secondary | ICD-10-CM | POA: Diagnosis present

## 2021-05-18 DIAGNOSIS — L03115 Cellulitis of right lower limb: Secondary | ICD-10-CM

## 2021-05-18 DIAGNOSIS — Z8673 Personal history of transient ischemic attack (TIA), and cerebral infarction without residual deficits: Secondary | ICD-10-CM

## 2021-05-18 DIAGNOSIS — Z95828 Presence of other vascular implants and grafts: Secondary | ICD-10-CM

## 2021-05-18 DIAGNOSIS — Z7902 Long term (current) use of antithrombotics/antiplatelets: Secondary | ICD-10-CM | POA: Diagnosis not present

## 2021-05-18 DIAGNOSIS — N179 Acute kidney failure, unspecified: Secondary | ICD-10-CM | POA: Diagnosis present

## 2021-05-18 DIAGNOSIS — E119 Type 2 diabetes mellitus without complications: Secondary | ICD-10-CM

## 2021-05-18 DIAGNOSIS — E1169 Type 2 diabetes mellitus with other specified complication: Principal | ICD-10-CM

## 2021-05-18 DIAGNOSIS — I10 Essential (primary) hypertension: Secondary | ICD-10-CM

## 2021-05-18 DIAGNOSIS — Z8249 Family history of ischemic heart disease and other diseases of the circulatory system: Secondary | ICD-10-CM | POA: Diagnosis not present

## 2021-05-18 DIAGNOSIS — M869 Osteomyelitis, unspecified: Secondary | ICD-10-CM | POA: Diagnosis present

## 2021-05-18 DIAGNOSIS — Z79899 Other long term (current) drug therapy: Secondary | ICD-10-CM | POA: Diagnosis not present

## 2021-05-18 DIAGNOSIS — E11621 Type 2 diabetes mellitus with foot ulcer: Secondary | ICD-10-CM | POA: Diagnosis present

## 2021-05-18 DIAGNOSIS — Z823 Family history of stroke: Secondary | ICD-10-CM | POA: Diagnosis not present

## 2021-05-18 DIAGNOSIS — M86171 Other acute osteomyelitis, right ankle and foot: Secondary | ICD-10-CM | POA: Diagnosis not present

## 2021-05-18 DIAGNOSIS — L97519 Non-pressure chronic ulcer of other part of right foot with unspecified severity: Secondary | ICD-10-CM | POA: Diagnosis not present

## 2021-05-18 DIAGNOSIS — Z23 Encounter for immunization: Secondary | ICD-10-CM | POA: Diagnosis not present

## 2021-05-18 DIAGNOSIS — Z7982 Long term (current) use of aspirin: Secondary | ICD-10-CM | POA: Diagnosis not present

## 2021-05-18 DIAGNOSIS — Z791 Long term (current) use of non-steroidal anti-inflammatories (NSAID): Secondary | ICD-10-CM | POA: Diagnosis not present

## 2021-05-18 DIAGNOSIS — E1140 Type 2 diabetes mellitus with diabetic neuropathy, unspecified: Secondary | ICD-10-CM | POA: Diagnosis present

## 2021-05-18 DIAGNOSIS — I70235 Atherosclerosis of native arteries of right leg with ulceration of other part of foot: Secondary | ICD-10-CM | POA: Diagnosis not present

## 2021-05-18 DIAGNOSIS — Z7984 Long term (current) use of oral hypoglycemic drugs: Secondary | ICD-10-CM | POA: Diagnosis not present

## 2021-05-18 DIAGNOSIS — B961 Klebsiella pneumoniae [K. pneumoniae] as the cause of diseases classified elsewhere: Secondary | ICD-10-CM | POA: Diagnosis present

## 2021-05-18 DIAGNOSIS — M868X7 Other osteomyelitis, ankle and foot: Secondary | ICD-10-CM | POA: Diagnosis present

## 2021-05-18 DIAGNOSIS — B9620 Unspecified Escherichia coli [E. coli] as the cause of diseases classified elsewhere: Secondary | ICD-10-CM | POA: Diagnosis present

## 2021-05-18 HISTORY — DX: Cerebral infarction, unspecified: I63.9

## 2021-05-18 LAB — CBG MONITORING, ED
Glucose-Capillary: 45 mg/dL — ABNORMAL LOW (ref 70–99)
Glucose-Capillary: 47 mg/dL — ABNORMAL LOW (ref 70–99)
Glucose-Capillary: 84 mg/dL (ref 70–99)
Glucose-Capillary: 131 mg/dL — ABNORMAL HIGH (ref 70–99)
Glucose-Capillary: 106 mg/dL — ABNORMAL HIGH (ref 70–99)
Glucose-Capillary: 100 mg/dL — ABNORMAL HIGH (ref 70–99)
Glucose-Capillary: 57 mg/dL — ABNORMAL LOW (ref 70–99)
Glucose-Capillary: 135 mg/dL — ABNORMAL HIGH (ref 70–99)

## 2021-05-18 LAB — HEMOGLOBIN A1C
Hgb A1c MFr Bld: 7.2 % — ABNORMAL HIGH (ref 4.8–5.6)
Mean Plasma Glucose: 159.94 mg/dL

## 2021-05-18 LAB — GLUCOSE, CAPILLARY
Glucose-Capillary: 81 mg/dL (ref 70–99)
Glucose-Capillary: 224 mg/dL — ABNORMAL HIGH (ref 70–99)

## 2021-05-18 LAB — PREALBUMIN: Prealbumin: 11.5 mg/dL — ABNORMAL LOW (ref 18–38)

## 2021-05-18 LAB — SEDIMENTATION RATE: Sed Rate: 126 mm/hr — ABNORMAL HIGH (ref 0–30)

## 2021-05-18 LAB — C-REACTIVE PROTEIN: CRP: 12.3 mg/dL — ABNORMAL HIGH (ref <1.0)

## 2021-05-18 MED ORDER — SODIUM CHLORIDE 0.9 % IV SOLN
2.0000 g | INTRAVENOUS | Status: DC
  Administered 2021-05-19 – 2021-05-22 (×4): 2 g via INTRAVENOUS
  Filled 2021-05-18: qty 2
  Filled 2021-05-18 (×3): qty 20

## 2021-05-18 MED ORDER — METRONIDAZOLE 500 MG/100ML IV SOLN
500.0000 mg | Freq: Three times a day (TID) | INTRAVENOUS | Status: DC
  Administered 2021-05-18 – 2021-05-21 (×11): 500 mg via INTRAVENOUS
  Filled 2021-05-18 (×13): qty 100

## 2021-05-18 MED ORDER — MORPHINE SULFATE (PF) 2 MG/ML IV SOLN: 2.0000 mg | INTRAVENOUS | Status: DC | PRN

## 2021-05-18 MED ORDER — CEFTRIAXONE SODIUM 2 G IJ SOLR
2.0000 g | Freq: Once | INTRAMUSCULAR | Status: AC
  Administered 2021-05-18: 2 g via INTRAVENOUS
  Filled 2021-05-18: qty 20

## 2021-05-18 MED ORDER — SODIUM CHLORIDE 0.9 % IV SOLN: INTRAVENOUS | Status: AC

## 2021-05-18 MED ORDER — ACETAMINOPHEN 650 MG RE SUPP: 650.0000 mg | Freq: Four times a day (QID) | RECTAL | Status: DC | PRN

## 2021-05-18 MED ORDER — INSULIN ASPART 100 UNIT/ML IJ SOLN
0.0000 [IU] | INTRAMUSCULAR | Status: DC
  Administered 2021-05-18: 5 [IU] via SUBCUTANEOUS
  Administered 2021-05-19: 3 [IU] via SUBCUTANEOUS
  Administered 2021-05-19: 5 [IU] via SUBCUTANEOUS
  Administered 2021-05-19: 3 [IU] via SUBCUTANEOUS
  Administered 2021-05-20: 2 [IU] via SUBCUTANEOUS
  Administered 2021-05-20: 3 [IU] via SUBCUTANEOUS
  Filled 2021-05-18 (×6): qty 1

## 2021-05-18 MED ORDER — ZINC SULFATE 220 (50 ZN) MG PO CAPS
220.0000 mg | ORAL_CAPSULE | Freq: Every day | ORAL | Status: DC
Start: 2021-05-18 — End: 2021-05-19
  Administered 2021-05-18: 220 mg via ORAL
  Filled 2021-05-18: qty 1

## 2021-05-18 MED ORDER — VANCOMYCIN HCL 1500 MG/300ML IV SOLN
1500.0000 mg | Freq: Once | INTRAVENOUS | Status: AC
  Administered 2021-05-18: 1500 mg via INTRAVENOUS
  Filled 2021-05-18: qty 300

## 2021-05-18 MED ORDER — TETANUS-DIPHTH-ACELL PERTUSSIS 5-2.5-18.5 LF-MCG/0.5 IM SUSY
0.5000 mL | PREFILLED_SYRINGE | Freq: Once | INTRAMUSCULAR | Status: AC
  Administered 2021-05-18: 0.5 mL via INTRAMUSCULAR
  Filled 2021-05-18: qty 0.5

## 2021-05-18 MED ORDER — VANCOMYCIN HCL 750 MG/150ML IV SOLN
750.0000 mg | INTRAVENOUS | Status: DC
  Administered 2021-05-19: 750 mg via INTRAVENOUS
  Filled 2021-05-18: qty 150

## 2021-05-18 MED ORDER — ONDANSETRON HCL 4 MG/2ML IJ SOLN: 4.0000 mg | Freq: Four times a day (QID) | INTRAMUSCULAR | Status: DC | PRN

## 2021-05-18 MED ORDER — ACETAMINOPHEN 325 MG PO TABS: 650.0000 mg | ORAL_TABLET | Freq: Four times a day (QID) | ORAL | Status: DC | PRN

## 2021-05-18 MED ORDER — ENSURE MAX PROTEIN PO LIQD
11.0000 [oz_av] | Freq: Every day | ORAL | Status: DC
  Administered 2021-05-18 – 2021-05-19 (×2): 11 [oz_av] via ORAL
  Filled 2021-05-18: qty 330

## 2021-05-18 MED ORDER — ASCORBIC ACID 500 MG PO TABS
500.0000 mg | ORAL_TABLET | Freq: Two times a day (BID) | ORAL | Status: DC
  Administered 2021-05-18: 500 mg via ORAL
  Filled 2021-05-18: qty 1

## 2021-05-18 MED ORDER — ADULT MULTIVITAMIN W/MINERALS CH
1.0000 | ORAL_TABLET | Freq: Every day | ORAL | Status: DC
  Administered 2021-05-18 – 2021-05-22 (×4): 1 via ORAL
  Filled 2021-05-18 (×4): qty 1

## 2021-05-18 MED ORDER — HYDROCODONE-ACETAMINOPHEN 5-325 MG PO TABS: 1.0000 | ORAL_TABLET | ORAL | Status: DC | PRN

## 2021-05-18 MED ORDER — JUVEN PO PACK
1.0000 | PACK | Freq: Two times a day (BID) | ORAL | Status: DC
  Administered 2021-05-20 – 2021-05-22 (×5): 1 via ORAL

## 2021-05-18 MED ORDER — ONDANSETRON HCL 4 MG PO TABS: 4.0000 mg | ORAL_TABLET | Freq: Four times a day (QID) | ORAL | Status: DC | PRN

## 2021-05-18 MED ORDER — SODIUM CHLORIDE 0.9 % IV BOLUS
1000.0000 mL | Freq: Once | INTRAVENOUS | Status: AC
  Administered 2021-05-18: 1000 mL via INTRAVENOUS

## 2021-05-18 NOTE — ED Notes (Signed)
Messaged surgery PA Z. Manus Rudd to inquire if plan for surgery today and, if not, if pt can have diet order. Awaiting reply.  ?

## 2021-05-18 NOTE — ED Notes (Signed)
Report received from [facility],  pt moved to room 30 awaiting hospital room availability. Pt has no complaints at this time, will continue to monitor.  ?

## 2021-05-18 NOTE — Consult Note (Signed)
?[facility] ?Vascular Consult Note ? ?MRN : [id number] ? ?Sharon Gray is a 56 y.o. (February 05, 1965) female who presents with chief complaint of  ?Chief Complaint  ?Patient presents with  ? Foot Swelling  ?. ? ? ?Consulting Physician:Hazel Para March, MD ?Reason for consult: Right lower extremity wound with osteomyelitis ?History of Present Illness: Sharon Gray 56 year old female who presents to [facility] in the setting of a right great toe infection.  The patient is known to our practice for previously placed right carotid stent.  She notes that the wound on her right toe started several weeks ago.  She has been having swelling and pain associated with drainage.  She also notes some numbness to her foot.  She has a past medical history of carotid artery stenosis, diabetes mellitus, hypertension, and CVA ? ?Current Facility-Administered Medications  ?Medication Dose Route Frequency Provider Last Rate Last Admin  ? acetaminophen (TYLENOL) tablet 650 mg  650 mg Oral Q6H PRN Andris Baumann, MD      ? Or  ? acetaminophen (TYLENOL) suppository 650 mg  650 mg Rectal Q6H PRN Andris Baumann, MD      ? Melene Muller ON 05/19/2021] cefTRIAXone (ROCEPHIN) 2 g in sodium chloride 0.9 % 100 mL IVPB  2 g Intravenous Q24H Andris Baumann, MD      ? HYDROcodone-acetaminophen (NORCO/VICODIN) 5-325 MG per tablet 1-2 tablet  1-2 tablet Oral Q4H PRN Andris Baumann, MD      ? insulin aspart (novoLOG) injection 0-15 Units  0-15 Units Subcutaneous Q4H Lindajo Royal V, MD      ? metroNIDAZOLE (FLAGYL) IVPB 500 mg  500 mg Intravenous Q8H Andris Baumann, MD   Stopped at 05/18/21 1323  ? morphine (PF) 2 MG/ML injection 2 mg  2 mg Intravenous Q2H PRN Andris Baumann, MD      ? ondansetron [facility]) tablet 4 mg  4 mg Oral Q6H PRN Andris Baumann, MD      ? Or  ? ondansetron [facility]) injection 4 mg  4 mg Intravenous Q6H PRN Andris Baumann, MD      ? Melene Muller ON 05/19/2021] vancomycin (VANCOREADY) IVPB 750 mg/150 mL   750 mg Intravenous Q24H Andris Baumann, MD      ? ? ?Past Medical History:  ?Diagnosis Date  ? Diabetes mellitus without complication (HCC)   ? Hypertension   ? Stroke [facility])   ? ? ?Past Surgical History:  ?Procedure Laterality Date  ? APPENDECTOMY    ? CAROTID PTA/STENT INTERVENTION Right 07/26/2020  ? Procedure: CAROTID PTA/STENT INTERVENTION;  Surgeon: Renford Dills, MD;  Location: [facility] INVASIVE CV LAB;  Service: Cardiovascular;  Laterality: Right;  ? NO PAST SURGERIES    ? ? ?Social History ?Social History  ? ?Tobacco Use  ? Smoking status: Never  ? Smokeless tobacco: Never  ?Substance Use Topics  ? Alcohol use: Yes  ?  Comment: socially: only at big parties (2 years ago)  ? Drug use: Not Currently  ? ? ?Family History ?Family History  ?Problem Relation Age of Onset  ? Stroke Father   ? ? ?No Known Allergies ? ? ?REVIEW OF SYSTEMS (Negative unless checked) ? ?Constitutional: [] Weight loss  [] Fever  [] Chills ?Cardiac: [] Chest pain   [] Chest pressure   [] Palpitations   [] Shortness of breath when laying flat   [] Shortness of breath at rest   [] Shortness of breath with exertion. ?Vascular:  [] Pain in legs with walking   []   Pain in legs at rest   [] Pain in legs when laying flat   [] Claudication   [] Pain in feet when walking  [] Pain in feet at rest  [] Pain in feet when laying flat   [] History of DVT   [] Phlebitis   [] Swelling in legs   [] Varicose veins   [x] Non-healing ulcers ?Pulmonary:   [] Uses home oxygen   [] Productive cough   [] Hemoptysis   [] Wheeze  [] COPD   [] Asthma ?Neurologic:  [] Dizziness  [] Blackouts   [] Seizures   [] History of stroke   [] History of TIA  [] Aphasia   [] Temporary blindness   [] Dysphagia   [] Weakness or numbness in arms   [] Weakness or numbness in legs ?Musculoskeletal:  [] Arthritis   [] Joint swelling   [] Joint pain   [] Low back pain ?Hematologic:  [] Easy bruising  [] Easy bleeding   [] Hypercoagulable state   [] Anemic  [] Hepatitis ?Gastrointestinal:  [] Blood in stool   [] Vomiting blood   [] Gastroesophageal reflux/heartburn   [] Difficulty swallowing. ?Genitourinary:  [] Chronic kidney disease   [] Difficult urination  [] Frequent urination  [] Burning with urination   [] Blood in urine ?Skin:  [] Rashes   [] Ulcers   [] Wounds ?Psychological:  [] History of anxiety   []  History of major depression. ? ?Physical Examination ? ?Vitals:  ? 05/18/21 1000 05/18/21 1100 05/18/21 1200 05/18/21 1455  ?BP: 138/65 (!) 145/71 (!) 168/71 140/72  ?Pulse: 77 83 88 85  ?Resp:    16  ?Temp:    98.8 ?F (37.1 ?C)  ?TempSrc:      ?SpO2: 99% 100% 100% 100%  ?Weight:      ?Height:      ? ?Body mass index is 30.11 kg/m?. ?Gen:  WD/WN, NAD ?Head: [place]/AT, No temporalis wasting. Prominent temp pulse not noted. ?Ear/Nose/Throat: Hearing grossly intact, nares w/o erythema or drainage, oropharynx w/o Erythema/Exudate ?Eyes: Sclera non-icteric, conjunctiva clear ?Neck: Trachea midline.  No JVD.  ?Pulmonary:  Good air movement, respirations not labored, equal bilaterally.  ?Cardiac: RRR, normal S1, S2. ?Vascular: No edema noted to leg ?Vessel Right Left  ?PT Palpable Palpable  ?DP Palpable Palpable  ? ?Gastrointestinal: soft, non-tender/non-distended. No guarding/reflex.  ?Musculoskeletal: M/S 5/5 throughout.  Extremities without ischemic changes.  No deformity or atrophy. No edema. ?Neurologic: Sensation grossly intact in extremities.  Symmetrical.  Speech is fluent. Motor exam as listed above. ?Psychiatric: Judgment intact, Mood & affect appropriate for pt's clinical situation. ? ? ? ? ?CBC ?Lab Results  ?Component Value Date  ? WBC 10.1 05/17/2021  ? HGB 10.5 (L) 05/17/2021  ? HCT 33.2 (L) 05/17/2021  ? MCV 87.8 05/17/2021  ? PLT 268 05/17/2021  ? ? ?BMET ?   ?Component Value Date/Time  ? NA 136 05/17/2021 2242  ? NA 137 01/27/2014 0144  ? K 4.1 05/17/2021 2242  ? K 4.1 01/27/2014 0144  ? CL 102 05/17/2021 2242  ? CL 102 01/27/2014 0144  ? CO2 24 05/17/2021 2242  ? CO2 27 01/27/2014 0144  ? GLUCOSE 171 (H) 05/17/2021 2242  ? GLUCOSE 401  (H) 01/27/2014 0144  ? BUN 39 (H) 05/17/2021 2242  ? BUN 18 01/27/2014 0144  ? CREATININE 1.36 (H) 05/17/2021 2242  ? CREATININE 0.93 01/27/2014 0144  ? CALCIUM 8.6 (L) 05/17/2021 2242  ? CALCIUM 8.4 (L) 01/27/2014 0144  ? GFRNONAA 46 (L) 05/17/2021 2242  ? GFRNONAA >60 01/27/2014 0144  ? GFRAA >60 01/27/2014 0144  ? ?Estimated Creatinine Clearance: 46.5 mL/min (A) (by C-G formula based on SCr of 1.36 mg/dL (H)). ? ?COAG ?Lab  Results  ?Component Value Date  ? INR 1.0 06/15/2020  ? ? ?Radiology ?DG Foot Complete Right ? ?Result Date: 05/17/2021 ?CLINICAL DATA:  Right foot pain and swelling EXAM: RIGHT FOOT COMPLETE - 3+ VIEW COMPARISON:  None. FINDINGS: Normal alignment. No acute fracture or dislocation. Joint spaces are preserved. Small plantar calcaneal spur. Subtle erosive changes involving the cortex of the distal tuft of the great toe suggests early changes of osteomyelitis. Extensive soft tissue swelling of the great toe. Moderate soft tissue swelling of the right forefoot and midfoot dorsally. IMPRESSION: Extensive soft tissue swelling of the great toe and dorsal right foot. Superimposed erosive changes involving the distal phalanx of the great toe suggests early changes of osteomyelitis. Electronically Signed   By: Ashesh  Parikh M.D.   On: 05/17/2021 23:08   ? ? ? ?Assessment/Plan ?1.  Osteomyelitis right great toe ? ? Recommend: ? ?The patient has significant atherosclerotic risk factors including a previous history of significant carotid artery disease, hypertension, and diabetes mellitus.  Given the extensiveness of the wound as well as osteomyelitis, and the desires of the patient to try to salvage her toe, angiogram is recommended for evaluation of circulation as well as possible intervention. ? ?Patient should undergo angiography of the right lower extremity with the hope for intervention for limb salvage.  The risks and benefits as well as the alternative therapies was discussed in detail with the  patient.  All questions were answered.  Patient agrees to proceed with right lower extremity angiography. ? ?I also discussed with patient that despite the angiogram, the patient may still still ultimately ne

## 2021-05-18 NOTE — ED Notes (Addendum)
Pt eating from lunch tray. Mayra NT gave pt Oj as well.  ?

## 2021-05-18 NOTE — ED Notes (Signed)
Pt stated she thought her BG was low; checked and BG 135 currently.  ?

## 2021-05-18 NOTE — H&P (Addendum)
?History and Physical  ? ? ?Patient: Sharon Gray [id number] DOB: 09-13-1965 ?DOA: 05/18/2021 ?DOS: the patient was seen and examined on 05/18/2021 ?PCP: Perrin Maltese, MD  ?Patient coming from: Home ? ?Chief Complaint:  ?Chief Complaint  ?Patient presents with  ? Foot Swelling  ? ? ?HPI: Sharon Gray is a 56 y.o. female with medical history significant for DM, HTN, CVA, right carotid artery stenosis s/p ICA stent, who presents to the ED with pain and swelling of the right great toe that started after possible injury to the toe while gardening a couple weeks prior.  Over the past several days she noted worsening of the swelling and noted increased pain, associated with redness to the dorsum of her right foot streaking up the calf. She has no fever or chills. ?ED course and data review: BP 161/74 with otherwise normal vitals.  Labs with WBC 10,000, hemoglobin 10.5, creatinine 1.36 up from baseline of 0.8.  CRP pending.  X-ray right foot showing the following: ?IMPRESSION: ?Extensive soft tissue swelling of the great toe and dorsal right ?foot. Superimposed erosive changes involving the distal phalanx of ?the great toe suggests early changes of osteomyelitis. ? ?Patient started on vancomycin and Rocephin.  Hospitalist consulted for admission. ? ?  ? ?Review of Systems: As mentioned in the history of present illness. All other systems reviewed and are negative. ?Past Medical History:  ?Diagnosis Date  ? Diabetes mellitus without complication ([place])   ? Hypertension   ? Stroke [facility])   ? ?Past Surgical History:  ?Procedure Laterality Date  ? APPENDECTOMY    ? CAROTID PTA/STENT INTERVENTION Right 07/26/2020  ? Procedure: CAROTID PTA/STENT INTERVENTION;  Surgeon: Katha Cabal, MD;  Location: [place] CV LAB;  Service: Cardiovascular;  Laterality: Right;  ? NO PAST SURGERIES    ? ?Social History:  reports that she has never smoked. She has never used smokeless tobacco. She reports current alcohol use. She reports  that she does not currently use drugs. ? ?No Known Allergies ? ?Family History  ?Problem Relation Age of Onset  ? Stroke Father   ? ? ?Prior to Admission medications   ?Medication Sig Start Date End Date Taking? Authorizing Provider  ?ACCU-CHEK GUIDE test strip USE TO CHECK BLOOD SUGAR THREE TIMES A DAY 08/07/20  Yes [provider]  ?acetaminophen (TYLENOL) 500 MG tablet Take 500 mg by mouth every 6 (six) hours as needed for moderate pain or mild pain.   Yes [provider]  ?Artificial Tear Solution (TEARS NATURALE OP) Place 1 drop into both eyes daily as needed (itchy eye).   Yes [provider]  ?aspirin EC 81 MG EC tablet Take 1 tablet (81 mg total) by mouth daily. Swallow whole. 07/27/20  Yes Stegmayer, Janalyn Harder, PA-C  ?baclofen (LIORESAL) 10 MG tablet Take 10 mg by mouth at bedtime. 05/10/21  Yes [provider]  ?celecoxib (CELEBREX) 200 MG capsule Take 200 mg by mouth daily. 04/17/21  Yes [provider]  ?clopidogrel (PLAVIX) 75 MG tablet Take 1 tablet (75 mg total) by mouth daily. 07/27/20 05/18/21 Yes Stegmayer, Joelene Millin A, PA-C  ?enalapril (VASOTEC) 2.5 MG tablet Take 2.5 mg by mouth daily. 08/04/20  Yes [provider]  ?FARXIGA 10 MG TABS tablet Take 10 mg by mouth daily. 08/09/20  Yes [provider]  ?glimepiride (AMARYL) 4 MG tablet Take 4 mg by mouth 2 (two) times daily. 06/09/20  Yes [provider]  ?Multiple Vitamin (MULTIVITAMIN) tablet Take 1 tablet  by mouth daily.   Yes [provider]  ?rosuvastatin (CRESTOR) 40 MG tablet Take 40 mg by mouth daily. 06/09/20  Yes [provider]  ?Vitamin D, Ergocalciferol, (DRISDOL) 1.25 MG (50000 UNIT) CAPS capsule Take 50,000 Units by mouth once a week. 05/08/20  Yes [provider]  ? ? ?Physical Exam: ?Vitals:  ? 05/17/21 2238 05/17/21 2239 05/18/21 0117  ?BP: (!) 161/74  (!) 155/75  ?Pulse: 94  92  ?Resp: 16  20  ?Temp: 98.9 ?F (37.2 ?C)    ?TempSrc: Oral    ?SpO2:  98%  100%  ?Weight:  77.1 kg   ?Height:  5\' 3"  (1.6 m)   ? ?Physical Exam ?Vitals and nursing note reviewed.  ?Constitutional:   ?   General: She is not in acute distress. ?HENT:  ?   Head: Normocephalic and atraumatic.  ?Cardiovascular:  ?   Rate and Rhythm: Normal rate and regular rhythm.  ?   Pulses: Normal pulses.  ?   Heart sounds: Normal heart sounds.  ?Pulmonary:  ?   Effort: Pulmonary effort is normal.  ?   Breath sounds: Normal breath sounds.  ?Abdominal:  ?   Palpations: Abdomen is soft.  ?   Tenderness: There is no abdominal tenderness.  ?Musculoskeletal:  ?   Comments: Wound to tip of right great toe with swelling.  Redness dorsum of right foot with tenderness extending to posterior calf  ?Neurological:  ?   Mental Status: Mental status is at baseline.  ? ? ? ?Data Reviewed: ?Relevant notes from primary care and specialist visits, past discharge summaries as available in EHR, including Care Everywhere. ?Prior diagnostic testing as pertinent to current admission diagnoses ?Updated medications and problem lists for reconciliation ?ED course, including vitals, labs, imaging, treatment and response to treatment ?Triage notes, nursing and pharmacy notes and ED provider's notes ?Notable results as noted in HPI ? ? ?Assessment and Plan: ?* Osteomyelitis of great toe of right foot (HCC) ?Zosyn and vancomycin ?Podiatry and vascular consults ?We will keep n.p.o. in case of procedure.  SCD for DVT prophylaxis ? ?Cellulitis of right lower leg ?Management as above ? ?AKI (acute kidney injury) (HCC) ?IV hydration and monitor renal function ? ?Internal carotid artery stent present, right ?No acute  issues ? ?History of CVA (cerebrovascular accident) ?Continue antiplatelet and statins ? ?Hypertension ?Uncontrolled. Continue enalapril ? ? ?Type 2 diabetes mellitus (HCC) ?SSI ? ? ? ?Advance Care Planning:   Code Status: Prior  ? ?Consults: podiatry Dr. Ether Griffins and vascular Dr. Marena Chancy ? ?Family Communication:  none ? ?Severity of Illness: ?The appropriate patient status for this patient is INPATIENT. Inpatient status is judged to be reasonable and necessary in order to provide the required intensity of service to ensure the patient's safety. The patient's presenting symptoms, physical exam findings, and initial radiographic and laboratory data in the context of their chronic comorbidities is felt to place them at high risk for further clinical deterioration. Furthermore, it is not anticipated that the patient will be medically stable for discharge from the hospital within 2 midnights of admission.  ? ?* I certify that at the point of admission it is my clinical judgment that the patient will require inpatient hospital care spanning beyond 2 midnights from the point of admission due to high intensity of service, high risk for further deterioration and high frequency of surveillance required.* ? ?Author: ?Andris Baumann, MD ?05/18/2021 2:17 AM ? ?For on call review [email or web address].  ?

## 2021-05-18 NOTE — Assessment & Plan Note (Signed)
Continue antiplatelet and statins ?

## 2021-05-18 NOTE — ED Notes (Addendum)
Pt inquiring about if she will have surgery today or if she can have a diet order to eat. Nothing to confirm surgery plans for today in notes so this RN paged attending Dareen Piano C. Awaiting reply/orders. ?

## 2021-05-18 NOTE — ED Notes (Signed)
Messaged attending Rebbeca Paul. Again via secure chat in hopes for diet order if pt not scheduled for surgery today. Awaiting reply.  ?

## 2021-05-18 NOTE — Progress Notes (Signed)
Initial Nutrition Assessment ? ?DOCUMENTATION CODES:  ? ?Obesity unspecified ? ?INTERVENTION:  ? ?-1 packet Juven BID, each packet provides 95 calories, 2.5 grams of protein (collagen), and 9.8 grams of carbohydrate (3 grams sugar); also contains 7 grams of L-arginine and L-glutamine, 300 mg vitamin C, 15 mg vitamin E, 1.2 mcg vitamin B-12, 9.5 mg zinc, 200 mg calcium, and 1.5 g  Calcium Beta-hydroxy-Beta-methylbutyrate to support wound healing  ?-Ensure Max po daily, each supplement provides 150 kcal and 30 grams of protein. ?-MVI with minerals daily ?-500 mg vitamin C BID ?-220 mg zinc sulfate daily x 14 days ? ?NUTRITION DIAGNOSIS:  ? ?Increased nutrient needs related to wound healing as evidenced by estimated needs. ? ?GOAL:  ? ?Patient will meet greater than or equal to 90% of their needs ? ?MONITOR:  ? ?PO intake, Supplement acceptance, Labs, Weight trends, Skin, I & O's ? ?REASON FOR ASSESSMENT:  ? ?Consult ?Assessment of nutrition requirement/status, Wound healing ? ?ASSESSMENT:  ? ?Sharon Gray is a 56 y.o. female with medical history significant for DM, HTN, CVA, right carotid artery stenosis s/p ICA stent, who presents to the ED with pain and swelling of the right great toe that started after possible injury to the toe while gardening a couple weeks prior.  Over the past several days she noted worsening of the swelling and noted increased pain, associated with redness to the dorsum of her right foot streaking up the calf. She has no fever or chills. ? ?Pt admitted with osteomyelitis of rt great toe.  ? ?Reviewed I/O's: +1.4 L x 24 hours ? ?Pt out of room at time of visit. No family at bedside to provide additional history. RD unable to obtain further nutrition-related history or complete nutrition-focused physical exam at this time.   ? ?Reviewed wt hx; pt has experienced a 4.3% wt loss over the past year, which is not significant for time frame.  ? ?Medications reviewed.  ?  ?Per podiatry notes, partial  toe amputation is recommended. Pt is hesitant and would like to continue with antibiotics.  ? ?Lab Results  ?Component Value Date  ? HGBA1C 7.2 (H) 05/18/2021  ? PTA DM medications are none.  ? ?Labs reviewed: CBGS: 57-135 (inpatient orders for glycemic control are 0-15 units inuslin aspart every 4 hours).   ? ?Diet Order:   ?Diet Order   ? ?       ?  Diet regular Room service appropriate? Yes; Fluid consistency: Thin  Diet effective now       ?  ? ?  ?  ? ?  ? ? ?EDUCATION NEEDS:  ? ?No education needs have been identified at this time ? ?Skin:  Skin Assessment: Reviewed RN Assessment ? ?Last BM:  Unknown ? ?Height:  ? ?Ht Readings from Last 1 Encounters:  ?05/17/21 5\' 3"  (1.6 m)  ? ? ?Weight:  ? ?Wt Readings from Last 1 Encounters:  ?05/17/21 77.1 kg  ? ? ?Ideal Body Weight:  52.3 kg ? ?BMI:  Body mass index is 30.11 kg/m?. ? ?Estimated Nutritional Needs:  ? ?Kcal:  1800-2000 ? ?Protein:  105-120 grams ? ?Fluid:  > 1.8 L ? ? ? ?05/19/21, RD, LDN, CDCES ?Registered Dietitian II ?Certified Diabetes Care and Education Specialist ?Please refer to [facility] for RD and/or RD on-call/weekend/after hours pager  ?

## 2021-05-18 NOTE — ED Notes (Signed)
Contacted J. Fowler with podiatry as surgery states they are not consulted to this pt. Awaiting reply about diet order. ?

## 2021-05-18 NOTE — Consult Note (Signed)
WOC Nurse Consult Note: ?Reason for Consult:Wound to distal tip of right great toe. Vascular surgery and Podiatric Medicine have been simultaneously consulted. WOC nursing orders are superceded by any that those specialty physicians might enter. Antibiotics have been ordered.  ?Wound type: Neuropathic, infectious ?Pressure Injury POA: N/A ?Measurement:1cm x 1.5cm x 0.2cm ?Wound bed:red, moist ?Drainage (amount, consistency, odor) small serous ?Periwound:edematous, erythematous ?Dressing procedure/placement/frequency: Conservative guidance for topical care is provided until Vascular and/or Podiatric Medicine see patient.  ?I have provided Nursing with guidance for daily care using a soap and water cleanse, rinse and gentle dry followed by covering of the lesion with an antimicrobial nonadherent (xeroform) gauze topped with a dry gauze 2x2 secured with conform bandaging and paper tape. Dressing is to be changed daily and PRN dislodgement. ? ?WOC nursing team will not follow, but will remain available to this patient, the nursing and medical teams.  Please re-consult if needed. ?Thanks, ?Ladona Mow, MSN, RN, GNP, CWOCN, CWON-AP, FAAN  ?Pager# [phone]  ? ? ? ?  ?

## 2021-05-18 NOTE — ED Notes (Signed)
Called dietary to make sure pt gets lunch tray. Stated they will send one soon. Pt given water as requested by Mayra NT.  ?

## 2021-05-18 NOTE — Progress Notes (Signed)
Admission profile updated. ?

## 2021-05-18 NOTE — Assessment & Plan Note (Signed)
Uncontrolled. Continue enalapril ? ?

## 2021-05-18 NOTE — ED Notes (Signed)
Pt up to restroom.

## 2021-05-18 NOTE — ED Notes (Addendum)
Pt to ED for foot swelling and toe pain. Pt states her toe has been hurting for 2 weeks, and pt noticed her L lower leg was swollen for a couple of days. Pt has open sore present to R great toe with small amount of drainage. Pt denies fevers, but states she has had some recent chills. Denies CP, SOB ? ?Pt has hx of DM; states she use to take insulin but is now on metformin.  ?

## 2021-05-18 NOTE — Progress Notes (Signed)
?PROGRESS NOTE ? ?Sharon Gray    DOB: 20-May-1965, 56 y.o.  ?[id number]  ?  Code Status: Full Code   ?DOA: 05/18/2021   LOS: 0  ? ?Brief hospital course  ?Sharon Gray is a 56 y.o. female with a PMH significant for DM, HTN, CVA, right carotid artery stenosis s/p ICA stent. ? ?They presented from home to the ED on 05/18/2021 with pain and swelling of right foot x several days. Likely from minor injury in garden a couple weeks ago, per patient. ? ?In the ED, it was found that they had osteomyelitis of right great toe as seen on xray.  ? ?They were treated with IV vancomycin and rocephin. ID, podiatry, and vascular were consulted.  ?Patient was admitted to medicine service for further workup and management of osteomyelitis as outlined in detail below. ? ?05/18/21 -stable ? ?Assessment & Plan  ?Principal Problem: ?  Osteomyelitis of great toe of right foot ([place]) ?Active Problems: ?  Cellulitis of right lower leg ?  AKI (acute kidney injury) ([place]) ?  Type 2 diabetes mellitus ([place]) ?  Hypertension ?  History of CVA (cerebrovascular accident) ?  Internal carotid artery stent present, right ? ?Osteomyelitis of great toe of right foot (HCC)  Cellulitis of right lower leg- stable. afebrile ?- continue Zosyn and vancomycin, appreciate ID recommendations ?Podiatry and vascular consults ?We will keep n.p.o. in case of procedure.  SCD for DVT prophylaxis ?  ?AKI ([place]) ?- BMP am ?  ?Internal carotid artery stent present, right ?No acute  issues ?  ?History of CVA- stable ?Continue antiplatelet and statins ?  ?Hypertension- pressures normotensive to mildly increased.  ?- continue to monitor ?- Continue enalapril ?   ?Type 2 diabetes mellitus ([place])- A1c 7.2 on admission. Better control for improved wound healing. ?- SSI ? ?Body mass index is 30.11 kg/m?. ? ?VTE ppx: SCDs Start: 05/18/21 0235 ? ? ?Diet:  ?   ?Diet  ? Diet NPO time specified  ? ?Consultants: ?ID, vascular, podiatry ?Subjective 05/18/21   ? ?Pt reports feeling well. She  has no pain currently.  ?  ?Objective  ? ?Vitals:  ? 05/18/21 0500 05/18/21 0700 05/18/21 0730 05/18/21 0800  ?BP: (!) 195/91 124/64 122/63 113/65  ?Pulse: 94 85 84 85  ?Resp: 16     ?Temp:      ?TempSrc:      ?SpO2: 100% 98% 98% 98%  ?Weight:      ?Height:      ? ? ?Intake/Output Summary (Last 24 hours) at 05/18/2021 [id number] ?Last data filed at 05/18/2021 0600 ?Gross per 24 hour  ?Intake 1400 ml  ?Output --  ?Net 1400 ml  ? ?Filed Weights  ? 05/17/21 2239  ?Weight: 77.1 kg  ?  ? ?Physical Exam:  ?General: awake, alert, NAD ?Respiratory: normal respiratory effort. ?Cardiovascular: quick capillary refill, normal S1/S2, RRR, no JVD, murmurs ?Nervous: A&O x3. no gross focal neurologic deficits, normal speech ?Extremities: moves all equally, normal tone ?Skin: erythema and swelling to right foot, greatest on great toe. Increased warmth. Sensation and Rom intact. L foot normal. ?Psychiatry: normal mood, congruent affect ? ?Labs   ?I have personally reviewed the following labs and imaging studies ?CBC ?   ?Component Value Date/Time  ? WBC 10.1 05/17/2021 2242  ? RBC 3.78 (L) 05/17/2021 2242  ? HGB 10.5 (L) 05/17/2021 2242  ? HGB 13.3 01/27/2014 0144  ? HCT 33.2 (L) 05/17/2021 2242  ? HCT 39.3 01/27/2014 0144  ? PLT 268  05/17/2021 2242  ? PLT 283 01/27/2014 0144  ? MCV 87.8 05/17/2021 2242  ? MCV 89 01/27/2014 0144  ? MCH 27.8 05/17/2021 2242  ? MCHC 31.6 05/17/2021 2242  ? RDW 12.9 05/17/2021 2242  ? RDW 12.2 01/27/2014 0144  ? LYMPHSABS 3.4 06/15/2020 1448  ? LYMPHSABS 3.8 (H) 01/27/2014 0144  ? MONOABS 0.8 06/15/2020 1448  ? MONOABS 0.9 01/27/2014 0144  ? EOSABS 0.2 06/15/2020 1448  ? EOSABS 0.4 01/27/2014 0144  ? BASOSABS 0.0 06/15/2020 1448  ? BASOSABS 0.1 01/27/2014 0144  ? ? ?  Latest Ref Rng & Units 05/17/2021  ? 10:42 PM 08/29/2020  ?  1:22 PM 07/27/2020  ?  5:26 AM  ?BMP  ?Glucose 70 - 99 mg/dL 171    117    ?BUN 6 - 20 mg/dL 39    27    ?Creatinine 0.44 - 1.00 mg/dL 1.36   0.80   0.82    ?Sodium 135 - 145 mmol/L 136     136    ?Potassium 3.5 - 5.1 mmol/L 4.1    4.0    ?Chloride 98 - 111 mmol/L 102    106    ?CO2 22 - 32 mmol/L 24    26    ?Calcium 8.9 - 10.3 mg/dL 8.6    8.7    ? ? ?DG Foot Complete Right ? ?Result Date: 05/17/2021 ?CLINICAL DATA:  Right foot pain and swelling EXAM: RIGHT FOOT COMPLETE - 3+ VIEW COMPARISON:  None. FINDINGS: Normal alignment. No acute fracture or dislocation. Joint spaces are preserved. Small plantar calcaneal spur. Subtle erosive changes involving the cortex of the distal tuft of the great toe suggests early changes of osteomyelitis. Extensive soft tissue swelling of the great toe. Moderate soft tissue swelling of the right forefoot and midfoot dorsally. IMPRESSION: Extensive soft tissue swelling of the great toe and dorsal right foot. Superimposed erosive changes involving the distal phalanx of the great toe suggests early changes of osteomyelitis. Electronically Signed   By: Fidela Salisbury M.D.   On: 05/17/2021 23:08   ? ?Disposition Plan & Communication  ?Patient status: Inpatient  ?Admitted From: Home ?Planned disposition location: Home ?Anticipated discharge date: 4/29 pending vascular evaluation, possible long term IV Abx ? ?Family Communication: none  ?  ?Author: ?Richarda Osmond, DO ?[facility] ?05/18/2021, 8:22 AM  ? ?Available by Epic secure chat 7AM-7PM. ?If 7PM-7AM, please contact night-coverage.  ?[facility] contact information found on [email or web address]. ? ?

## 2021-05-18 NOTE — Consult Note (Signed)
NAME: Sharon Gray  ?DOB: 15-Sep-1965  ?MRN: [id number]  ?Date/Time: 05/18/2021 11:24 AM ? ?REQUESTING PROVIDER: Dr. Ether Griffins ?Subjective:  ?REASON FOR CONSULT: Right great toe infection ?? ?Sharon Gray is a 55 y.o. female with a history of ?Diabetes mellitus, hypertension, CVA with right carotid artery stenosis status post ICA stent on Plavix, presented to the ED on 05/17/2021 withPain and swelling of the right great toe.  As per patient she did not know how she got the wound.  She thinks it could be an injury.  But she does not remember.  Over the past 2 weeks she noted some swelling which has gotten worse and for a day there was redness with increasing pain.  She did not have any fever or chills.  So she came to the ED.  Vitals in the ED BP of 161/74, temperature 98.8, heart rate 94, sats 98%.  WBC 10.1, Hb 10.5, platelet 268 and creatinine 1.36.  X-ray of the foot revealed erosion of the tuft of the great toe.  She was started on IV vancomycin and ceftriaxone.  She was seen by podiatrist.  He took a culture.  He recommended amputation of the toe but patient wants to treated conservatively with IV antibiotics. ?I am asked to see the patient for the same.  Patient is a non-smoker. ? ?Past Medical History:  ?Diagnosis Date  ? Diabetes mellitus without complication (HCC)   ? Hypertension   ? Stroke [facility])   ?  ?Past Surgical History:  ?Procedure Laterality Date  ? APPENDECTOMY    ? CAROTID PTA/STENT INTERVENTION Right 07/26/2020  ? Procedure: CAROTID PTA/STENT INTERVENTION;  Surgeon: Renford Dills, MD;  Location: [facility] INVASIVE CV LAB;  Service: Cardiovascular;  Laterality: Right;  ? NO PAST SURGERIES    ?  ?Social History  ? ?Socioeconomic History  ? Marital status: Married  ?  Spouse name: Hazrat  ? Number of children: 4  ? Years of education: Not on file  ? Highest education level: Not on file  ?Occupational History  ? Not on file  ?Tobacco Use  ? Smoking status: Never  ? Smokeless tobacco: Never  ?Substance and Sexual  Activity  ? Alcohol use: Yes  ?  Comment: socially: only at big parties (2 years ago)  ? Drug use: Not Currently  ? Sexual activity: Not on file  ?Other Topics Concern  ? Not on file  ?Social History Narrative  ? Lives with husband and 3 kids at home   ? ?Social Determinants of Health  ? ?Financial Resource Strain: Not on file  ?Food Insecurity: Not on file  ?Transportation Needs: Not on file  ?Physical Activity: Not on file  ?Stress: Not on file  ?Social Connections: Not on file  ?Intimate Partner Violence: Not on file  ?  ?Family History  ?Problem Relation Age of Onset  ? Stroke Father   ? ?No Known Allergies ?I? ?Current Facility-Administered Medications  ?Medication Dose Route Frequency Provider Last Rate Last Admin  ? 0.9 %  sodium chloride infusion   Intravenous Continuous Andris Baumann, MD 100 mL/hr at 05/18/21 0605 New Bag at 05/18/21 0605  ? acetaminophen (TYLENOL) tablet 650 mg  650 mg Oral Q6H PRN Andris Baumann, MD      ? Or  ? acetaminophen (TYLENOL) suppository 650 mg  650 mg Rectal Q6H PRN Andris Baumann, MD      ? Melene Muller ON 05/19/2021] cefTRIAXone (ROCEPHIN) 2 g in sodium chloride 0.9 % 100 mL IVPB  2 g Intravenous Q24H Andris Baumannuncan, Hazel V, MD      ? HYDROcodone-acetaminophen (NORCO/VICODIN) 5-325 MG per tablet 1-2 tablet  1-2 tablet Oral Q4H PRN Andris Baumannuncan, Hazel V, MD      ? insulin aspart (novoLOG) injection 0-15 Units  0-15 Units Subcutaneous Q4H Lindajo Royaluncan, Hazel V, MD      ? metroNIDAZOLE (FLAGYL) IVPB 500 mg  500 mg Intravenous Q8H Andris Baumannuncan, Hazel V, MD   Stopped at 05/18/21 0600  ? morphine (PF) 2 MG/ML injection 2 mg  2 mg Intravenous Q2H PRN Andris Baumannuncan, Hazel V, MD      ? ondansetron [facility](ZOFRAN) tablet 4 mg  4 mg Oral Q6H PRN Andris Baumannuncan, Hazel V, MD      ? Or  ? ondansetron [facility](ZOFRAN) injection 4 mg  4 mg Intravenous Q6H PRN Andris Baumannuncan, Hazel V, MD      ? Melene Muller[START ON 05/19/2021] vancomycin (VANCOREADY) IVPB 750 mg/150 mL  750 mg Intravenous Q24H Andris Baumannuncan, Hazel V, MD      ? ?Current Outpatient Medications  ?Medication Sig  Dispense Refill  ? ACCU-CHEK GUIDE test strip USE TO CHECK BLOOD SUGAR THREE TIMES A DAY    ? acetaminophen (TYLENOL) 500 MG tablet Take 500 mg by mouth every 6 (six) hours as needed for moderate pain or mild pain.    ? Artificial Tear Solution (TEARS NATURALE OP) Place 1 drop into both eyes daily as needed (itchy eye).    ? aspirin EC 81 MG EC tablet Take 1 tablet (81 mg total) by mouth daily. Swallow whole. 90 tablet 3  ? baclofen (LIORESAL) 10 MG tablet Take 10 mg by mouth at bedtime.    ? celecoxib (CELEBREX) 200 MG capsule Take 200 mg by mouth daily.    ? clopidogrel (PLAVIX) 75 MG tablet Take 1 tablet (75 mg total) by mouth daily. 90 tablet 3  ? enalapril (VASOTEC) 2.5 MG tablet Take 2.5 mg by mouth daily.    ? FARXIGA 10 MG TABS tablet Take 10 mg by mouth daily.    ? glimepiride (AMARYL) 4 MG tablet Take 4 mg by mouth 2 (two) times daily.    ? Multiple Vitamin (MULTIVITAMIN) tablet Take 1 tablet by mouth daily.    ? rosuvastatin (CRESTOR) 40 MG tablet Take 40 mg by mouth daily.    ? Vitamin D, Ergocalciferol, (DRISDOL) 1.25 MG (50000 UNIT) CAPS capsule Take 50,000 Units by mouth once a week.    ?  ? ?Abtx:  ?Anti-infectives (From admission, onward)  ? ? Start     Dose/Rate Route Frequency Ordered Stop  ? 05/19/21 0300  vancomycin (VANCOREADY) IVPB 750 mg/150 mL       ? 750 mg ?150 mL/hr over 60 Minutes Intravenous Every 24 hours 05/18/21 0251    ? 05/19/21 0200  cefTRIAXone (ROCEPHIN) 2 g in sodium chloride 0.9 % 100 mL IVPB       ? 2 g ?200 mL/hr over 30 Minutes Intravenous Every 24 hours 05/18/21 0236 05/25/21 0159  ? 05/18/21 0245  metroNIDAZOLE (FLAGYL) IVPB 500 mg       ? 500 mg ?100 mL/hr over 60 Minutes Intravenous Every 8 hours 05/18/21 0236 05/25/21 0244  ? 05/18/21 0130  vancomycin (VANCOREADY) IVPB 1500 mg/300 mL       ? 1,500 mg ?150 mL/hr over 120 Minutes Intravenous  Once 05/18/21 0122 05/18/21 0449  ? 05/18/21 0130  cefTRIAXone (ROCEPHIN) 2 g in sodium chloride 0.9 % 100 mL IVPB       ?  2  g ?200 mL/hr over 30 Minutes Intravenous  Once 05/18/21 0122 05/18/21 0232  ? ?  ? ? ?REVIEW OF SYSTEMS:  ?Const: negative fever, negative chills, negative weight loss ?Eyes: negative diplopia or visual changes, negative eye pain ?ENT: negative coryza, negative sore throat ?Resp: negative cough, hemoptysis, dyspnea ?Cards: negative for chest pain, palpitations, lower extremity edema ?GU: negative for frequency, dysuria and hematuria ?GI: Negative for abdominal pain, diarrhea, bleeding, constipation ?Skin: negative for rash and pruritus ?Heme: negative for easy bruising and gum/nose bleeding ?MS: Patient swelling right great toe ? ? Neurolo: Numbness toes  ? psych: negative for feelings of anxiety, depression  ?Endocrine: , diabetes ?Allergy/Immunology- negative for any medication or food allergies ?? ? ?Objective:  ?VITALS:  ?BP 130/64   Pulse 81   Temp 98.9 ?F (37.2 ?C) (Oral)   Resp 18   Ht 5\' 3"  (1.6 m)   Wt 77.1 kg   SpO2 99%   BMI 30.11 kg/m?  ?PHYSICAL EXAM:  ?General: Alert, cooperative, no distress, appears stated age.  ?Head: Normocephalic, without obvious abnormality, atraumatic. ?Eyes: Conjunctivae clear, anicteric sclerae. Pupils are equal ?ENT Nares normal. No drainage or sinus tenderness. ?Lips, mucosa, and tongue normal. No Thrush ?Neck: Supple, symmetrical, no adenopathy, thyroid: non tender ?no carotid bruit and no JVD. ?Back: No CVA tenderness. ?Lungs: Clear to auscultation bilaterally. No Wheezing or Rhonchi. No rales. ?Heart: Regular rate and rhythm, no murmur, rub or gallop. ?Abdomen: Soft, non-tender,not distended. Bowel sounds normal. No masses ?Extremities: rt great toe swollen, erythematous, ulcer at the tip. ? ? ? ? ?Skin: No rashes or lesions. Or bruising ?Lymph: Cervical, supraclavicular normal. ?Neurologic: Grossly non-focal ?Pertinent Labs ?Lab Results ?CBC ?   ?Component Value Date/Time  ? WBC 10.1 05/17/2021 2242  ? RBC 3.78 (L) 05/17/2021 2242  ? HGB 10.5 (L) 05/17/2021 2242   ? HGB 13.3 01/27/2014 0144  ? HCT 33.2 (L) 05/17/2021 2242  ? HCT 39.3 01/27/2014 0144  ? PLT 268 05/17/2021 2242  ? PLT 283 01/27/2014 0144  ? MCV 87.8 05/17/2021 2242  ? MCV 89 01/27/2014 0144  ? MCH 27.8 04/26/

## 2021-05-18 NOTE — ED Notes (Signed)
Pt resting comfortably in bed; denies any needs currently other than to eat and drink; updated and delay explained; pt understanding.  ?

## 2021-05-18 NOTE — ED Provider Notes (Signed)
? ?[facility] ?Provider Note ? ? ? Event Date/Time  ? First MD Initiated Contact with Patient 05/18/21 0038   ?  (approximate) ? ? ?History  ? ?Foot Swelling ? ? ?HPI ? ?Sharon Gray is a 56 y.o. female past medical history of diabetes, hypertension CVA presents with right toe swelling and pain.  2 weeks ago patient was working in the garden when she thinks she injured the right toe because she noticed some bleeding.  Put Neosporin on it.  Over the last several days she has noticed increasing swelling of the right toe now streaking up the right foot causing pain in the calf.  Denies fevers but has had some chills.  No prior history of diabetic foot infections.  She is not insulin-dependent. ?  ? ?Past Medical History:  ?Diagnosis Date  ? Diabetes mellitus without complication (HCC)   ? Hypertension   ? Stroke [facility])   ? ? ?Patient Active Problem List  ? Diagnosis Date Noted  ? Carotid stenosis, symptomatic, with infarction (HCC) 07/26/2020  ? Carotid artery stenosis, symptomatic, right 06/27/2020  ? Acute CVA (cerebrovascular accident) (HCC) 06/15/2020  ? Type 2 diabetes mellitus (HCC) 06/15/2020  ? Hypertension associated with diabetes (HCC) 06/15/2020  ? ? ? ?Physical Exam  ?Triage Vital Signs: ?ED Triage Vitals  ?Enc Vitals Group  ?   BP 05/17/21 2238 (!) 161/74  ?   Pulse Rate 05/17/21 2238 94  ?   Resp 05/17/21 2238 16  ?   Temp 05/17/21 2238 98.9 ?F (37.2 ?C)  ?   Temp Source 05/17/21 2238 Oral  ?   SpO2 05/17/21 2238 98 %  ?   Weight 05/17/21 2239 170 lb (77.1 kg)  ?   Height 05/17/21 2239 5\' 3"  (1.6 m)  ?   Head Circumference --   ?   Peak Flow --   ?   Pain Score 05/17/21 2238 4  ?   Pain Loc --   ?   Pain Edu? --   ?   Excl. in GC? --   ? ? ?Most recent vital signs: ?Vitals:  ? 05/17/21 2238 05/18/21 0117  ?BP: (!) 161/74 (!) 155/75  ?Pulse: 94 92  ?Resp: 16 20  ?Temp: 98.9 ?F (37.2 ?C)   ?SpO2: 98% 100%  ? ? ? ?General: Awake, no distress.  ?CV:  Good peripheral perfusion.   ?Resp:  Normal effort.  ?Abd:  No distention.  ?Neuro:             Awake, Alert, Oriented x 3  ?Other:  Picture of right foot below, right great toe is significantly swollen with a wound on the distal aspect of the plantar surface with older, erythema tracking up the foot into the ankle ?2+  ? ? ?DP pulse ? ? ?ED Results / Procedures / Treatments  ?Labs ?(all labs ordered are listed, but only abnormal results are displayed) ?Labs Reviewed  ?CBC - Abnormal; Notable for the following components:  ?    Result Value  ? RBC 3.78 (*)   ? Hemoglobin 10.5 (*)   ? HCT 33.2 (*)   ? All other components within normal limits  ?BASIC METABOLIC PANEL - Abnormal; Notable for the following components:  ? Glucose, Bld 171 (*)   ? BUN 39 (*)   ? Creatinine, Ser 1.36 (*)   ? Calcium 8.6 (*)   ? GFR, Estimated 46 (*)   ? All other components within normal limits  ?CULTURE,  BLOOD (ROUTINE X 2)  ?CULTURE, BLOOD (ROUTINE X 2)  ?SEDIMENTATION RATE  ?C-REACTIVE PROTEIN  ? ? ? ?EKG ? ? ? ? ?RADIOLOGY ?I reviewed the x-ray of the right foot which show some cortical destruction consistent with osteomyelitis ? ? ?PROCEDURES: ? ?Critical Care performed: No ? ?Procedures ? ? ? ?MEDICATIONS ORDERED IN ED: ?Medications  ?vancomycin (VANCOREADY) IVPB 1500 mg/300 mL (has no administration in time range)  ?cefTRIAXone (ROCEPHIN) 2 g in sodium chloride 0.9 % 100 mL IVPB (has no administration in time range)  ?sodium chloride 0.9 % bolus 1,000 mL (has no administration in time range)  ? ? ? ?IMPRESSION / MDM / ASSESSMENT AND PLAN / ED COURSE  ?I reviewed the triage vital signs and the nursing notes. ?             ?               ? ?Differential diagnosis includes, but is not limited to, cellulitis, abscess, osteomyelitis ? ?The patient is a 56 year old female with history of diabetes non-insulin-dependent who presents with right toe and foot swelling and pain.  Right toe was likely injured with open wound 2 weeks ago while patient was in the garden.   Has been having increasing swelling and pain since.  On exam the right great toe is significantly swollen there is an open wound with some drainage and odor and there is erythema and warmth of the entire foot.  She is hemodynamically stable no leukocytosis does not appear septic.  Does have mild AKI creatinine 1.36 from baseline around 0.8.  Clinically I am concerned for osteo and on x-ray of the right foot there is some early evidence of possible cortical involvement.  We will cover with ceftriaxone and Vanco give a liter of fluid.  Will admit to the hospitalist service. ? ?  ? ? ?FINAL CLINICAL IMPRESSION(S) / ED DIAGNOSES  ? ?Final diagnoses:  ?Osteomyelitis of right foot, unspecified type (HCC)  ? ? ? ?Rx / DC Orders  ? ?ED Discharge Orders   ? ? None  ? ?  ? ? ? ?Note:  This document was prepared using Dragon voice recognition software and may include unintentional dictation errors. ?  ?Georga Hacking, MD ?05/18/21 0126 ? ?

## 2021-05-18 NOTE — Assessment & Plan Note (Signed)
[facility]

## 2021-05-18 NOTE — Assessment & Plan Note (Signed)
Management as above °

## 2021-05-18 NOTE — Assessment & Plan Note (Signed)
No acute issues.

## 2021-05-18 NOTE — Assessment & Plan Note (Signed)
IV hydration and monitor renal function ?

## 2021-05-18 NOTE — Assessment & Plan Note (Signed)
Zosyn and vancomycin ?Podiatry and vascular consults ?We will keep n.p.o. in case of procedure.  SCD for DVT prophylaxis ?

## 2021-05-18 NOTE — Consult Note (Signed)
?ORTHOPAEDIC CONSULTATION ? ?REQUESTING PHYSICIAN: Richarda Osmond, MD ? ?Chief Complaint: Right great toe infection ? ?HPI: ?Sharon Gray is a 56 y.o. female who complains of  right great toe infection.  Started a couple of weeks ago.  Progressive swelling and pain.  Hx of DMII.  C/o some numbness to foot. ? ?Past Medical History:  ?Diagnosis Date  ? Diabetes mellitus without complication ([place])   ? Hypertension   ? Stroke [facility])   ? ?Past Surgical History:  ?Procedure Laterality Date  ? APPENDECTOMY    ? CAROTID PTA/STENT INTERVENTION Right 07/26/2020  ? Procedure: CAROTID PTA/STENT INTERVENTION;  Surgeon: Katha Cabal, MD;  Location: [facility];  Service: Cardiovascular;  Laterality: Right;  ? NO PAST SURGERIES    ? ?Social History  ? ?Socioeconomic History  ? Marital status: Married  ?  Spouse name: Hazrat  ? Number of children: 4  ? Years of education: Not on file  ? Highest education level: Not on file  ?Occupational History  ? Not on file  ?Tobacco Use  ? Smoking status: Never  ? Smokeless tobacco: Never  ?Substance and Sexual Activity  ? Alcohol use: Yes  ?  Comment: socially: only at big parties (2 years ago)  ? Drug use: Not Currently  ? Sexual activity: Not on file  ?Other Topics Concern  ? Not on file  ?Social History Narrative  ? Lives with husband and 3 kids at home   ? ?Social Determinants of Health  ? ?Financial Resource Strain: Not on file  ?Food Insecurity: Not on file  ?Transportation Needs: Not on file  ?Physical Activity: Not on file  ?Stress: Not on file  ?Social Connections: Not on file  ? ?Family History  ?Problem Relation Age of Onset  ? Stroke Father   ? ?No Known Allergies ?Prior to Admission medications   ?Medication Sig Start Date End Date Taking? Authorizing Provider  ?ACCU-CHEK GUIDE test strip USE TO CHECK BLOOD SUGAR THREE TIMES A DAY 08/07/20  Yes [provider]  ?acetaminophen (TYLENOL) 500 MG tablet Take 500 mg by mouth every 6 (six) hours as needed for  moderate pain or mild pain.   Yes [provider]  ?Artificial Tear Solution (TEARS NATURALE OP) Place 1 drop into both eyes daily as needed (itchy eye).   Yes [provider]  ?aspirin EC 81 MG EC tablet Take 1 tablet (81 mg total) by mouth daily. Swallow whole. 07/27/20  Yes Stegmayer, Janalyn Harder, PA-C  ?baclofen (LIORESAL) 10 MG tablet Take 10 mg by mouth at bedtime. 05/10/21  Yes [provider]  ?celecoxib (CELEBREX) 200 MG capsule Take 200 mg by mouth daily. 04/17/21  Yes [provider]  ?clopidogrel (PLAVIX) 75 MG tablet Take 1 tablet (75 mg total) by mouth daily. 07/27/20 05/18/21 Yes Stegmayer, Joelene Millin A, PA-C  ?enalapril (VASOTEC) 2.5 MG tablet Take 2.5 mg by mouth daily. 08/04/20  Yes [provider]  ?FARXIGA 10 MG TABS tablet Take 10 mg by mouth daily. 08/09/20  Yes [provider]  ?glimepiride (AMARYL) 4 MG tablet Take 4 mg by mouth 2 (two) times daily. 06/09/20  Yes [provider]  ?Multiple Vitamin (MULTIVITAMIN) tablet Take 1 tablet by mouth daily.   Yes [provider]  ?rosuvastatin (CRESTOR) 40 MG tablet Take 40 mg by mouth daily. 06/09/20  Yes [provider]  ?Vitamin D, Ergocalciferol, (DRISDOL) 1.25 MG (50000 UNIT) CAPS capsule Take 50,000 Units by mouth once a week. 05/08/20  Yes [provider]  ? ?DG Foot Complete Right ? ?Result Date: 05/17/2021 ?CLINICAL DATA:  Right foot pain and swelling EXAM: RIGHT FOOT COMPLETE - 3+ VIEW COMPARISON:  None. FINDINGS: Normal alignment. No acute fracture or dislocation. Joint spaces are preserved. Small plantar calcaneal spur. Subtle erosive changes involving the cortex of the distal tuft of the great toe suggests early changes of osteomyelitis. Extensive soft tissue swelling of the great toe. Moderate soft tissue swelling of the right forefoot and midfoot dorsally. IMPRESSION: Extensive soft tissue swelling of the great toe and dorsal right foot. Superimposed erosive  changes involving the distal phalanx of the great toe suggests early changes of osteomyelitis. Electronically Signed   By: Fidela Salisbury M.D.   On: 05/17/2021 23:08   ? ?Positive ROS: All other systems have been reviewed and were otherwise negative with the exception of those mentioned in the HPI and as above.  12 point ROS was performed. ? ?Physical Exam: ?General: Alert and oriented.  No apparent distress. ? ?Vascular: ? Left foot:Dorsalis Pedis:  present ?Posterior Tibial:  present ? Right foot: Dorsalis Pedis:  present ?Posterior Tibial:  present ? ?Neuro:absent protective sensation but gross sensation is intact. ? ?Derm:Left foot without wound. ? ?Right foot with ulcer tip of great toe.  Probes to bone with purulence. ? ?Ortho/MS: Diffuse edema to right great toe  ? ?Assessment: ?Osteomyelitis right great toe ?DM with neuropathy. ? ?Plan: ?I have recommended partial amputation toe great toe.  Pt at this time is resistant to this plan and would like to proceed with IV abx and local wound care for attempts at salvage.  The bone on xray has minimal erosion and may respond to IV abx but d/w pt this is very difficult to clear without removal of the infected bone.  Will consult ID for their opinion.  Wound culture performed today.  Will follow. ? ? ? ?Elesa Hacker, DPM ?Cell [phone] ? ? ?05/18/2021 ?11:03 AM ? ? ?

## 2021-05-18 NOTE — H&P (View-Only) (Signed)
?[facility] ?Vascular Consult Note ? ?MRN : [id number] ? ?Sharon Gray is a 56 y.o. (February 05, 1965) female who presents with chief complaint of  ?Chief Complaint  ?Patient presents with  ? Foot Swelling  ?. ? ? ?Consulting Physician:Sharon Para March, MD ?Reason for consult: Right lower extremity wound with osteomyelitis ?History of Present Illness: Sharon Gray 56 year old female who presents to [facility] in the setting of a right great toe infection.  The patient is known to our practice for previously placed right carotid stent.  She notes that the wound on her right toe started several weeks ago.  She has been having swelling and pain associated with drainage.  She also notes some numbness to her foot.  She has a past medical history of carotid artery stenosis, diabetes mellitus, hypertension, and CVA ? ?Current Facility-Administered Medications  ?Medication Dose Route Frequency Provider Last Rate Last Admin  ? acetaminophen (TYLENOL) tablet 650 mg  650 mg Oral Q6H PRN Sharon Baumann, MD      ? Or  ? acetaminophen (TYLENOL) suppository 650 mg  650 mg Rectal Q6H PRN Sharon Baumann, MD      ? Sharon Gray ON 05/19/2021] cefTRIAXone (ROCEPHIN) 2 g in sodium chloride 0.9 % 100 mL IVPB  2 g Intravenous Q24H Sharon Baumann, MD      ? HYDROcodone-acetaminophen (NORCO/VICODIN) 5-325 MG per tablet 1-2 tablet  1-2 tablet Oral Q4H PRN Sharon Baumann, MD      ? insulin aspart (novoLOG) injection 0-15 Units  0-15 Units Subcutaneous Q4H Sharon Royal V, MD      ? metroNIDAZOLE (FLAGYL) IVPB 500 mg  500 mg Intravenous Q8H Sharon Baumann, MD   Stopped at 05/18/21 1323  ? morphine (PF) 2 MG/ML injection 2 mg  2 mg Intravenous Q2H PRN Sharon Baumann, MD      ? ondansetron [facility]) tablet 4 mg  4 mg Oral Q6H PRN Sharon Baumann, MD      ? Or  ? ondansetron [facility]) injection 4 mg  4 mg Intravenous Q6H PRN Sharon Baumann, MD      ? Sharon Gray ON 05/19/2021] vancomycin (VANCOREADY) IVPB 750 mg/150 mL   750 mg Intravenous Q24H Sharon Baumann, MD      ? ? ?Past Medical History:  ?Diagnosis Date  ? Diabetes mellitus without complication ([facility])   ? Hypertension   ? Stroke [facility])   ? ? ?Past Surgical History:  ?Procedure Laterality Date  ? APPENDECTOMY    ? CAROTID PTA/STENT INTERVENTION Right 07/26/2020  ? Procedure: CAROTID PTA/STENT INTERVENTION;  Surgeon: Sharon Dills, MD;  Location: [facility];  Service: Cardiovascular;  Laterality: Right;  ? NO PAST SURGERIES    ? ? ?Social History ?Social History  ? ?Tobacco Use  ? Smoking status: Never  ? Smokeless tobacco: Never  ?Substance Use Topics  ? Alcohol use: Yes  ?  Comment: socially: only at big parties (2 years ago)  ? Drug use: Not Currently  ? ? ?Family History ?Family History  ?Problem Relation Age of Onset  ? Stroke Father   ? ? ?No Known Allergies ? ? ?REVIEW OF SYSTEMS (Negative unless checked) ? ?Constitutional: [] Weight loss  [] Fever  [] Chills ?Cardiac: [] Chest pain   [] Chest pressure   [] Palpitations   [] Shortness of breath when laying flat   [] Shortness of breath at rest   [] Shortness of breath with exertion. ?Vascular:  [] Pain in legs with walking   []   Pain in legs at rest   [] Pain in legs when laying flat   [] Claudication   [] Pain in feet when walking  [] Pain in feet at rest  [] Pain in feet when laying flat   [] History of DVT   [] Phlebitis   [] Swelling in legs   [] Varicose veins   [x] Non-healing ulcers ?Pulmonary:   [] Uses home oxygen   [] Productive cough   [] Hemoptysis   [] Wheeze  [] COPD   [] Asthma ?Neurologic:  [] Dizziness  [] Blackouts   [] Seizures   [] History of stroke   [] History of TIA  [] Aphasia   [] Temporary blindness   [] Dysphagia   [] Weakness or numbness in arms   [] Weakness or numbness in legs ?Musculoskeletal:  [] Arthritis   [] Joint swelling   [] Joint pain   [] Low back pain ?Hematologic:  [] Easy bruising  [] Easy bleeding   [] Hypercoagulable state   [] Anemic  [] Hepatitis ?Gastrointestinal:  [] Blood in stool   [] Vomiting blood   [] Gastroesophageal reflux/heartburn   [] Difficulty swallowing. ?Genitourinary:  [] Chronic kidney disease   [] Difficult urination  [] Frequent urination  [] Burning with urination   [] Blood in urine ?Skin:  [] Rashes   [] Ulcers   [] Wounds ?Psychological:  [] History of anxiety   []  History of major depression. ? ?Physical Examination ? ?Vitals:  ? 05/18/21 1000 05/18/21 1100 05/18/21 1200 05/18/21 1455  ?BP: 138/65 (!) 145/71 (!) 168/71 140/72  ?Pulse: 77 83 88 85  ?Resp:    16  ?Temp:    98.8 ?F (37.1 ?C)  ?TempSrc:      ?SpO2: 99% 100% 100% 100%  ?Weight:      ?Height:      ? ?Body mass index is 30.11 kg/m?. ?Gen:  WD/WN, NAD ?Head: [place]/AT, No temporalis wasting. Prominent temp pulse not noted. ?Ear/Nose/Throat: Hearing grossly intact, nares w/o erythema or drainage, oropharynx w/o Erythema/Exudate ?Eyes: Sclera non-icteric, conjunctiva clear ?Neck: Trachea midline.  No JVD.  ?Pulmonary:  Good air movement, respirations not labored, equal bilaterally.  ?Cardiac: RRR, normal S1, S2. ?Vascular: No edema noted to leg ?Vessel Right Left  ?PT Palpable Palpable  ?DP Palpable Palpable  ? ?Gastrointestinal: soft, non-tender/non-distended. No guarding/reflex.  ?Musculoskeletal: M/S 5/5 throughout.  Extremities without ischemic changes.  No deformity or atrophy. No edema. ?Neurologic: Sensation grossly intact in extremities.  Symmetrical.  Speech is fluent. Motor exam as listed above. ?Psychiatric: Judgment intact, Mood & affect appropriate for pt's clinical situation. ? ? ? ? ?CBC ?Lab Results  ?Component Value Date  ? WBC 10.1 05/17/2021  ? HGB 10.5 (L) 05/17/2021  ? HCT 33.2 (L) 05/17/2021  ? MCV 87.8 05/17/2021  ? PLT 268 05/17/2021  ? ? ?BMET ?   ?Component Value Date/Time  ? NA 136 05/17/2021 2242  ? NA 137 01/27/2014 0144  ? K 4.1 05/17/2021 2242  ? K 4.1 01/27/2014 0144  ? CL 102 05/17/2021 2242  ? CL 102 01/27/2014 0144  ? CO2 24 05/17/2021 2242  ? CO2 27 01/27/2014 0144  ? GLUCOSE 171 (H) 05/17/2021 2242  ? GLUCOSE 401  (H) 01/27/2014 0144  ? BUN 39 (H) 05/17/2021 2242  ? BUN 18 01/27/2014 0144  ? CREATININE 1.36 (H) 05/17/2021 2242  ? CREATININE 0.93 01/27/2014 0144  ? CALCIUM 8.6 (L) 05/17/2021 2242  ? CALCIUM 8.4 (L) 01/27/2014 0144  ? GFRNONAA 46 (L) 05/17/2021 2242  ? GFRNONAA >60 01/27/2014 0144  ? GFRAA >60 01/27/2014 0144  ? ?Estimated Creatinine Clearance: 46.5 mL/min (A) (by C-G formula based on SCr of 1.36 mg/dL (H)). ? ?COAG ?Lab  Results  ?Component Value Date  ? INR 1.0 06/15/2020  ? ? ?Radiology ?DG Foot Complete Right ? ?Result Date: 05/17/2021 ?CLINICAL DATA:  Right foot pain and swelling EXAM: RIGHT FOOT COMPLETE - 3+ VIEW COMPARISON:  None. FINDINGS: Normal alignment. No acute fracture or dislocation. Joint spaces are preserved. Small plantar calcaneal spur. Subtle erosive changes involving the cortex of the distal tuft of the great toe suggests early changes of osteomyelitis. Extensive soft tissue swelling of the great toe. Moderate soft tissue swelling of the right forefoot and midfoot dorsally. IMPRESSION: Extensive soft tissue swelling of the great toe and dorsal right foot. Superimposed erosive changes involving the distal phalanx of the great toe suggests early changes of osteomyelitis. Electronically Signed   By: Helyn NumbersAshesh  Parikh M.D.   On: 05/17/2021 23:08   ? ? ? ?Assessment/Plan ?1.  Osteomyelitis right great toe ? ? Recommend: ? ?The patient has significant atherosclerotic risk factors including a previous history of significant carotid artery disease, hypertension, and diabetes mellitus.  Given the extensiveness of the wound as well as osteomyelitis, and the desires of the patient to try to salvage her toe, angiogram is recommended for evaluation of circulation as well as possible intervention. ? ?Patient should undergo angiography of the right lower extremity with the hope for intervention for limb salvage.  The risks and benefits as well as the alternative therapies was discussed in detail with the  patient.  All questions were answered.  Patient agrees to proceed with right lower extremity angiography. ? ?I also discussed with patient that despite the angiogram, the patient may still still ultimately ne

## 2021-05-18 NOTE — ED Notes (Signed)
Culture collected by [facility] with podiatry. Sent to lab.  ?

## 2021-05-18 NOTE — ED Notes (Signed)
Pt back to bed and attached to monitor.  ?

## 2021-05-18 NOTE — ED Notes (Signed)
Pt CBG 47 and repeat 45.  States feels a little dizzy but A&Ox4.  Pt given orange juice, graham crackers, and peanut butter.  Provider notified. ?

## 2021-05-18 NOTE — ED Notes (Signed)
Pt's daughter called secretary wanting to speak with pt. Pt given hospital phone and Felicia's phone number since she cannot get her phone to send texts/call currently.  ?

## 2021-05-18 NOTE — ED Notes (Signed)
Pt denies pain but reports toe is uncomfortable and sore. Pt declined offer of pain meds. ?

## 2021-05-18 NOTE — Progress Notes (Signed)
Pharmacy Antibiotic Note ? ?Sharon Gray is a 56 y.o. female admitted on 05/18/2021 with  osteomyelitis .  Pharmacy has been consulted for Vancomycin dosing. ? ?Plan: ?Vancomycin 1500 mg IV X 1 given on 4/27 @ 0238. ?Vancomycin 750 mg IV Q24H ordered to start on 4/28 @ 0300. ? ?AUC = 527 ?Vanc trough = 14.2  ? ?Height: 5\' 3"  (160 cm) ?Weight: 77.1 kg (170 lb) ?IBW/kg (Calculated) : 52.4 ? ?Temp (24hrs), Avg:98.9 ?F (37.2 ?C), Min:98.9 ?F (37.2 ?C), Max:98.9 ?F (37.2 ?C) ? ?Recent Labs  ?Lab 05/17/21 ?2242  ?WBC 10.1  ?CREATININE 1.36*  ?  ?Estimated Creatinine Clearance: 46.5 mL/min (A) (by C-G formula based on SCr of 1.36 mg/dL (H)).   ? ?No Known Allergies ? ?Antimicrobials this admission: ?  >>  ?  >>  ? ?Dose adjustments this admission: ? ? ?Microbiology results: ? BCx:  ? UCx:   ? Sputum:   ? MRSA PCR:  ? ?Thank you for allowing pharmacy to be a part of this patient?s care. ? ?Stacey Sago D ?05/18/2021 2:52 AM ? ?

## 2021-05-19 ENCOUNTER — Encounter (INDEPENDENT_AMBULATORY_CARE_PROVIDER_SITE_OTHER): Payer: Medicaid Other

## 2021-05-19 ENCOUNTER — Ambulatory Visit (INDEPENDENT_AMBULATORY_CARE_PROVIDER_SITE_OTHER): Payer: Medicaid Other | Admitting: Nurse Practitioner

## 2021-05-19 ENCOUNTER — Encounter
Admission: EM | Disposition: A | Discharge status: Home / Self Care | Payer: Self-pay | Source: Home / Self Care | Attending: Student in an Organized Health Care Education/Training Program

## 2021-05-19 DIAGNOSIS — Z95828 Presence of other vascular implants and grafts: Secondary | ICD-10-CM

## 2021-05-19 DIAGNOSIS — Z8673 Personal history of transient ischemic attack (TIA), and cerebral infarction without residual deficits: Secondary | ICD-10-CM | POA: Diagnosis not present

## 2021-05-19 DIAGNOSIS — N179 Acute kidney failure, unspecified: Secondary | ICD-10-CM | POA: Diagnosis not present

## 2021-05-19 DIAGNOSIS — L03115 Cellulitis of right lower limb: Secondary | ICD-10-CM | POA: Diagnosis not present

## 2021-05-19 DIAGNOSIS — I10 Essential (primary) hypertension: Secondary | ICD-10-CM

## 2021-05-19 DIAGNOSIS — M869 Osteomyelitis, unspecified: Secondary | ICD-10-CM | POA: Diagnosis not present

## 2021-05-19 DIAGNOSIS — L97519 Non-pressure chronic ulcer of other part of right foot with unspecified severity: Secondary | ICD-10-CM

## 2021-05-19 DIAGNOSIS — I70235 Atherosclerosis of native arteries of right leg with ulceration of other part of foot: Secondary | ICD-10-CM

## 2021-05-19 HISTORY — PX: LOWER EXTREMITY ANGIOGRAPHY: CATH118251

## 2021-05-19 LAB — BASIC METABOLIC PANEL
Anion gap: 7 (ref 5–15)
BUN: 27 mg/dL — ABNORMAL HIGH (ref 6–20)
CO2: 25 mmol/L (ref 22–32)
Calcium: 8.4 mg/dL — ABNORMAL LOW (ref 8.9–10.3)
Chloride: 105 mmol/L (ref 98–111)
Creatinine, Ser: 0.87 mg/dL (ref 0.44–1.00)
GFR, Estimated: >60 mL/min (ref >60)
Glucose, Bld: 105 mg/dL — ABNORMAL HIGH (ref 70–99)
Potassium: 4 mmol/L (ref 3.5–5.1)
Sodium: 137 mmol/L (ref 135–145)

## 2021-05-19 LAB — GLUCOSE, CAPILLARY
Glucose-Capillary: 112 mg/dL — ABNORMAL HIGH (ref 70–99)
Glucose-Capillary: 148 mg/dL — ABNORMAL HIGH (ref 70–99)
Glucose-Capillary: 157 mg/dL — ABNORMAL HIGH (ref 70–99)
Glucose-Capillary: 163 mg/dL — ABNORMAL HIGH (ref 70–99)
Glucose-Capillary: 206 mg/dL — ABNORMAL HIGH (ref 70–99)
Glucose-Capillary: 61 mg/dL — ABNORMAL LOW (ref 70–99)
Glucose-Capillary: 62 mg/dL — ABNORMAL LOW (ref 70–99)
Glucose-Capillary: 75 mg/dL (ref 70–99)
Glucose-Capillary: 83 mg/dL (ref 70–99)

## 2021-05-19 LAB — CBC
HCT: 32.5 % — ABNORMAL LOW (ref 36.0–46.0)
Hemoglobin: 10.2 g/dL — ABNORMAL LOW (ref 12.0–15.0)
MCH: 27.3 pg (ref 26.0–34.0)
MCHC: 31.4 g/dL (ref 30.0–36.0)
MCV: 86.9 fL (ref 80.0–100.0)
Platelets: 272 10*3/uL (ref 150–400)
RBC: 3.74 MIL/uL — ABNORMAL LOW (ref 3.87–5.11)
RDW: 12.6 % (ref 11.5–15.5)
WBC: 7.5 10*3/uL (ref 4.0–10.5)
nRBC: 0 % (ref 0.0–0.2)

## 2021-05-19 SURGERY — LOWER EXTREMITY ANGIOGRAPHY
Anesthesia: Moderate Sedation | Laterality: Right

## 2021-05-19 MED ORDER — SODIUM CHLORIDE 0.9% FLUSH: 3.0000 mL | INTRAVENOUS | Status: DC | PRN

## 2021-05-19 MED ORDER — MIDAZOLAM HCL 2 MG/ML PO SYRP: 8.0000 mg | ORAL_SOLUTION | Freq: Once | ORAL | Status: DC | PRN

## 2021-05-19 MED ORDER — FENTANYL CITRATE PF 50 MCG/ML IJ SOSY: 12.5000 ug | PREFILLED_SYRINGE | Freq: Once | INTRAMUSCULAR | Status: DC | PRN

## 2021-05-19 MED ORDER — HEPARIN SODIUM (PORCINE) 1000 UNIT/ML IJ SOLN
INTRAMUSCULAR | Status: AC
  Filled 2021-05-19: qty 10

## 2021-05-19 MED ORDER — ZINC SULFATE 220 (50 ZN) MG PO CAPS
220.0000 mg | ORAL_CAPSULE | Freq: Every day | ORAL | Status: DC
  Filled 2021-05-19: qty 1

## 2021-05-19 MED ORDER — MIDAZOLAM HCL 5 MG/5ML IJ SOLN
INTRAMUSCULAR | Status: AC
  Filled 2021-05-19: qty 5

## 2021-05-19 MED ORDER — MORPHINE SULFATE (PF) 4 MG/ML IV SOLN: 2.0000 mg | INTRAVENOUS | Status: DC | PRN

## 2021-05-19 MED ORDER — ACETAMINOPHEN 325 MG PO TABS: 650.0000 mg | ORAL_TABLET | ORAL | Status: DC | PRN

## 2021-05-19 MED ORDER — VANCOMYCIN HCL 1500 MG/300ML IV SOLN
1500.0000 mg | INTRAVENOUS | Status: DC
  Administered 2021-05-20 – 2021-05-22 (×3): 1500 mg via INTRAVENOUS
  Filled 2021-05-19 (×3): qty 300

## 2021-05-19 MED ORDER — SODIUM CHLORIDE 0.9% FLUSH
3.0000 mL | Freq: Two times a day (BID) | INTRAVENOUS | Status: DC
  Administered 2021-05-19: 3 mL via INTRAVENOUS

## 2021-05-19 MED ORDER — ASCORBIC ACID 500 MG PO TABS
500.0000 mg | ORAL_TABLET | Freq: Two times a day (BID) | ORAL | Status: DC
Start: 2021-05-19 — End: 2021-05-20
  Administered 2021-05-19: 500 mg via ORAL
  Filled 2021-05-19 (×2): qty 1

## 2021-05-19 MED ORDER — CEFAZOLIN SODIUM-DEXTROSE 2-4 GM/100ML-% IV SOLN
INTRAVENOUS | Status: AC
  Filled 2021-05-19: qty 100

## 2021-05-19 MED ORDER — IODIXANOL 320 MG/ML IV SOLN
INTRAVENOUS | Status: DC | PRN
  Administered 2021-05-19: 45 mL via INTRA_ARTERIAL

## 2021-05-19 MED ORDER — FENTANYL CITRATE (PF) 100 MCG/2ML IJ SOLN
INTRAMUSCULAR | Status: DC | PRN
  Administered 2021-05-19: 50 ug via INTRAVENOUS

## 2021-05-19 MED ORDER — FAMOTIDINE 20 MG PO TABS: 40.0000 mg | ORAL_TABLET | Freq: Once | ORAL | Status: DC | PRN

## 2021-05-19 MED ORDER — MIDAZOLAM HCL 2 MG/2ML IJ SOLN
INTRAMUSCULAR | Status: DC | PRN
  Administered 2021-05-19: 2 mg via INTRAVENOUS

## 2021-05-19 MED ORDER — DEXTROSE 50 % IV SOLN
12.5000 g | Freq: Once | INTRAVENOUS | Status: AC
  Administered 2021-05-19: 12.5 g via INTRAVENOUS
  Filled 2021-05-19: qty 50

## 2021-05-19 MED ORDER — ONDANSETRON HCL 4 MG/2ML IJ SOLN: 4.0000 mg | Freq: Four times a day (QID) | INTRAMUSCULAR | Status: DC | PRN

## 2021-05-19 MED ORDER — OXYCODONE HCL 5 MG PO TABS: 5.0000 mg | ORAL_TABLET | ORAL | Status: DC | PRN

## 2021-05-19 MED ORDER — CEFAZOLIN SODIUM-DEXTROSE 2-4 GM/100ML-% IV SOLN: 2.0000 g | INTRAVENOUS | Status: DC

## 2021-05-19 MED ORDER — SODIUM CHLORIDE 0.9 % IV SOLN: INTRAVENOUS | Status: DC

## 2021-05-19 MED ORDER — SODIUM CHLORIDE 0.9 % IV SOLN: INTRAVENOUS | Status: AC

## 2021-05-19 MED ORDER — FENTANYL CITRATE PF 50 MCG/ML IJ SOSY
PREFILLED_SYRINGE | INTRAMUSCULAR | Status: AC
  Filled 2021-05-19: qty 2

## 2021-05-19 MED ORDER — SODIUM CHLORIDE 0.9 % IV SOLN: 250.0000 mL | INTRAVENOUS | Status: DC | PRN

## 2021-05-19 MED ORDER — HYDROMORPHONE HCL 1 MG/ML IJ SOLN: 1.0000 mg | Freq: Once | INTRAMUSCULAR | Status: DC | PRN

## 2021-05-19 MED ORDER — METHYLPREDNISOLONE SODIUM SUCC 125 MG IJ SOLR: 125.0000 mg | Freq: Once | INTRAMUSCULAR | Status: DC | PRN

## 2021-05-19 MED ORDER — DEXTROSE-NACL 5-0.9 % IV SOLN: INTRAVENOUS | Status: DC

## 2021-05-19 SURGICAL SUPPLY — 10 items
CANNULA 5F STIFF (CANNULA) ×1 IMPLANT
CATH ANGIO 5F PIGTAIL 65CM (CATHETERS) ×1 IMPLANT
COVER PROBE U/S 5X48 (MISCELLANEOUS) ×1 IMPLANT
DEVICE STARCLOSE SE CLOSURE (Vascular Products) ×1 IMPLANT
GLIDEWIRE ADV .035X260CM (WIRE) ×1 IMPLANT
PACK ANGIOGRAPHY (CUSTOM PROCEDURE TRAY) ×2 IMPLANT
SHEATH BRITE TIP 5FRX11 (SHEATH) ×1 IMPLANT
SYR MEDRAD MARK 7 150ML (SYRINGE) ×1 IMPLANT
TUBING CONTRAST HIGH PRESS 72 (TUBING) ×1 IMPLANT
WIRE GUIDERIGHT .035X150 (WIRE) ×1 IMPLANT

## 2021-05-19 NOTE — Progress Notes (Addendum)
Pharmacy Antibiotic Note ? ?Sharon Gray is a 56 y.o. female admitted on 05/18/2021 with  osteomyelitis .  Pharmacy has been consulted for Vancomycin dosing. ? ?Plan: ?Vancomycin 1500 mg IV X 1 given on 4/27 @ 0238. ?Increase vancomycin from 750mg  q24 to 1500mg  q24 ?Goal AUC 400-550  ?Est AUC: 489.9 ?Est Cmax: 36.8 ?Est Cmin: 10.6 ?Calculated with SCr 0.87 ? ? ?Height: 5\' 3"  (160 cm) ?Weight: 77.1 kg (170 lb) ?IBW/kg (Calculated) : 52.4 ? ?Temp (24hrs), Avg:98.5 ?F (36.9 ?C), Min:98.1 ?F (36.7 ?C), Max:98.8 ?F (37.1 ?C) ? ?Recent Labs  ?Lab 05/17/21 ?2242 05/19/21 ?  ?WBC 10.1 7.5  ?CREATININE 1.36* 0.87  ? ?  ?Estimated Creatinine Clearance: 72.7 mL/min (by C-G formula based on SCr of 0.87 mg/dL).   ? ?No Known Allergies ? ?Antimicrobials this admission: ?4/27 Vancomycin  >>  ?4/27 Flagyl ?4/27 Rocephin ? ?Dose adjustments this admission: ?4/27 Vancomycin 750mg  q24 >> Vancomycin 1.5g q24 ? ?Microbiology results: ? 4/27 BCx:  ? 4/27 Wound cx: ? ? ?Thank you for allowing pharmacy to be a part of this pleasant patient?s care. ? ?5/27, PharmD, MS PGPM ?Clinical Pharmacist ?05/19/2021 ?10:13 AM ? ? ?

## 2021-05-19 NOTE — Op Note (Signed)
[facility] ? Percutaneous Study/Intervention Procedural Note ? ? ?Date of Surgery: 05/19/2021,3:18 PM ? ?Surgeon:Baine Decesare, Latina Craver  ? 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 ?Post-operative diagnosis:  Same ? ?Procedure(s) Performed: ? 1.  Abdominal aortogram ? 2.  Selective injection of the right lower extremity third order catheter placement ? 3.  Ultrasound-guided access to the left common femoral artery ? 4.  StarClose left femoral artery ?  ? ?Anesthesia: Conscious sedation was administered by the interventional radiology RN under my direct supervision. IV Versed plus fentanyl were utilized. Continuous ECG, pulse oximetry and blood pressure was monitored throughout the entire procedure.  Conscious sedation was administered for a total of 13 minutes. ? ?Sheath: 5 French 11 cm Pinnacle sheath retrograde left common femoral ? ?Contrast: 45 cc  ? ?Fluoroscopy Time: 2.3 minutes ? ?Indications:  The patient presents to [facility] with atherosclerotic occlusive disease bilateral lower extremities with ulceration and osteomyelitis of the right great toe.  Pedal pulses are weakly palpable bilaterally suggesting hemodynamically significant atherosclerotic occlusive disease.  The risks and benefits as well as alternative therapies for lower extremity revascularization are reviewed with the patient all questions are answered the patient agrees to proceed.  The patient is therefore undergoing angiography with the hope for intervention for limb salvage. ? ? ?Procedure:  Sharon Gray a 56 y.o. female who was identified and appropriate procedural time out was performed.  The patient was then placed supine on the table and prepped and draped in the usual sterile fashion.  Ultrasound was used to evaluate the left common femoral artery.  It was echolucent and pulsatile indicating it is  patent .  An ultrasound image was acquired for the permanent record.  A micropuncture needle was used to access the left common femoral artery under direct ultrasound guidance.  The microwire was then advanced under fluoroscopic guidance without difficulty followed by the micro-sheath.  A 0.035 J wire was advanced without resistance and a 5Fr sheath was placed.   ? ?Pigtail catheter was then advanced to the level of T12 and AP projection of the aorta was obtained. Pigtail catheter was then repositioned to above the bifurcation and LAO view of the pelvis was obtained. Stiff angled Glidewire and pigtail catheter was then used across the bifurcation and the catheter was positioned in the distal external iliac artery.  RAO of the right groin was then obtained. Wire was reintroduced and negotiated into the SFA and the catheter was advanced into the SFA. Distal runoff was then performed. ? ?After review of the images the catheter was removed over wire and an LAO view of the left groin was obtained. StarClose device was deployed without difficulty. ? ? ?Findings:  ? Aortogram: The abdominal aorta is opacified with a bolus injection contrast.  Single renal arteries are noted bilaterally with normal nephrograms.  No evidence of hemodynamically significant renal artery stenosis.  There are no hemodynamically significant stenoses identified within the aorta.  The aortic bifurcation is mildly diseased but widely patent.  Bilateral common internal and external iliac arteries are free of hemodynamically significant lesions. ? Right lower Extremity: The right common femoral, profunda femoris superficial femoral and popliteal arteries demonstrate mild atherosclerotic changes but there are no hemodynamically significant lesions.  Trifurcation is patent.  There is 3 vessel runoff to the foot, the anterior tibial posterior tibial codominant.  Peroneal is quite small and does not contribute to the forefoot  significantly.  The pedal  arch is intact. ? ?SUMMARY: Based on these images no intervention is performed at this time. ?  ? ?Disposition: Patient was taken to the recovery room in stable condition having tolerated the procedure well. ? ?[name] ?05/19/2021,3:18 PM  ?

## 2021-05-19 NOTE — Progress Notes (Signed)
?PROGRESS NOTE ? ?Sharon Gray    DOB: 1965/09/28, 56 y.o.  ?PIR:[id number]  ?  Code Status: Full Code   ?DOA: 05/18/2021   LOS: 1  ? ?Brief hospital course  ?Sharon Gray is a 56 y.o. female with a PMH significant for DM, HTN, CVA, right carotid artery stenosis s/p ICA stent. ? ?They presented from home to the ED on 05/18/2021 with pain and swelling of right foot x several days. Likely from minor injury in garden a couple weeks ago, per patient. ? ?In the ED, it was found that they had osteomyelitis of right great toe as seen on xray.  ? ?They were treated with IV vancomycin and rocephin. ID, podiatry, and vascular were consulted.  ?Patient was admitted to medicine service for further workup and management of osteomyelitis as outlined in detail below. ? ?05/19/21 -stable ? ?Assessment & Plan  ?Principal Problem: ?  Osteomyelitis of great toe of right foot (HCC) ?Active Problems: ?  Cellulitis of right lower leg ?  AKI (acute kidney injury) (HCC) ?  Type 2 diabetes mellitus (HCC) ?  Hypertension ?  History of CVA (cerebrovascular accident) ?  Internal carotid artery stent present, right ? ?Osteomyelitis of great toe of right foot (HCC)  Cellulitis of right lower leg- stable. Afebrile. Clinically improved.  ?- continue Zosyn and vancomycin, appreciate ID recommendations ?Podiatry and vascular consults ? - angiography today ?- f/u cultures ?  ?AKI (HCC)- resolved ?  ?Internal carotid artery stent present, right ?No acute  issues ?  ?History of CVA- stable ?Continue antiplatelet and statins ?  ?Hypertension- pressures normotensive to mildly increased.  ?- continue to monitor ?- Continue enalapril ?   ?Type 2 diabetes mellitus (HCC)- A1c 7.2 on admission. Better control for improved wound healing. ?- SSI ? ?Body mass index is 30.11 kg/m?. ? ?VTE ppx: SCDs Start: 05/18/21 0235 ? ? ?Diet:  ?   ?Diet  ? Diet NPO time specified  ? Diet NPO time specified  ? ?Consultants: ?ID, vascular, podiatry ?Subjective 05/19/21   ? ?Pt  reports feeling well. She has no pain currently. She is happy to see her toe has improved with treatment when I unwrap the bandage. ?  ?Objective  ? ?Vitals:  ? 05/18/21 2325 05/19/21 0322 05/19/21 0810 05/19/21 1416  ?BP: (!) 123/55 (!) 128/58 (!) 112/57 140/64  ?Pulse: 90 86 76 81  ?Resp: 15 17 17  (!) 21  ?Temp: 98.7 ?F (37.1 ?C) 98.6 ?F (37 ?C) 98.4 ?F (36.9 ?C) 98.4 ?F (36.9 ?C)  ?TempSrc:   Oral Oral  ?SpO2: 100% 99% 100% 100%  ?Weight:      ?Height:      ? ? ?Intake/Output Summary (Last 24 hours) at 05/19/2021 1506 ?Last data filed at 05/19/2021 0407 ?Gross per 24 hour  ?Intake 565.53 ml  ?Output --  ?Net 565.53 ml  ? ?Filed Weights  ? 05/17/21 2239  ?Weight: 77.1 kg  ?  ? ?Physical Exam:  ?General: awake, alert, NAD ?Respiratory: normal respiratory effort. ?Cardiovascular: quick capillary refill, normal S1/S2, RRR, no JVD, murmurs ?Nervous: A&O x3. no gross focal neurologic deficits, normal speech ?Extremities: moves all equally, normal tone ?Skin: erythema and swelling to right foot, greatest on great toe. Normal warmth. Sensation and Rom intact. L foot normal. ?Psychiatry: normal mood, congruent affect ? ?Labs   ?I have personally reviewed the following labs and imaging studies ?CBC ?   ?Component Value Date/Time  ? WBC 7.5 05/19/2021 0508  ? RBC 3.74 (L) 05/19/2021  9371  ? HGB 10.2 (L) 05/19/2021 0508  ? HGB 13.3 01/27/2014 0144  ? HCT 32.5 (L) 05/19/2021 6967  ? HCT 39.3 01/27/2014 0144  ? PLT 272 05/19/2021 0508  ? PLT 283 01/27/2014 0144  ? MCV 86.9 05/19/2021 0508  ? MCV 89 01/27/2014 0144  ? MCH 27.3 05/19/2021 0508  ? MCHC 31.4 05/19/2021 0508  ? RDW 12.6 05/19/2021 0508  ? RDW 12.2 01/27/2014 0144  ? LYMPHSABS 3.4 06/15/2020 1448  ? LYMPHSABS 3.8 (H) 01/27/2014 0144  ? MONOABS 0.8 06/15/2020 1448  ? MONOABS 0.9 01/27/2014 0144  ? EOSABS 0.2 06/15/2020 1448  ? EOSABS 0.4 01/27/2014 0144  ? BASOSABS 0.0 06/15/2020 1448  ? BASOSABS 0.1 01/27/2014 0144  ? ? ?  Latest Ref Rng & Units 05/19/2021  ?  5:08 AM  05/17/2021  ? 10:42 PM 08/29/2020  ?  1:22 PM  ?BMP  ?Glucose 70 - 99 mg/dL 893   810     ?BUN 6 - 20 mg/dL 27   39     ?Creatinine 0.44 - 1.00 mg/dL 1.75   1.02   5.85    ?Sodium 135 - 145 mmol/L 137   136     ?Potassium 3.5 - 5.1 mmol/L 4.0   4.1     ?Chloride 98 - 111 mmol/L 105   102     ?CO2 22 - 32 mmol/L 25   24     ?Calcium 8.9 - 10.3 mg/dL 8.4   8.6     ? ? ?DG Foot Complete Right ? ?Result Date: 05/17/2021 ?CLINICAL DATA:  Right foot pain and swelling EXAM: RIGHT FOOT COMPLETE - 3+ VIEW COMPARISON:  None. FINDINGS: Normal alignment. No acute fracture or dislocation. Joint spaces are preserved. Small plantar calcaneal spur. Subtle erosive changes involving the cortex of the distal tuft of the great toe suggests early changes of osteomyelitis. Extensive soft tissue swelling of the great toe. Moderate soft tissue swelling of the right forefoot and midfoot dorsally. IMPRESSION: Extensive soft tissue swelling of the great toe and dorsal right foot. Superimposed erosive changes involving the distal phalanx of the great toe suggests early changes of osteomyelitis. Electronically Signed   By: Helyn Numbers M.D.   On: 05/17/2021 23:08   ? ?Disposition Plan & Communication  ?Patient status: Inpatient  ?Admitted From: Home ?Planned disposition location: Home ?Anticipated discharge date: 4/29 pending vascular evaluation, possible long term IV Abx ? ?Family Communication: none  ?  ?Author: ?Leeroy Bock, DO ?[facility] ?05/19/2021, 3:06 PM  ? ?Available by Epic secure chat 7AM-7PM. ?If 7PM-7AM, please contact night-coverage.  ?[facility] contact information found on [email or web address]. ? ?

## 2021-05-19 NOTE — Progress Notes (Addendum)
? ?Date of Admission:  05/18/2021    ? ?ID: Sharon Gray is a 56 y.o. female  ?Principal Problem: ?  Osteomyelitis of great toe of right foot (HCC) ?Active Problems: ?  Type 2 diabetes mellitus (HCC) ?  Hypertension ?  History of CVA (cerebrovascular accident) ?  AKI (acute kidney injury) (HCC) ?  Internal carotid artery stent present, right ?  Cellulitis of right lower leg ? ?Family at bed side ? ?Subjective: ?Pt doing better ?Pain and redness better ?Waiting for angio ? ?Medications:  ? insulin aspart  0-15 Units Subcutaneous Q4H  ? multivitamin with minerals  1 tablet Oral Daily  ? nutrition supplement (JUVEN)  1 packet Oral BID BM  ? Ensure Max Protein  11 oz Oral QHS  ? ? ?Objective: ?Vital signs in last 24 hours: ?Temp:  [98.1 ?F (36.7 ?C)-98.8 ?F (37.1 ?C)] 98.4 ?F (36.9 ?C) (04/28 0810) ?Pulse Rate:  [76-90] 76 (04/28 0810) ?Resp:  [15-17] 17 (04/28 0810) ?BP: (112-168)/(55-72) 112/57 (04/28 0810) ?SpO2:  [99 %-100 %] 100 % (04/28 0810) ? ?PHYSICAL EXAM:  ?General: Alert, cooperative, no distress, appears stated age.  ?Head: Normocephalic, without obvious abnormality, atraumatic. ?Eyes: Conjunctivae clear, anicteric sclerae. Pupils are equal ?ENT Nares normal. No drainage or sinus tenderness. ?Lips, mucosa, and tongue normal. No Thrush ?Neck: Supple, symmetrical, no adenopathy, thyroid: non tender ?no carotid bruit and no JVD. ?Back: No CVA tenderness. ?Lungs: Clear to auscultation bilaterally. No Wheezing or Rhonchi. No rales. ?Heart: Regular rate and rhythm, no murmur, rub or gallop. ?Abdomen: Soft, non-tender,not distended. Bowel sounds normal. No masses ?Extremities: rt great toe swollen- small ulcer at the tip ?No erythema ? ?Skin: No rashes or lesions. Or bruising ?Lymph: Cervical, supraclavicular normal. ?Neurologic: Grossly non-focal ? ?Lab Results ?Recent Labs  ?  05/17/21 ?2242 05/19/21 ?8768  ?WBC 10.1 7.5  ?HGB 10.5* 10.2*  ?HCT 33.2* 32.5*  ?NA 136 137  ?K 4.1 4.0  ?CL 102 105  ?CO2 24 25  ?BUN  39* 27*  ?CREATININE 1.36* 0.87  ? ?Liver Panel ?No results for input(s): PROT, ALBUMIN, AST, ALT, ALKPHOS, BILITOT, BILIDIR, IBILI in the last 72 hours. ?Sedimentation Rate ?Recent Labs  ?  05/18/21 ?0147  ?ESRSEDRATE 126*  ? ?C-Reactive Protein ?Recent Labs  ?  05/18/21 ?0147  ?CRP 12.3*  ? ? ?Microbiology: ? ?Studies/Results: ?DG Foot Complete Right ? ?Result Date: 05/17/2021 ?CLINICAL DATA:  Right foot pain and swelling EXAM: RIGHT FOOT COMPLETE - 3+ VIEW COMPARISON:  None. FINDINGS: Normal alignment. No acute fracture or dislocation. Joint spaces are preserved. Small plantar calcaneal spur. Subtle erosive changes involving the cortex of the distal tuft of the great toe suggests early changes of osteomyelitis. Extensive soft tissue swelling of the great toe. Moderate soft tissue swelling of the right forefoot and midfoot dorsally. IMPRESSION: Extensive soft tissue swelling of the great toe and dorsal right foot. Superimposed erosive changes involving the distal phalanx of the great toe suggests early changes of osteomyelitis. Electronically Signed   By: Helyn Numbers M.D.   On: 05/17/2021 23:08   ? ? ?Assessment/Plan: ? ?Diabetic foot infection with wound and osteomyelitis of the rt great toe ?Cultures pending- so far klebsiella oxytoca ?Lab identifying more bacteria ?Currently on vanco/ceftriaxone and flagyl ?Depending on the susceptibility will de-escalate antibiotics- If no MRSA can DC vanco soon ?Pt will go on IV antibiotics on discharge- will need for 4-6 weeks- will decide on final antibiotics once culture finalizes ?Would avoid PO CIPRO  because of  DM and atherosclerosis which increases the risk of adverse effects. ? ? ?DM on insulin ? ?H/o Carotid stenosis RT ICA stent and on plavix and aspirin ? ? Discussed the management with patient and her husband ?ID will follow her peripherally this weekend- call if needed ?

## 2021-05-19 NOTE — Progress Notes (Signed)
Inpatient Diabetes Program Recommendations ? ?AACE/ADA: New Consensus Statement on Inpatient Glycemic Control (2015) ? ?Target Ranges:  Prepandial:   less than 140 mg/dL ?     Peak postprandial:   less than 180 mg/dL (1-2 hours) ?     Critically ill patients:  140 - 180 mg/dL  ? ? Latest Reference Range & Units 05/18/21 04:36 05/18/21 04:41 05/18/21 05:02 05/18/21 05:32 05/18/21 08:07 05/18/21 12:08 05/18/21 12:27 05/18/21 14:32  ?Glucose-Capillary 70 - 99 mg/dL 47 (L) 45 (L) 84 878 (H) 106 (H) 57 (L) 100 (H) 135 (H)  ? ? Latest Reference Range & Units 05/18/21 17:19 05/18/21 20:59 05/19/21 00:53 05/19/21 04:26 05/19/21 08:30  ?Glucose-Capillary 70 - 99 mg/dL 81 676 (H) ? ?5 units Novolog ?@2230  61 (L) 112 (H) 148 (H)  ? ? ?Home DM Meds: Amaryl 4 mg BID ?      Farxiga 10 mg daily ? ?Current Orders: Novolog Moderate Correction Scale/ SSI (0-15 units) Q4 hours ? ? ? ?MD- Note Hypoglycemia since admission.  Got 5 units Novolog SSI at 2230 last night and then dropped to 61 by 1am. ? ?Please consider reducing the Novolog SSI to the 0-9 unit Sensitive scale ? ? ? ?--Will follow patient during hospitalization-- ? ? RN, MSN, CDE ?Diabetes Coordinator ?Inpatient Glycemic Control Team ?Team Pager: [phone] (8a-5p) ? ? ? ? ? ?

## 2021-05-19 NOTE — Progress Notes (Signed)
Patient returned from vascular lab. Fingerstick now at 62 ( probably due to patient receiving insulin on floor). Patient is alert and oriented and states her blood sugar will come up quickly with food and drink. Patient given some orange juice and peanut butter crackers. Will recheck in 30 minutes. If not improved sufficiently will get orders for IV dextrose.  ?

## 2021-05-19 NOTE — Interval H&P Note (Signed)
History and Physical Interval Note: ? ?05/19/2021 ?1:50 PM ? ?Sharon Gray  has presented today for surgery, with the diagnosis of Right lower extremity angiogram with possible intervention.  The various methods of treatment have been discussed with the patient and family. After consideration of risks, benefits and other options for treatment, the patient has consented to  Procedure(s): ?Lower Extremity Angiography (Right) as a surgical intervention.  The patient's history has been reviewed, patient examined, no change in status, stable for surgery.  I have reviewed the patient's chart and labs.  Questions were answered to the patient's satisfaction.   ? ? ?[name] ? ? ?

## 2021-05-19 NOTE — Progress Notes (Signed)
Nutrition Follow-up ? ?DOCUMENTATION CODES:  ? ?Obesity unspecified ? ?INTERVENTION:  ? ?-Once diet resumed: ? ?-1 packet Juven BID, each packet provides 95 calories, 2.5 grams of protein (collagen), and 9.8 grams of carbohydrate (3 grams sugar); also contains 7 grams of L-arginine and L-glutamine, 300 mg vitamin C, 15 mg vitamin E, 1.2 mcg vitamin B-12, 9.5 mg zinc, 200 mg calcium, and 1.5 g  Calcium Beta-hydroxy-Beta-methylbutyrate to support wound healing  ?-Ensure Max po daily, each supplement provides 150 kcal and 30 grams of protein. ?-MVI with minerals daily ?-500 mg vitamin C BID ?-220 mg zinc sulfate daily x 14 days ? ?NUTRITION DIAGNOSIS:  ? ?Increased nutrient needs related to wound healing as evidenced by estimated needs. ? ?Ongoing ? ?GOAL:  ? ?Patient will meet greater than or equal to 90% of their needs ? ?Progressing  ? ?MONITOR:  ? ?PO intake, Supplement acceptance, Labs, Weight trends, Skin, I & O's ? ?REASON FOR ASSESSMENT:  ? ?Consult ?Assessment of nutrition requirement/status, Wound healing ? ?ASSESSMENT:  ? ?Sharon Gray is a 56 y.o. female with medical history significant for DM, HTN, CVA, right carotid artery stenosis s/p ICA stent, who presents to the ED with pain and swelling of the right great toe that started after possible injury to the toe while gardening a couple weeks prior.  Over the past several days she noted worsening of the swelling and noted increased pain, associated with redness to the dorsum of her right foot streaking up the calf. She has no fever or chills. ? ?Reviewed I/O's: +566 ml x 24 hours and +2 L since admission ? ?Pt sleeping soundly at time of visit. She did not awake ot voice or touch.  ? ?Noted pt with good appetite; consumed 100% of dinner meal yesterday.  ? ?Pt is currently NPO for angiogram with vascular surgery today.  ?  ?Medications reviewed and include 0.9% sodium chloride infusion @ 75 ml/hr.  ? ?Labs reviewed: CBGS: 61-148 (inpatient orders for glycemic  control are 0-15 units insulin aspart every 4 hours).   ? ?NUTRITION - FOCUSED PHYSICAL EXAM: ? ?Flowsheet Row Most Recent Value  ?Orbital Region No depletion  ?Upper Arm Region No depletion  ?Thoracic and Lumbar Region No depletion  ?Buccal Region No depletion  ?Temple Region No depletion  ?Clavicle Bone Region No depletion  ?Clavicle and Acromion Bone Region No depletion  ?Scapular Bone Region No depletion  ?Dorsal Hand No depletion  ?Patellar Region No depletion  ?Anterior Thigh Region No depletion  ?Posterior Calf Region No depletion  ?Edema (RD Assessment) None  ?Hair Reviewed  ?Eyes Reviewed  ?Mouth Reviewed  ?Skin Reviewed  ?Nails Reviewed  ? ?  ? ? ?Diet Order:   ?Diet Order   ? ?       ?  Diet NPO time specified  Diet effective midnight       ?  ?  Diet NPO time specified  Diet effective midnight       ?  ? ?  ?  ? ?  ? ? ?EDUCATION NEEDS:  ? ?No education needs have been identified at this time ? ?Skin:  Skin Assessment: Reviewed RN Assessment ? ?Last BM:  Unknown ? ?Height:  ? ?Ht Readings from Last 1 Encounters:  ?05/17/21 5\' 3"  (1.6 m)  ? ? ?Weight:  ? ?Wt Readings from Last 1 Encounters:  ?05/17/21 77.1 kg  ? ? ?Ideal Body Weight:  52.3 kg ? ?BMI:  Body mass index is 30.11 kg/m?05/19/21 ? ?  Estimated Nutritional Needs:  ? ?Kcal:  1800-2000 ? ?Protein:  105-120 grams ? ?Fluid:  > 1.8 L ? ? ? ?Levada Schilling, RD, LDN, CDCES ?Registered Dietitian II ?Certified Diabetes Care and Education Specialist ?Please refer to [facility] for RD and/or RD on-call/weekend/after hours pager  ?

## 2021-05-20 LAB — CBC
HCT: 33 % — ABNORMAL LOW (ref 36.0–46.0)
Hemoglobin: 10.4 g/dL — ABNORMAL LOW (ref 12.0–15.0)
MCH: 27.6 pg (ref 26.0–34.0)
MCHC: 31.5 g/dL (ref 30.0–36.0)
MCV: 87.5 fL (ref 80.0–100.0)
Platelets: 284 10*3/uL (ref 150–400)
RBC: 3.77 MIL/uL — ABNORMAL LOW (ref 3.87–5.11)
RDW: 12.9 % (ref 11.5–15.5)
WBC: 8.2 10*3/uL (ref 4.0–10.5)
nRBC: 0 % (ref 0.0–0.2)

## 2021-05-20 LAB — GLUCOSE, CAPILLARY
Glucose-Capillary: 125 mg/dL — ABNORMAL HIGH (ref 70–99)
Glucose-Capillary: 149 mg/dL — ABNORMAL HIGH (ref 70–99)
Glucose-Capillary: 155 mg/dL — ABNORMAL HIGH (ref 70–99)
Glucose-Capillary: 209 mg/dL — ABNORMAL HIGH (ref 70–99)
Glucose-Capillary: 213 mg/dL — ABNORMAL HIGH (ref 70–99)
Glucose-Capillary: 268 mg/dL — ABNORMAL HIGH (ref 70–99)

## 2021-05-20 LAB — CREATININE, SERUM
Creatinine, Ser: 0.8 mg/dL (ref 0.44–1.00)
GFR, Estimated: >60 mL/min (ref >60)

## 2021-05-20 MED ORDER — INSULIN ASPART 100 UNIT/ML IJ SOLN
0.0000 [IU] | Freq: Three times a day (TID) | INTRAMUSCULAR | Status: DC
  Administered 2021-05-20: 5 [IU] via SUBCUTANEOUS
  Administered 2021-05-20: 2 [IU] via SUBCUTANEOUS
  Administered 2021-05-21: 3 [IU] via SUBCUTANEOUS
  Administered 2021-05-21: 2 [IU] via SUBCUTANEOUS
  Administered 2021-05-21 – 2021-05-22 (×2): 3 [IU] via SUBCUTANEOUS
  Filled 2021-05-20 (×6): qty 1

## 2021-05-20 NOTE — Progress Notes (Signed)
?PROGRESS NOTE ? ?Sharon Gray    DOB: 11-Jun-1965, 56 y.o.  ?ONG:[id number]  ?  Code Status: Full Code   ?DOA: 05/18/2021   LOS: 2  ? ?Brief hospital course  ?Sharon Gray is a 56 y.o. female with a PMH significant for DM, HTN, CVA, right carotid artery stenosis s/p ICA stent. ? ?They presented from home to the ED on 05/18/2021 with pain and swelling of right foot x several days. Likely from minor injury in garden a couple weeks ago, per patient. ? ?In the ED, it was found that they had osteomyelitis of right great toe as seen on xray.  ? ?They were treated with IV vancomycin and rocephin. ID, podiatry, and vascular were consulted.  ?Patient was admitted to medicine service for further workup and management of osteomyelitis as outlined in detail below. ? ?05/20/21 -stable ? ?Assessment & Plan  ?Principal Problem: ?  Osteomyelitis of great toe of right foot (HCC) ?Active Problems: ?  Cellulitis of right lower leg ?  AKI (acute kidney injury) (HCC) ?  Type 2 diabetes mellitus (HCC) ?  Hypertension ?  History of CVA (cerebrovascular accident) ?  Internal carotid artery stent present, right ?  Osteomyelitis of right foot (HCC) ? ?Osteomyelitis of great toe of right foot (HCC)  Cellulitis of right lower leg- stable/improving. Afebrile. I&D performed today.  Blood culture NGTD. Wound culture showing Klebsiella so far, reincubated for better growth ?- continue Zosyn, flagyl and vancomycin and narrow when able based on cultures, appreciate ID recommendations ?Podiatry and vascular consults ? - s/p angiography ?- f/u cultures ?  ?AKI (HCC)- resolved ?  ?Internal carotid artery stent present, right ?No acute  issues ?  ?History of CVA- stable ?Continue antiplatelet and statins ?  ?Hypertension- pressures normotensive to mildly increased.  ?- continue to monitor ?- Continue enalapril ?   ?Type 2 diabetes mellitus (HCC)- A1c 7.2 on admission. Better control for improved wound healing. ?- SSI ? ?Body mass index is 30.11 kg/m?. ? ?VTE  ppx: SCDs Start: 05/18/21 0235 ? ? ?Diet:  ?   ?Diet  ? Diet Carb Modified Fluid consistency: Thin; Room service appropriate? Yes  ? ?Consultants: ?ID, vascular, podiatry ?Subjective 05/20/21   ? ?Pt reports feeling well. She has mild discomfort following I&D ?  ?Objective  ? ?Vitals:  ? 05/19/21 1600 05/19/21 1627 05/19/21 2123 05/20/21 0414  ?BP: 107/61 115/71 (!) 152/71 126/61  ?Pulse: 81 85 85 87  ?Resp: (!) 22 18 18 16   ?Temp:  97.9 ?F (36.6 ?C) 98.3 ?F (36.8 ?C) 98.5 ?F (36.9 ?C)  ?TempSrc:      ?SpO2: 99% 100% 100% 99%  ?Weight:      ?Height:      ? ? ?Intake/Output Summary (Last 24 hours) at 05/20/2021 0809 ?Last data filed at 05/19/2021 1732 ?Gross per 24 hour  ?Intake 100 ml  ?Output --  ?Net 100 ml  ? ? ?Filed Weights  ? 05/17/21 2239  ?Weight: 77.1 kg  ?  ? ?Physical Exam:  ?General: awake, alert, NAD ?Respiratory: normal respiratory effort. ?Cardiovascular: quick capillary refill, normal S1/S2, RRR, no JVD, murmurs ?Nervous: A&O x3. no gross focal neurologic deficits, normal speech ?Extremities: moves all equally, normal tone ?Skin: R great toe bandaged in clean, dry dressing. ROM and sensation intact. Good cap refill ?Psychiatry: normal mood, congruent affect ? ?Labs   ?I have personally reviewed the following labs and imaging studies ?CBC ?   ?Component Value Date/Time  ? WBC 8.2 05/20/2021 0422  ?  RBC 3.77 (L) 05/20/2021 0422  ? HGB 10.4 (L) 05/20/2021 0422  ? HGB 13.3 01/27/2014 0144  ? HCT 33.0 (L) 05/20/2021 0422  ? HCT 39.3 01/27/2014 0144  ? PLT 284 05/20/2021 0422  ? PLT 283 01/27/2014 0144  ? MCV 87.5 05/20/2021 0422  ? MCV 89 01/27/2014 0144  ? MCH 27.6 05/20/2021 0422  ? MCHC 31.5 05/20/2021 0422  ? RDW 12.9 05/20/2021 0422  ? RDW 12.2 01/27/2014 0144  ? LYMPHSABS 3.4 06/15/2020 1448  ? LYMPHSABS 3.8 (H) 01/27/2014 0144  ? MONOABS 0.8 06/15/2020 1448  ? MONOABS 0.9 01/27/2014 0144  ? EOSABS 0.2 06/15/2020 1448  ? EOSABS 0.4 01/27/2014 0144  ? BASOSABS 0.0 06/15/2020 1448  ? BASOSABS 0.1  01/27/2014 0144  ? ? ?  Latest Ref Rng & Units 05/20/2021  ?  4:22 AM 05/19/2021  ?  5:08 AM 05/17/2021  ? 10:42 PM  ?BMP  ?Glucose 70 - 99 mg/dL  389   373    ?BUN 6 - 20 mg/dL  27   39    ?Creatinine 0.44 - 1.00 mg/dL 4.28   7.68   1.15    ?Sodium 135 - 145 mmol/L  137   136    ?Potassium 3.5 - 5.1 mmol/L  4.0   4.1    ?Chloride 98 - 111 mmol/L  105   102    ?CO2 22 - 32 mmol/L  25   24    ?Calcium 8.9 - 10.3 mg/dL  8.4   8.6    ? ? ?PERIPHERAL VASCULAR CATHETERIZATION ? ?Result Date: 05/19/2021 ?See surgical note for result.  ? ?Disposition Plan & Communication  ?Patient status: Inpatient  ?Admitted From: Home ?Planned disposition location: Home ?Anticipated discharge date: 4/30 pending wound cultures and completed Abx regimen selection and set up for home therapy ? ?Family Communication: none  ?  ?Author: ?Leeroy Bock, DO ?[facility] ?05/20/2021, 8:09 AM  ? ?Available by Epic secure chat 7AM-7PM. ?If 7PM-7AM, please contact night-coverage.  ?[facility] contact information found on [email or web address]. ? ?

## 2021-05-20 NOTE — Progress Notes (Signed)
Daily Progress Note ? ? ?Subjective  - 1 Day Post-Op ? ?F/u right great toe osteomyelitis.   ? ?Objective ?Vitals:  ? 05/19/21 1600 05/19/21 1627 05/19/21 2123 05/20/21 0414  ?BP: 107/61 115/71 (!) 152/71 126/61  ?Pulse: 81 85 85 87  ?Resp: (!) 22 18 18 16   ?Temp:  97.9 ?F (36.6 ?C) 98.3 ?F (36.8 ?C) 98.5 ?F (36.9 ?C)  ?TempSrc:      ?SpO2: 99% 100% 100% 99%  ?Weight:      ?Height:      ? ? ?Physical Exam: ?Noted purulence from wound.  Probed wound with sterile suture scissors to release further drainage.  Toe still quite edematous ? ?Laboratory ?CBC ?   ?Component Value Date/Time  ? WBC 8.2 05/20/2021 0422  ? HGB 10.4 (L) 05/20/2021 0422  ? HGB 13.3 01/27/2014 0144  ? HCT 33.0 (L) 05/20/2021 0422  ? HCT 39.3 01/27/2014 0144  ? PLT 284 05/20/2021 0422  ? PLT 283 01/27/2014 0144  ? ? ?BMET ?   ?Component Value Date/Time  ? NA 137 05/19/2021 0508  ? NA 137 01/27/2014 0144  ? K 4.0 05/19/2021 0508  ? K 4.1 01/27/2014 0144  ? CL 105 05/19/2021 0508  ? CL 102 01/27/2014 0144  ? CO2 25 05/19/2021 0508  ? CO2 27 01/27/2014 0144  ? GLUCOSE 105 (H) 05/19/2021 05/21/2021  ? GLUCOSE 401 (H) 01/27/2014 0144  ? BUN 27 (H) 05/19/2021 05/21/2021  ? BUN 18 01/27/2014 0144  ? CREATININE 0.80 05/20/2021 0422  ? CREATININE 0.93 01/27/2014 0144  ? CALCIUM 8.4 (L) 05/19/2021 05/21/2021  ? CALCIUM 8.4 (L) 01/27/2014 0144  ? GFRNONAA >60 05/20/2021 0422  ? GFRNONAA >60 01/27/2014 0144  ? GFRAA >60 01/27/2014 0144  ? ? ?Assessment/Planning: ?Osteomyelitis right great toe  ?DM with neuropathy ? ?Will have pt f/u in outpt clinic in 2 weeks.   ?D/W pt will still possibly need surgery but pt is electing medical management for now. ?Pt can clean daily with soap/water and change dressing with dry gauze daily. ? ? ?03/28/2014 A ? ?05/20/2021, 8:41 AM ? ?

## 2021-05-20 NOTE — Plan of Care (Signed)

## 2021-05-21 LAB — GLUCOSE, CAPILLARY
Glucose-Capillary: 159 mg/dL — ABNORMAL HIGH (ref 70–99)
Glucose-Capillary: 148 mg/dL — ABNORMAL HIGH (ref 70–99)
Glucose-Capillary: 181 mg/dL — ABNORMAL HIGH (ref 70–99)
Glucose-Capillary: 167 mg/dL — ABNORMAL HIGH (ref 70–99)

## 2021-05-21 LAB — CREATININE, SERUM
Creatinine, Ser: 0.79 mg/dL (ref 0.44–1.00)
GFR, Estimated: >60 mL/min (ref >60)

## 2021-05-21 LAB — AEROBIC/ANAEROBIC CULTURE W GRAM STAIN (SURGICAL/DEEP WOUND)

## 2021-05-21 NOTE — Plan of Care (Signed)

## 2021-05-21 NOTE — Progress Notes (Signed)
?PROGRESS NOTE ? ?Sharon Gray    DOB: 24-May-1965, 56 y.o.  ?DDU:[id number]  ?  Code Status: Full Code   ?DOA: 05/18/2021   LOS: 3  ? ?Brief hospital course  ?Sharon Gray is a 56 y.o. female with a PMH significant for DM, HTN, CVA, right carotid artery stenosis s/p ICA stent. ? ?They presented from home to the ED on 05/18/2021 with pain and swelling of right foot x several days. Likely from minor injury in garden a couple weeks ago, per patient. ? ?In the ED, it was found that they had osteomyelitis of right great toe as seen on xray.  ? ?They were treated with IV vancomycin, flagyl and rocephin. ID, podiatry, and vascular were consulted.  ?Patient was admitted to medicine service for further workup and management of osteomyelitis as outlined in detail below. ? ?4/28- angiogram without intervention ? ?05/21/21 -stable ? ?Assessment & Plan  ?Principal Problem: ?  Osteomyelitis of great toe of right foot (HCC) ?Active Problems: ?  Cellulitis of right lower leg ?  AKI (acute kidney injury) (HCC) ?  Type 2 diabetes mellitus (HCC) ?  Hypertension ?  History of CVA (cerebrovascular accident) ?  Internal carotid artery stent present, right ?  Osteomyelitis of right foot (HCC) ? ?Osteomyelitis of great toe of right foot (HCC)  Cellulitis of right lower leg- stable/improving. Afebrile. I&D performed yesterday.  Blood culture NGTD. Wound culture showing Klebsiella so far, reincubated for better growth ?- continue Zosyn, flagyl and vancomycin and narrow when able based on cultures, appreciate ID recommendations ?Podiatry and vascular consults ? - s/p angiography ?- f/u cultures ?  ?AKI (HCC)- resolved ?  ?Internal carotid artery stent present, right ?No acute  issues ?  ?History of CVA- stable ?Continue antiplatelet and statins ?  ?Hypertension- pressures normotensive to mildly increased.  ?- continue to monitor ?- Continue enalapril ?   ?Type 2 diabetes mellitus (HCC)- A1c 7.2 on admission. Better control for improved wound  healing. ?- SSI ? ?Body mass index is 30.11 kg/m?. ? ?VTE ppx:  ?ambulation  ? ?Diet:  ?   ?Diet  ? Diet Carb Modified Fluid consistency: Thin; Room service appropriate? Yes  ? ?Consultants: ?ID, vascular, podiatry ?Subjective 05/21/21   ? ?Pt reports feeling well. Denies any concerns or pain. She is ready to go home when able. ?  ?Objective  ? ?Vitals:  ? 05/20/21 1053 05/20/21 1619 05/20/21 2026 05/21/21 0435  ?BP: (!) 144/74 117/71 134/71 119/64  ?Pulse: 89 91 87 88  ?Resp: 16 16 16 16   ?Temp: 98.5 ?F (36.9 ?C) 98.5 ?F (36.9 ?C) 99.1 ?F (37.3 ?C) 98.3 ?F (36.8 ?C)  ?TempSrc:   Oral Oral  ?SpO2: 100% 100% 100% 100%  ?Weight:      ?Height:      ? ? ?Intake/Output Summary (Last 24 hours) at 05/21/2021 0804 ?Last data filed at 05/21/2021 05/23/2021 ?Gross per 24 hour  ?Intake 1992.45 ml  ?Output 200 ml  ?Net 1792.45 ml  ? ? ?Filed Weights  ? 05/17/21 2239  ?Weight: 77.1 kg  ?  ? ?Physical Exam:  ?General: awake, alert, NAD ?Respiratory: normal respiratory effort. ?Cardiovascular: quick capillary refill, normal S1/S2, RRR, no JVD, murmurs ?Nervous: A&O x3. no gross focal neurologic deficits, normal speech ?Extremities: moves all equally, normal tone ?Skin: R great toe bandaged in clean, dry dressing. ROM and sensation intact. Good cap refill ?Psychiatry: normal mood, congruent affect ? ?Labs   ?I have personally reviewed the following labs and imaging  studies ?CBC ?   ?Component Value Date/Time  ? WBC 8.2 05/20/2021 0422  ? RBC 3.77 (L) 05/20/2021 0422  ? HGB 10.4 (L) 05/20/2021 0422  ? HGB 13.3 01/27/2014 0144  ? HCT 33.0 (L) 05/20/2021 0422  ? HCT 39.3 01/27/2014 0144  ? PLT 284 05/20/2021 0422  ? PLT 283 01/27/2014 0144  ? MCV 87.5 05/20/2021 0422  ? MCV 89 01/27/2014 0144  ? MCH 27.6 05/20/2021 0422  ? MCHC 31.5 05/20/2021 0422  ? RDW 12.9 05/20/2021 0422  ? RDW 12.2 01/27/2014 0144  ? LYMPHSABS 3.4 06/15/2020 1448  ? LYMPHSABS 3.8 (H) 01/27/2014 0144  ? MONOABS 0.8 06/15/2020 1448  ? MONOABS 0.9 01/27/2014 0144  ? EOSABS  0.2 06/15/2020 1448  ? EOSABS 0.4 01/27/2014 0144  ? BASOSABS 0.0 06/15/2020 1448  ? BASOSABS 0.1 01/27/2014 0144  ? ? ?  Latest Ref Rng & Units 05/21/2021  ?  5:07 AM 05/20/2021  ?  4:22 AM 05/19/2021  ?  5:08 AM  ?BMP  ?Glucose 70 - 99 mg/dL   749    ?BUN 6 - 20 mg/dL   27    ?Creatinine 0.44 - 1.00 mg/dL 4.49   6.75   9.16    ?Sodium 135 - 145 mmol/L   137    ?Potassium 3.5 - 5.1 mmol/L   4.0    ?Chloride 98 - 111 mmol/L   105    ?CO2 22 - 32 mmol/L   25    ?Calcium 8.9 - 10.3 mg/dL   8.4    ? ? ?PERIPHERAL VASCULAR CATHETERIZATION ? ?Result Date: 05/19/2021 ?See surgical note for result.  ? ?Disposition Plan & Communication  ?Patient status: Inpatient  ?Admitted From: Home ?Planned disposition location: Home ?Anticipated discharge date: 5/1 pending wound cultures and completed Abx regimen selection and set up for home therapy ? ?Family Communication: none  ?  ?Author: ?Leeroy Bock, DO ?[facility] ?05/21/2021, 8:04 AM  ? ?Available by Epic secure chat 7AM-7PM. ?If 7PM-7AM, please contact night-coverage.  ?[facility] contact information found on [email or web address]. ? ?

## 2021-05-22 ENCOUNTER — Encounter: Payer: Self-pay | Admitting: Vascular Surgery

## 2021-05-22 LAB — BASIC METABOLIC PANEL
Anion gap: 7 (ref 5–15)
BUN: 28 mg/dL — ABNORMAL HIGH (ref 6–20)
CO2: 25 mmol/L (ref 22–32)
Calcium: 8.8 mg/dL — ABNORMAL LOW (ref 8.9–10.3)
Chloride: 107 mmol/L (ref 98–111)
Creatinine, Ser: 0.88 mg/dL (ref 0.44–1.00)
GFR, Estimated: >60 mL/min (ref >60)
Glucose, Bld: 150 mg/dL — ABNORMAL HIGH (ref 70–99)
Potassium: 4.3 mmol/L (ref 3.5–5.1)
Sodium: 139 mmol/L (ref 135–145)

## 2021-05-22 LAB — CBC
HCT: 34.1 % — ABNORMAL LOW (ref 36.0–46.0)
Hemoglobin: 10.7 g/dL — ABNORMAL LOW (ref 12.0–15.0)
MCH: 27.2 pg (ref 26.0–34.0)
MCHC: 31.4 g/dL (ref 30.0–36.0)
MCV: 86.5 fL (ref 80.0–100.0)
Platelets: 334 10*3/uL (ref 150–400)
RBC: 3.94 MIL/uL (ref 3.87–5.11)
RDW: 12.9 % (ref 11.5–15.5)
WBC: 9.2 10*3/uL (ref 4.0–10.5)
nRBC: 0 % (ref 0.0–0.2)

## 2021-05-22 LAB — GLUCOSE, CAPILLARY: Glucose-Capillary: 166 mg/dL — ABNORMAL HIGH (ref 70–99)

## 2021-05-22 MED ORDER — CELECOXIB 200 MG PO CAPS: 200.0000 mg | ORAL_CAPSULE | Freq: Every day | ORAL | Status: DC | PRN

## 2021-05-22 MED ORDER — SULFAMETHOXAZOLE-TRIMETHOPRIM 800-160 MG PO TABS: 1.0000 | ORAL_TABLET | Freq: Two times a day (BID) | ORAL | 0 refills | Status: DC

## 2021-05-22 MED ORDER — BACLOFEN 10 MG PO TABS: 10.0000 mg | ORAL_TABLET | Freq: Every day | ORAL | 0 refills | Status: AC | PRN

## 2021-05-22 NOTE — TOC Progression Note (Signed)
Transition of Care (TOC) - Progression Note  ? ? ?Patient Details  ?Name: Sharon Gray ?MRN: [id number] ?Date of Birth: [birth year] ? ?Transition of Care (TOC) CM/SW Contact  ?Marlowe Sax, RN ?Phone Number: ?05/22/2021, 10:59 AM ? ?Clinical Narrative:    ? ?The patient will DC on Oral Antibiotics ? ? ?  ?  ? ?Expected Discharge Plan and Services ?  ?  ?  ?  ?  ?Expected Discharge Date: 05/22/21               ?  ?  ?  ?  ?  ?  ?  ?  ?  ?  ? ? ?Social Determinants of Health (SDOH) Interventions ?  ? ?Readmission Risk Interventions ?   ? View : No data to display.  ?  ?  ?  ? ? ?

## 2021-05-22 NOTE — Progress Notes (Signed)
? ?  Date of Admission:  05/18/2021    ? ?ID: Sharon Gray is a 56 y.o. female  ?Principal Problem: ?  Osteomyelitis of great toe of right foot (HCC) ?Active Problems: ?  Type 2 diabetes mellitus (HCC) ?  Hypertension ?  History of CVA (cerebrovascular accident) ?  AKI (acute kidney injury) (HCC) ?  Internal carotid artery stent present, right ?  Cellulitis of right lower leg ?  Osteomyelitis of right foot (HCC) ? ?Spoke to patient ? ?Subjective: ?Pt doing better ? ? ?Medications:  ? insulin aspart  0-15 Units Subcutaneous TID WC  ? multivitamin with minerals  1 tablet Oral Daily  ? nutrition supplement (JUVEN)  1 packet Oral BID BM  ? ? ?Objective: ?Vital signs in last 24 hours: ?Temp:  [98 ?F (36.7 ?C)-98.6 ?F (37 ?C)] 98.6 ?F (37 ?C) (05/01 0715) ?Pulse Rate:  [83-89] 89 (05/01 0715) ?Resp:  [16-18] 16 (05/01 0715) ?BP: (120-140)/(71-81) 140/80 (05/01 0715) ?SpO2:  [97 %-100 %] 97 % (05/01 0715) ? ? ?Lab Results ?Recent Labs  ?  05/20/21 ?0422 05/21/21 ?0507 05/22/21 ?0459  ?WBC 8.2  --  9.2  ?HGB 10.4*  --  10.7*  ?HCT 33.0*  --  34.1*  ?NA  --   --  139  ?K  --   --  4.3  ?CL  --   --  107  ?CO2  --   --  25  ?BUN  --   --  28*  ?CREATININE 0.80 0.79 0.88  ? ? ?Microbiology: ?Klebsiella ?MRSA in wound culture ?Studies/Results: ?No results found. ? ? ?Assessment/Plan: ? ?Diabetic foot infection with wound and osteomyelitis of the rt great toe ?Cultures-MRSA and klebsiella ? ?Currently on vanco/ceftriaxone and flagyl ?Pt and her husband would like to go on oral antibiotic and see- Will do bactrim DS 1 BID for 4 weeks- will follow her as OP in 2 weeks ? ? ?DM on insulin ? ?H/o Carotid stenosis RT ICA stent and on plavix and aspirin ? ? Discussed the management with patient and her husband and hospitalist ?I ?

## 2021-05-22 NOTE — Discharge Summary (Signed)
? ? ?Physician Discharge Summary  ?Patient: Sharon Gray ZOX:[id number] DOB: 04/06/1965   Code Status: Full Code ?Admit date: 05/18/2021 ?Discharge date: 05/22/2021 ?Disposition: Home, RN ?PCP: Margaretann LovelessKhan, Neelam S, MD ? ?Recommendations for Outpatient Follow-up:  ?Follow up with ID in about 4 weeks to ensure adequate Abx treatment ?Follow up with podiatry in 2-4 weeks to monitor R great toe progress ? ?Discharge Diagnoses:  ?Principal Problem: ?  Osteomyelitis of great toe of right foot (HCC) ?Active Problems: ?  Cellulitis of right lower leg ?  AKI (acute kidney injury) (HCC) ?  Type 2 diabetes mellitus (HCC) ?  Hypertension ?  History of CVA (cerebrovascular accident) ?  Internal carotid artery stent present, right ?  Osteomyelitis of right foot (HCC) ? ?Brief Hospital Course Summary: ?Sharon Baricole Broce is a 56 y.o. female with a PMH significant for DM, HTN, CVA, right carotid artery stenosis s/p ICA stent. ?  ?They presented from home to the ED on 05/18/2021 with pain and swelling of right foot x several days. Likely from minor injury in garden a couple weeks ago, per patient. ?  ?In the ED, it was found that they had osteomyelitis of right great toe as seen on xray. Their vital signs were stable and had no fever or leukocytosis.  ? ?ID, podiatry, and vascular were consulted.  ? ?They were initially treated with IV vancomycin, flagyl and rocephin which was narrowed as patient did well and had culture information return. .  ? ?4/28- angiogram without intervention ?4/29- I&D performed by podiatry with repeat culture collected.  ? ?5/1- cultures returned from wound with Klebsiella, MRSA, E coli. Susceptibilities to bactrim. Patient preferred oral Abx outpatient therapy. She was started on therapy with outpatient follow up arranged.  ? ?All other chronic conditions were treated with home medications.  ? ?Discharge Condition: Good, improved ?Recommended discharge diet: Regular healthy diet ? ?Consultations: ?ID ?Podiatry ?Vascular  surgery  ? ?Allergies as of 05/22/2021   ?No Known Allergies ?  ? ?  ?Medication List  ?  ? ?TAKE these medications   ? ?Accu-Chek Guide test strip ?Generic drug: glucose blood ?USE TO CHECK BLOOD SUGAR THREE TIMES A DAY ?  ?acetaminophen 500 MG tablet ?Commonly known as: TYLENOL ?Take 500 mg by mouth every 6 (six) hours as needed for moderate pain or mild pain. ?  ?aspirin 81 MG EC tablet ?Take 1 tablet (81 mg total) by mouth daily. Swallow whole. ?  ?baclofen 10 MG tablet ?Commonly known as: LIORESAL ?Take 1 tablet (10 mg total) by mouth daily as needed for muscle spasms. ?What changed:  ?when to take this ?reasons to take this ?  ?celecoxib 200 MG capsule ?Commonly known as: CELEBREX ?Take 1 capsule (200 mg total) by mouth daily as needed. ?What changed:  ?when to take this ?reasons to take this ?  ?clopidogrel 75 MG tablet ?Commonly known as: PLAVIX ?Take 1 tablet (75 mg total) by mouth daily. ?  ?enalapril 2.5 MG tablet ?Commonly known as: VASOTEC ?Take 2.5 mg by mouth daily. ?  ?Farxiga 10 MG Tabs tablet ?Generic drug: dapagliflozin propanediol ?Take 10 mg by mouth daily. ?  ?glimepiride 4 MG tablet ?Commonly known as: AMARYL ?Take 4 mg by mouth 2 (two) times daily. ?  ?multivitamin tablet ?Take 1 tablet by mouth daily. ?  ?rosuvastatin 40 MG tablet ?Commonly known as: CRESTOR ?Take 40 mg by mouth daily. ?  ?sulfamethoxazole-trimethoprim 800-160 MG tablet ?Commonly known as: BACTRIM DS ?Take 1 tablet by mouth 2 (two)  times daily. ?  ?TEARS NATURALE OP ?Place 1 drop into both eyes daily as needed (itchy eye). ?  ?Vitamin D (Ergocalciferol) 1.25 MG (50000 UNIT) Caps capsule ?Commonly known as: DRISDOL ?Take 50,000 Units by mouth once a week. ?  ? ?  ? ? ? ?Subjective   ?Pt reports feeling much improved. Denies pain in her foot. Redness and swelling of leg and foot almost completely gone. Able to ambulate ?Objective  ?Blood pressure 120/71, pulse 84, temperature 98 ?F (36.7 ?C), resp. rate 18, height 5\' 3"  (1.6 m),  weight 77.1 kg, SpO2 100 %.  ? ?General: Pt is alert, awake, not in acute distress ?Cardiovascular: good peripheral perfusion ?Respiratory: CTA bilaterally, no wheezing, no rhonchi ?Abdominal: Soft, NT, ND, bowel sounds + ?Extremities: no edema, no cyanosis. R great toe wrapped in padded gauze bandage. No surrounding erythema. No tenderness. ROM intact. Able to ambulate. Distally cap refill normal. ? ? ?The results of significant diagnostics from this hospitalization (including imaging, microbiology, ancillary and laboratory) are listed below for reference.  ? ?Imaging studies: ?PERIPHERAL VASCULAR CATHETERIZATION ? ?Result Date: 05/19/2021 ?See surgical note for result. ? ?DG Foot Complete Right ? ?Result Date: 05/17/2021 ?CLINICAL DATA:  Right foot pain and swelling EXAM: RIGHT FOOT COMPLETE - 3+ VIEW COMPARISON:  None. FINDINGS: Normal alignment. No acute fracture or dislocation. Joint spaces are preserved. Small plantar calcaneal spur. Subtle erosive changes involving the cortex of the distal tuft of the great toe suggests early changes of osteomyelitis. Extensive soft tissue swelling of the great toe. Moderate soft tissue swelling of the right forefoot and midfoot dorsally. IMPRESSION: Extensive soft tissue swelling of the great toe and dorsal right foot. Superimposed erosive changes involving the distal phalanx of the great toe suggests early changes of osteomyelitis. Electronically Signed   By: 05/19/2021 M.D.   On: 05/17/2021 23:08   ? ?Labs: ?Basic Metabolic Panel: ?Recent Labs  ?Lab 05/17/21 ?2242 05/19/21 ?05/21/21 05/20/21 ?0422 05/21/21 ?0507 05/22/21 ?0459  ?NA 136 137  --   --  139  ?K 4.1 4.0  --   --  4.3  ?CL 102 105  --   --  107  ?CO2 24 25  --   --  25  ?GLUCOSE 171* 105*  --   --  150*  ?BUN 39* 27*  --   --  28*  ?CREATININE 1.36* 0.87 0.80 0.79 0.88  ?CALCIUM 8.6* 8.4*  --   --  8.8*  ? ?CBC: ?Recent Labs  ?Lab 05/17/21 ?2242 05/19/21 ?05/21/21 05/20/21 ?0422 05/22/21 ?0459  ?WBC 10.1 7.5 8.2 9.2   ?HGB 10.5* 10.2* 10.4* 10.7*  ?HCT 33.2* 32.5* 33.0* 34.1*  ?MCV 87.8 86.9 87.5 86.5  ?PLT 268 272 284 334  ? ?Microbiology: ?Results for orders placed or performed during the hospital encounter of 05/18/21  ?Blood culture (routine x 2)     Status: None (Preliminary result)  ? Collection Time: 05/18/21  1:47 AM  ? Specimen: BLOOD  ?Result Value Ref Range Status  ? Specimen Description BLOOD RIGHT ASSIST CONTROL  Final  ? Special Requests   Final  ?  BOTTLES DRAWN AEROBIC AND ANAEROBIC Blood Culture results may not be optimal due to an excessive volume of blood received in culture bottles  ? Culture   Final  ?  NO GROWTH 4 DAYS ?Performed at [facility], [address] [place], [place] ?  ? Report Status PENDING  Incomplete  ?Blood culture (routine x 2)  Status: None (Preliminary result)  ? Collection Time: 05/18/21  1:47 AM  ? Specimen: BLOOD  ?Result Value Ref Range Status  ? Specimen Description BLOOD LEFT ASSIST CONTROL  Final  ? Special Requests   Final  ?  BOTTLES DRAWN AEROBIC AND ANAEROBIC Blood Culture results may not be optimal due to an excessive volume of blood received in culture bottles  ? Culture   Final  ?  NO GROWTH 4 DAYS ?Performed at [facility], [address] [place], [place] [address] ?  ? Report Status PENDING  Incomplete  ?Aerobic/Anaerobic Culture w Gram Stain (surgical/deep wound)     Status: None  ? Collection Time: 05/18/21 11:42 AM  ? Specimen: Toe; Wound  ?Result Value Ref Range Status  ? Specimen Description   Final  ?  TOE RIGHT ?Performed at [facility], [address] [place], [place] [address] ?  ? Special Requests   Final  ?  NONE ?Performed at [facility], [address], [place], [place] [address] ?  ? Gram Stain   Final  ?  FEW WBC PRESENT, PREDOMINANTLY PMN ?FEW GRAM POSITIVE COCCI ?FEW GRAM NEGATIVE RODS ?  ? Culture   Final  ?  FEW KLEBSIELLA OXYTOCA ?FEW METHICILLIN RESISTANT STAPHYLOCOCCUS AUREUS ?FEW ESCHERICHIA  COLI ?NO ANAEROBES ISOLATED ?Performed at [facility], [address], [place], [place] [address] ?  ? Report Status 05/21/2021 FINAL  Final  ? Organism ID, Bacteria KLEBSIELLA OXYTOCA  Final  ? O

## 2021-05-22 NOTE — Discharge Instructions (Signed)
You can regularly shower and dress your toe with a bandaid or can wrap in gauze if you need more cushion to provide protection when active. I recommend using topical mupirocin on the wound.  ?Please take bactrim twice per day and follow up with podiatry and infectious disease within the next 2-4 weeks.  ?Return to ED if having increased pain/redness/pus drainage and fever ? ?You can resume your other home medications ?

## 2021-05-23 LAB — CULTURE, BLOOD (ROUTINE X 2)
Culture: NO GROWTH
Culture: NO GROWTH

## 2021-05-25 ENCOUNTER — Other Ambulatory Visit: Payer: Self-pay | Admitting: Internal Medicine

## 2021-05-25 DIAGNOSIS — R1011 Right upper quadrant pain: Secondary | ICD-10-CM

## 2021-06-05 ENCOUNTER — Other Ambulatory Visit (INDEPENDENT_AMBULATORY_CARE_PROVIDER_SITE_OTHER): Payer: Self-pay | Admitting: Vascular Surgery

## 2021-06-05 DIAGNOSIS — I70239 Atherosclerosis of native arteries of right leg with ulceration of unspecified site: Secondary | ICD-10-CM

## 2021-06-06 ENCOUNTER — Ambulatory Visit: Payer: Medicaid Other | Attending: Infectious Diseases | Admitting: Infectious Diseases

## 2021-06-06 ENCOUNTER — Encounter: Payer: Self-pay | Admitting: Infectious Diseases

## 2021-06-06 VITALS — BP 136/67 | HR 86 | Temp 96.7°F | Ht 63.0 in | Wt 170.0 lb

## 2021-06-06 DIAGNOSIS — I10 Essential (primary) hypertension: Secondary | ICD-10-CM | POA: Insufficient documentation

## 2021-06-06 DIAGNOSIS — L089 Local infection of the skin and subcutaneous tissue, unspecified: Secondary | ICD-10-CM | POA: Insufficient documentation

## 2021-06-06 DIAGNOSIS — M869 Osteomyelitis, unspecified: Secondary | ICD-10-CM | POA: Insufficient documentation

## 2021-06-06 DIAGNOSIS — Z79899 Other long term (current) drug therapy: Secondary | ICD-10-CM | POA: Diagnosis not present

## 2021-06-06 DIAGNOSIS — Z955 Presence of coronary angioplasty implant and graft: Secondary | ICD-10-CM | POA: Diagnosis not present

## 2021-06-06 DIAGNOSIS — E11628 Type 2 diabetes mellitus with other skin complications: Secondary | ICD-10-CM | POA: Diagnosis not present

## 2021-06-06 DIAGNOSIS — Z8673 Personal history of transient ischemic attack (TIA), and cerebral infarction without residual deficits: Secondary | ICD-10-CM | POA: Diagnosis not present

## 2021-06-06 DIAGNOSIS — I6521 Occlusion and stenosis of right carotid artery: Secondary | ICD-10-CM | POA: Diagnosis not present

## 2021-06-06 DIAGNOSIS — Z7984 Long term (current) use of oral hypoglycemic drugs: Secondary | ICD-10-CM

## 2021-06-06 MED ORDER — SULFAMETHOXAZOLE-TRIMETHOPRIM 800-160 MG PO TABS: 1.0000 | ORAL_TABLET | Freq: Two times a day (BID) | ORAL | 0 refills | Status: DC

## 2021-06-06 NOTE — Progress Notes (Signed)
NAME: Sharon Gray  ?DOB: 02/23/1965  ?MRN: [id number]  ?Date/Time: 06/06/2021 9:14 AM ? ? ?Subjective:  ?Sharon Gray is a 56 y.o. with a history of Diabetes mellitus, hypertension, CVA with right carotid artery stenosis status post ICA stent on Plavix, ?Patient is here with her husband.  She was recently hospitalized between 05/18/2021 until 05/22/2021 for right great toe infection. ?There was osteomyelitis seen on the x-ray.  She did not have any fever or leukocytosis.  She had an angio done and that was normal with good circulation ?On 05/20/2021 I&D was performed by podiatry and cultures were collected.  It was positive for Klebsiella MRSA and E. coli susceptible to Bactrim.  As patient and husband preferred oral antibiotics she was sent on Bactrim double strength 1 twice daily for 4 weeks.  She is here for follow-up visit.  But according to her husband she is only getting 1 tablet once a day of Bactrim.  The husband states the pharmacist labeled it as 1 tablet a day.  Called [facility] and they said it was twice a day. ?Patient is doing better ?Says his pain in the toe is much improved ?Swelling is also better she says ? ?Past Medical History:  ?Diagnosis Date  ? Diabetes mellitus without complication (HCC)   ? Hypertension   ? Stroke [facility](HCC)   ?  ?Past Surgical History:  ?Procedure Laterality Date  ? APPENDECTOMY    ? CAROTID PTA/STENT INTERVENTION Right 07/26/2020  ? Procedure: CAROTID PTA/STENT INTERVENTION;  Surgeon: Renford DillsSchnier, Gregory G, MD;  Location: [facility] INVASIVE CV LAB;  Service: Cardiovascular;  Laterality: Right;  ? LOWER EXTREMITY ANGIOGRAPHY Right 05/19/2021  ? Procedure: Lower Extremity Angiography;  Surgeon: Renford DillsSchnier, Gregory G, MD;  Location: [facility] INVASIVE CV LAB;  Service: Cardiovascular;  Laterality: Right;  ? NO PAST SURGERIES    ?  ?Social History  ? ?Socioeconomic History  ? Marital status: Married  ?  Spouse name: Hazrat  ? Number of children: 4  ? Years of education: Not on file  ? Highest education level: Not  on file  ?Occupational History  ? Not on file  ?Tobacco Use  ? Smoking status: Never  ?  Passive exposure: Never  ? Smokeless tobacco: Never  ?Substance and Sexual Activity  ? Alcohol use: Yes  ?  Comment: socially: only at big parties (2 years ago)  ? Drug use: Not Currently  ? Sexual activity: Not on file  ?Other Topics Concern  ? Not on file  ?Social History Narrative  ? Lives with husband and 3 kids at home   ? ?Social Determinants of Health  ? ?Financial Resource Strain: Not on file  ?Food Insecurity: Not on file  ?Transportation Needs: Not on file  ?Physical Activity: Not on file  ?Stress: Not on file  ?Social Connections: Not on file  ?Intimate Partner Violence: Not on file  ?  ?Family History  ?Problem Relation Age of Onset  ? Stroke Father   ? ?No Known Allergies ?I? ?Current Outpatient Medications  ?Medication Sig Dispense Refill  ? ACCU-CHEK GUIDE test strip USE TO CHECK BLOOD SUGAR THREE TIMES A DAY    ? acetaminophen (TYLENOL) 500 MG tablet Take 500 mg by mouth every 6 (six) hours as needed for moderate pain or mild pain.    ? Artificial Tear Solution (TEARS NATURALE OP) Place 1 drop into both eyes daily as needed (itchy eye).    ? aspirin EC 81 MG EC tablet Take 1 tablet (81 mg total) by  mouth daily. Swallow whole. 90 tablet 3  ? baclofen (LIORESAL) 10 MG tablet Take 1 tablet (10 mg total) by mouth daily as needed for muscle spasms. 30 each 0  ? celecoxib (CELEBREX) 200 MG capsule Take 1 capsule (200 mg total) by mouth daily as needed.    ? enalapril (VASOTEC) 2.5 MG tablet Take 2.5 mg by mouth daily.    ? FARXIGA 10 MG TABS tablet Take 10 mg by mouth daily.    ? glimepiride (AMARYL) 4 MG tablet Take 4 mg by mouth 2 (two) times daily.    ? Multiple Vitamin (MULTIVITAMIN) tablet Take 1 tablet by mouth daily.    ? rosuvastatin (CRESTOR) 40 MG tablet Take 40 mg by mouth daily.    ? sulfamethoxazole-trimethoprim (BACTRIM DS) 800-160 MG tablet Take 1 tablet by mouth 2 (two) times daily. 60 tablet 0  ?  Vitamin D, Ergocalciferol, (DRISDOL) 1.25 MG (50000 UNIT) CAPS capsule Take 50,000 Units by mouth once a week.    ? clopidogrel (PLAVIX) 75 MG tablet Take 1 tablet (75 mg total) by mouth daily. 90 tablet 3  ? ?No current facility-administered medications for this visit.  ?  ? ?Abtx:  ?Anti-infectives (From admission, onward)  ? ? None  ? ?  ? ? ?REVIEW OF SYSTEMS:  ?Const: negative fever, negative chills, negative weight loss ?Eyes: poor eyesight, ? Glaucoma- getting injections ?ENT: negative coryza, negative sore throat ?Resp: negative cough, hemoptysis, dyspnea ?Cards: negative for chest pain, palpitations, lower extremity edema ?GU: negative for frequency, dysuria and hematuria ?GI: Negative for abdominal pain, diarrhea, bleeding, constipation ?Skin: negative for rash and pruritus ?Heme: negative for easy bruising and gum/nose bleeding ?MS: negative for myalgias, arthralgias, back pain and muscle weakness ?Neurolo:negative for headaches, dizziness, vertigo, memory problems  ?Psych: negative for feelings of anxiety, depression  ?Endocrine: diabetes ?Allergy/Immunology- negative for any medication or food allergies ? ?Objective:  ?VITALS:  ?BP 136/67   Pulse 86   Temp (!) 96.7 ?F (35.9 ?C) (Temporal)   Ht 5\' 3"  (1.6 m)   Wt 170 lb (77.1 kg)   BMI 30.11 kg/m?  ? ?PHYSICAL EXAM:  ?General: Alert, cooperative, no distress, appears stated age.  ?Head: Normocephalic, without obvious abnormality, atraumatic. ?Eyes: did not examine- has dark glasses ?ENT Nares normal. No drainage or sinus tenderness. ?Lips, mucosa, and tongue normal. No Thrush ?Neck: Supple, symmetrical, no adenopathy, thyroid: non tender ?no carotid bruit and no JVD. ?Back: No CVA tenderness. ?Lungs: Clear to auscultation bilaterally. No Wheezing or Rhonchi. No rales. ?Heart: Regular rate and rhythm, no murmur, rub or gallop. ?Abdomen: not examined ?Extremities:  ? ? ? ? ?Rt great toe - swelling better ? ?Cns non  focal ? ?Impression/recommendation ?Diabetic toe infection with wound and osteomyelitis of the right great toe.  Culture had MRSA, Klebsiella and E. coli all sensitive to Bactrim ?Patient was sent home on Bactrim double strength 1 twice daily but has been taking only once a day due to some miscommunication.  Husband insisted that pharmacy had given that dose. ?He went home and checked the bottle and it was twice daily on the label ?Asked the patient to increase it to twice daily and get labs in 2 weeks. The wound is actually looking better.  The swelling is less ? ?Diabetes mellitus on Farxiga and glimepiride ?Carotid stenosis right ICA stent on Plavix and aspirin. ? ?Discussed the management with the patient and her husband.  Follow-up 4 weeks. ? ? ? ? ?Follow-up appointment with Dr.  Ether Griffins made for next week. ? ? ? ? ?atraumatic, no cyanosis. No edema. No clubbing ?Skin: No rashes or lesions. Or bruising ?Lymph: Cervical, supraclavicular normal. ?Neurologic: Grossly non-focal ?Pertinent Labs ?Lab Results ?CBC ?   ?Component Value Date/Time  ? WBC 9.2 05/22/2021 0459  ? RBC 3.94 05/22/2021 0459  ? HGB 10.7 (L) 05/22/2021 0459  ? HGB 13.3 01/27/2014 0144  ? HCT 34.1 (L) 05/22/2021 0459  ? HCT 39.3 01/27/2014 0144  ? PLT 334 05/22/2021 0459  ? PLT 283 01/27/2014 0144  ? MCV 86.5 05/22/2021 0459  ? MCV 89 01/27/2014 0144  ? MCH 27.2 05/22/2021 0459  ? MCHC 31.4 05/22/2021 0459  ? RDW 12.9 05/22/2021 0459  ? RDW 12.2 01/27/2014 0144  ? LYMPHSABS 3.4 06/15/2020 1448  ? LYMPHSABS 3.8 (H) 01/27/2014 0144  ? MONOABS 0.8 06/15/2020 1448  ? MONOABS 0.9 01/27/2014 0144  ? EOSABS 0.2 06/15/2020 1448  ? EOSABS 0.4 01/27/2014 0144  ? BASOSABS 0.0 06/15/2020 1448  ? BASOSABS 0.1 01/27/2014 0144  ? ? ? ?  Latest Ref Rng & Units 05/22/2021  ?  4:59 AM 05/21/2021  ?  5:07 AM 05/20/2021  ?  4:22 AM  ?CMP  ?Glucose 70 - 99 mg/dL 409      ?BUN 6 - 20 mg/dL 28      ?Creatinine 0.44 - 1.00 mg/dL 7.35   3.29   9.24    ?Sodium 135 - 145 mmol/L  139      ?Potassium 3.5 - 5.1 mmol/L 4.3      ?Chloride 98 - 111 mmol/L 107      ?CO2 22 - 32 mmol/L 25      ?Calcium 8.9 - 10.3 mg/dL 8.8      ? ? ? ? ?Microbiology: ?No results found for this or any previous visit (from the past 240 hour(s)). ?

## 2021-06-06 NOTE — Patient Instructions (Addendum)
You are here for infection of the rt great toe- on discharge you were given trimethoprim/sulfa DC 1 tablet twice a day- you have been taking only once a day. Please start taking it twice a day.I sent another month of prescription ? do labs in 2 weeks ?May 23rd 2.30pm with Dr. Ether Griffins. ? ?

## 2021-06-07 ENCOUNTER — Ambulatory Visit (INDEPENDENT_AMBULATORY_CARE_PROVIDER_SITE_OTHER): Payer: Medicaid Other | Admitting: Nurse Practitioner

## 2021-06-07 ENCOUNTER — Encounter (INDEPENDENT_AMBULATORY_CARE_PROVIDER_SITE_OTHER): Payer: Medicaid Other

## 2021-06-07 ENCOUNTER — Telehealth (INDEPENDENT_AMBULATORY_CARE_PROVIDER_SITE_OTHER): Payer: Self-pay | Admitting: Nurse Practitioner

## 2021-06-07 NOTE — Telephone Encounter (Signed)
LVM for pt TCB and R/S appt due to [place] tech being out. Please R/S [place] and appt.  ?

## 2021-06-09 ENCOUNTER — Ambulatory Visit (INDEPENDENT_AMBULATORY_CARE_PROVIDER_SITE_OTHER): Payer: Medicaid Other

## 2021-06-09 ENCOUNTER — Encounter (INDEPENDENT_AMBULATORY_CARE_PROVIDER_SITE_OTHER): Payer: Self-pay | Admitting: Nurse Practitioner

## 2021-06-09 ENCOUNTER — Ambulatory Visit (INDEPENDENT_AMBULATORY_CARE_PROVIDER_SITE_OTHER): Payer: Medicaid Other | Admitting: Nurse Practitioner

## 2021-06-09 ENCOUNTER — Other Ambulatory Visit (INDEPENDENT_AMBULATORY_CARE_PROVIDER_SITE_OTHER): Payer: Self-pay | Admitting: Vascular Surgery

## 2021-06-09 VITALS — BP 124/64 | HR 76 | Resp 18 | Ht 63.0 in | Wt 171.0 lb

## 2021-06-09 DIAGNOSIS — I6521 Occlusion and stenosis of right carotid artery: Secondary | ICD-10-CM | POA: Diagnosis not present

## 2021-06-09 DIAGNOSIS — I2510 Atherosclerotic heart disease of native coronary artery without angina pectoris: Secondary | ICD-10-CM

## 2021-06-09 DIAGNOSIS — E1159 Type 2 diabetes mellitus with other circulatory complications: Secondary | ICD-10-CM | POA: Diagnosis not present

## 2021-06-09 DIAGNOSIS — I152 Hypertension secondary to endocrine disorders: Secondary | ICD-10-CM | POA: Diagnosis not present

## 2021-06-09 DIAGNOSIS — I70239 Atherosclerosis of native arteries of right leg with ulceration of unspecified site: Secondary | ICD-10-CM

## 2021-06-09 MED ORDER — CLOPIDOGREL BISULFATE 75 MG PO TABS: 75.0000 mg | ORAL_TABLET | Freq: Every day | ORAL | 3 refills | Status: DC

## 2021-06-19 ENCOUNTER — Encounter (INDEPENDENT_AMBULATORY_CARE_PROVIDER_SITE_OTHER): Payer: Self-pay | Admitting: Nurse Practitioner

## 2021-06-19 NOTE — Progress Notes (Signed)
Subjective:    Patient ID: Sharon Gray, female    DOB: 07-Dec-1965, 56 y.o.   MRN: [id number] No chief complaint on file.   Sharon Gray is a 56 year old female that is seen for follow up evaluation of carotid stenosis status post right  carotid stent on 07/26/2020.  There were no post operative problems or complications related to the surgery.  The patient denies neck or incisional pain.   The patient denies interval amaurosis fugax. There is no recent history of TIA symptoms or focal motor deficits. There is no prior documented CVA.   The patient denies headache.   The patient is taking enteric-coated aspirin 81 mg daily.   The patient has a history of coronary artery disease, no recent episodes of angina or shortness of breath. The patient denies PAD or claudication symptoms.  She recently underwent angiogram due to slow healing ulceration while hospitalized and this was found to be open and patent. There is a history of hyperlipidemia which is being treated with a statin.     Today noninvasive studies shows no evidence of stenosis in the right ICA.  The carotid stent is widely patent.  The left ICA has a 1 to 39% stenosis.  Velocities in the left ICA are less than previous studies done on 08/18/2020.   Review of Systems  All other systems reviewed and are negative.     Objective:   Physical Exam Vitals reviewed.  HENT:     Head: Normocephalic.  Cardiovascular:     Rate and Rhythm: Normal rate.  Pulmonary:     Effort: Pulmonary effort is normal.  Skin:    General: Skin is warm and dry.  Neurological:     Mental Status: She is alert and oriented to person, place, and time.  Psychiatric:        Mood and Affect: Mood normal.        Behavior: Behavior normal.        Thought Content: Thought content normal.        Judgment: Judgment normal.    BP 124/64 (BP Location: Left Arm)   Pulse 76   Resp 18   Ht 5\' 3"  (1.6 m)   Wt 171 lb (77.6 kg)   BMI 30.29 kg/m   Past  Medical History:  Diagnosis Date   Diabetes mellitus without complication (HCC)    Hypertension    Stroke [facility])     Social History   Socioeconomic History   Marital status: Married    Spouse name: Barrister's clerk   Number of children: 4   Years of education: Not on file   Highest education level: Not on file  Occupational History   Not on file  Tobacco Use   Smoking status: Never    Passive exposure: Never   Smokeless tobacco: Never  Substance and Sexual Activity   Alcohol use: Yes    Comment: socially: only at big parties (2 years ago)   Drug use: Not Currently   Sexual activity: Not on file  Other Topics Concern   Not on file  Social History Narrative   Lives with husband and 3 kids at home    Social Determinants of Health   Financial Resource Strain: Not on file  Food Insecurity: Not on file  Transportation Needs: Not on file  Physical Activity: Not on file  Stress: Not on file  Social Connections: Not on file  Intimate Partner Violence: Not on file    Past Surgical History:  Procedure Laterality Date   APPENDECTOMY     CAROTID PTA/STENT INTERVENTION Right 07/26/2020   Procedure: CAROTID PTA/STENT INTERVENTION;  Surgeon: Katha Cabal, MD;  Location: [place] CV LAB;  Service: Cardiovascular;  Laterality: Right;   LOWER EXTREMITY ANGIOGRAPHY Right 05/19/2021   Procedure: Lower Extremity Angiography;  Surgeon: Katha Cabal, MD;  Location: [place] CV LAB;  Service: Cardiovascular;  Laterality: Right;   NO PAST SURGERIES      Family History  Problem Relation Age of Onset   Stroke Father     No Known Allergies     Latest Ref Rng & Units 05/22/2021    4:59 AM 05/20/2021    4:22 AM 05/19/2021    5:08 AM  CBC  WBC 4.0 - 10.5 K/uL 9.2   8.2   7.5    Hemoglobin 12.0 - 15.0 g/dL 10.7   10.4   10.2    Hematocrit 36.0 - 46.0 % 34.1   33.0   32.5    Platelets 150 - 400 K/uL 334   284   272        CMP     Component Value Date/Time   NA 139  05/22/2021 0459   NA 137 01/27/2014 0144   K 4.3 05/22/2021 0459   K 4.1 01/27/2014 0144   CL 107 05/22/2021 0459   CL 102 01/27/2014 0144   CO2 25 05/22/2021 0459   CO2 27 01/27/2014 0144   GLUCOSE 150 (H) 05/22/2021 0459   GLUCOSE 401 (H) 01/27/2014 0144   BUN 28 (H) 05/22/2021 0459   BUN 18 01/27/2014 0144   CREATININE 0.88 05/22/2021 0459   CREATININE 0.93 01/27/2014 0144   CALCIUM 8.8 (L) 05/22/2021 0459   CALCIUM 8.4 (L) 01/27/2014 0144   PROT 8.4 (H) 06/15/2020 1448   PROT 8.1 01/27/2014 0144   ALBUMIN 3.7 06/15/2020 1448   ALBUMIN 3.7 01/27/2014 0144   AST 23 06/15/2020 1448   AST 14 (L) 01/27/2014 0144   ALT 31 06/15/2020 1448   ALT 27 01/27/2014 0144   ALKPHOS 119 06/15/2020 1448   ALKPHOS 140 (H) 01/27/2014 0144   BILITOT 0.4 06/15/2020 1448   BILITOT 0.2 01/27/2014 0144   GFRNONAA >60 05/22/2021 0459   GFRNONAA >60 01/27/2014 0144   GFRAA >60 01/27/2014 0144     No results found.     Assessment & Plan:   1. Carotid artery stenosis, symptomatic, right Recommend:  Given the patient's asymptomatic subcritical stenosis no further invasive testing or surgery at this time.  Duplex ultrasound shows <40% stenosis bilaterally.  Continue antiplatelet therapy as prescribed Continue management of CAD, HTN and Hyperlipidemia Healthy heart diet,  encouraged exercise at least 4 times per week Follow up in 12 months with duplex ultrasound and physical exam    2. Type 2 diabetes mellitus with other circulatory complication, unspecified whether long term insulin use (HCC) Continue hypoglycemic medications as already ordered, these medications have been reviewed and there are no changes at this time.  Hgb A1C to be monitored as already arranged by primary service   3. Hypertension associated with diabetes ([place]) Continue antihypertensive medications as already ordered, these medications have been reviewed and there are no changes at this time.    Current Outpatient  Medications on File Prior to Visit  Medication Sig Dispense Refill   ACCU-CHEK GUIDE test strip USE TO CHECK BLOOD SUGAR THREE TIMES A DAY     Artificial Tear Solution (TEARS NATURALE OP) Place 1 drop  into both eyes daily as needed (itchy eye).     aspirin EC 81 MG EC tablet Take 1 tablet (81 mg total) by mouth daily. Swallow whole. 90 tablet 3   baclofen (LIORESAL) 10 MG tablet Take 1 tablet (10 mg total) by mouth daily as needed for muscle spasms. 30 each 0   celecoxib (CELEBREX) 200 MG capsule Take 1 capsule (200 mg total) by mouth daily as needed.     enalapril (VASOTEC) 2.5 MG tablet Take 2.5 mg by mouth daily.     glimepiride (AMARYL) 4 MG tablet Take 4 mg by mouth 2 (two) times daily.     Multiple Vitamin (MULTIVITAMIN) tablet Take 1 tablet by mouth daily.     rosuvastatin (CRESTOR) 40 MG tablet Take 40 mg by mouth daily.     sulfamethoxazole-trimethoprim (BACTRIM DS) 800-160 MG tablet Take 1 tablet by mouth 2 (two) times daily. 60 tablet 0   Vitamin D, Ergocalciferol, (DRISDOL) 1.25 MG (50000 UNIT) CAPS capsule Take 50,000 Units by mouth once a week.     acetaminophen (TYLENOL) 500 MG tablet Take 500 mg by mouth every 6 (six) hours as needed for moderate pain or mild pain. (Patient not taking: Reported on 06/09/2021)     FARXIGA 10 MG TABS tablet Take 10 mg by mouth daily. (Patient not taking: Reported on 06/09/2021)     No current facility-administered medications on file prior to visit.    There are no Patient Instructions on file for this visit. No follow-ups on file.   Kris Hartmann, NP

## 2021-06-23 ENCOUNTER — Other Ambulatory Visit: Payer: Medicaid Other

## 2021-07-04 ENCOUNTER — Ambulatory Visit: Payer: Medicaid Other | Attending: Infectious Diseases | Admitting: Infectious Diseases

## 2021-07-04 ENCOUNTER — Encounter: Payer: Self-pay | Admitting: Infectious Diseases

## 2021-07-04 ENCOUNTER — Other Ambulatory Visit
Admission: RE | Admit: 2021-07-04 | Discharge: 2021-07-04 | Disposition: A | Discharge status: Home / Self Care | Payer: Medicaid Other | Attending: Infectious Diseases | Admitting: Infectious Diseases

## 2021-07-04 VITALS — BP 123/75 | HR 82 | Temp 97.2°F | Ht 63.0 in | Wt 172.0 lb

## 2021-07-04 DIAGNOSIS — M86171 Other acute osteomyelitis, right ankle and foot: Secondary | ICD-10-CM

## 2021-07-04 DIAGNOSIS — E1169 Type 2 diabetes mellitus with other specified complication: Secondary | ICD-10-CM

## 2021-07-04 DIAGNOSIS — L089 Local infection of the skin and subcutaneous tissue, unspecified: Secondary | ICD-10-CM | POA: Insufficient documentation

## 2021-07-04 DIAGNOSIS — Z7982 Long term (current) use of aspirin: Secondary | ICD-10-CM | POA: Diagnosis not present

## 2021-07-04 DIAGNOSIS — I6521 Occlusion and stenosis of right carotid artery: Secondary | ICD-10-CM | POA: Insufficient documentation

## 2021-07-04 DIAGNOSIS — Z95828 Presence of other vascular implants and grafts: Secondary | ICD-10-CM | POA: Diagnosis not present

## 2021-07-04 DIAGNOSIS — Z7984 Long term (current) use of oral hypoglycemic drugs: Secondary | ICD-10-CM | POA: Insufficient documentation

## 2021-07-04 DIAGNOSIS — Z79899 Other long term (current) drug therapy: Secondary | ICD-10-CM | POA: Diagnosis not present

## 2021-07-04 DIAGNOSIS — Z7902 Long term (current) use of antithrombotics/antiplatelets: Secondary | ICD-10-CM | POA: Insufficient documentation

## 2021-07-04 DIAGNOSIS — M869 Osteomyelitis, unspecified: Secondary | ICD-10-CM | POA: Insufficient documentation

## 2021-07-04 LAB — COMPREHENSIVE METABOLIC PANEL
ALT: 71 U/L — ABNORMAL HIGH (ref 0–44)
AST: 68 U/L — ABNORMAL HIGH (ref 15–41)
Albumin: 3.8 g/dL (ref 3.5–5.0)
Alkaline Phosphatase: 127 U/L — ABNORMAL HIGH (ref 38–126)
Anion gap: 7 (ref 5–15)
BUN: 45 mg/dL — ABNORMAL HIGH (ref 6–20)
CO2: 22 mmol/L (ref 22–32)
Calcium: 8.8 mg/dL — ABNORMAL LOW (ref 8.9–10.3)
Chloride: 108 mmol/L (ref 98–111)
Creatinine, Ser: 0.99 mg/dL (ref 0.44–1.00)
GFR, Estimated: >60 mL/min (ref >60)
Glucose, Bld: 153 mg/dL — ABNORMAL HIGH (ref 70–99)
Potassium: 4.4 mmol/L (ref 3.5–5.1)
Sodium: 137 mmol/L (ref 135–145)
Total Bilirubin: 0.6 mg/dL (ref 0.3–1.2)
Total Protein: 8.2 g/dL — ABNORMAL HIGH (ref 6.5–8.1)

## 2021-07-04 LAB — CBC WITH DIFFERENTIAL/PLATELET
Abs Immature Granulocytes: 0.01 10*3/uL (ref 0.00–0.07)
Basophils Absolute: 0 10*3/uL (ref 0.0–0.1)
Basophils Relative: 1 %
Eosinophils Absolute: 0.4 10*3/uL (ref 0.0–0.5)
Eosinophils Relative: 6 %
HCT: 37 % (ref 36.0–46.0)
Hemoglobin: 11.5 g/dL — ABNORMAL LOW (ref 12.0–15.0)
Immature Granulocytes: 0 %
Lymphocytes Relative: 27 %
Lymphs Abs: 1.7 10*3/uL (ref 0.7–4.0)
MCH: 27.3 pg (ref 26.0–34.0)
MCHC: 31.1 g/dL (ref 30.0–36.0)
MCV: 87.7 fL (ref 80.0–100.0)
Monocytes Absolute: 0.5 10*3/uL (ref 0.1–1.0)
Monocytes Relative: 8 %
Neutro Abs: 3.7 10*3/uL (ref 1.7–7.7)
Neutrophils Relative %: 58 %
Platelets: 239 10*3/uL (ref 150–400)
RBC: 4.22 MIL/uL (ref 3.87–5.11)
RDW: 14.5 % (ref 11.5–15.5)
WBC: 6.4 10*3/uL (ref 4.0–10.5)
nRBC: 0 % (ref 0.0–0.2)

## 2021-07-04 LAB — C-REACTIVE PROTEIN: CRP: <0.5 mg/dL (ref <1.0)

## 2021-07-04 LAB — SEDIMENTATION RATE: Sed Rate: 52 mm/hr — ABNORMAL HIGH (ref 0–30)

## 2021-07-04 MED ORDER — SULFAMETHOXAZOLE-TRIMETHOPRIM 800-160 MG PO TABS
1.0000 | ORAL_TABLET | Freq: Two times a day (BID) | ORAL | 0 refills | Status: AC
Start: 2021-07-04 — End: 2021-08-03

## 2021-07-04 NOTE — Progress Notes (Signed)
NAME: Sharon Gray  DOB: [birth year]  MRN: [id number]  Date/Time: 07/04/2021 9:24 AM   Subjective:  Sharon Gray is a 56 y.o. with a history of Diabetes mellitus, hypertension, CVA with right carotid artery stenosis status post ICA stent on Plavix, Patient is here with her husband.  For follow up Rt great toe infection- doing very well No pain in the toe, wound is now covered with scab Has a lot of energy Eating health Taking bactrim DS 1 BID every day Says blood sugar is better controlled  Following taken from note before She was  hospitalized between 05/18/2021 until 05/22/2021 for right great toe infection. There was osteomyelitis seen on the x-ray.  She did not have any fever or leukocytosis.  She had an angio done and that was normal with good circulation On 05/20/2021 I&D was performed by podiatry and cultures were collected.  It was positive for Klebsiella MRSA and E. coli susceptible to Bactrim.  As patient and husband preferred oral antibiotics she was sent on Bactrim double strength 1 twice daily for 4 weeks.  She initially was taking 1 tablet once a day instead of 2, and on 06/06/21 she started with 2 a day when I saw her and asked her to change the dose and take it correctly     Past Medical History:  Diagnosis Date   Diabetes mellitus without complication ([place])    Hypertension    Stroke [facility])     Past Surgical History:  Procedure Laterality Date   APPENDECTOMY     CAROTID PTA/STENT INTERVENTION Right 07/26/2020   Procedure: CAROTID PTA/STENT INTERVENTION;  Surgeon: Katha Cabal, MD;  Location: [place] CV LAB;  Service: Cardiovascular;  Laterality: Right;   LOWER EXTREMITY ANGIOGRAPHY Right 05/19/2021   Procedure: Lower Extremity Angiography;  Surgeon: Katha Cabal, MD;  Location: [place] CV LAB;  Service: Cardiovascular;  Laterality: Right;   NO PAST SURGERIES      Social History   Socioeconomic History   Marital status: Married    Spouse name: Barrister's clerk    Number of children: 4   Years of education: Not on file   Highest education level: Not on file  Occupational History   Not on file  Tobacco Use   Smoking status: Never    Passive exposure: Never   Smokeless tobacco: Never  Substance and Sexual Activity   Alcohol use: Yes    Comment: socially: only at big parties (2 years ago)   Drug use: Not Currently   Sexual activity: Not on file  Other Topics Concern   Not on file  Social History Narrative   Lives with husband and 3 kids at home    Social Determinants of Health   Financial Resource Strain: Not on file  Food Insecurity: Not on file  Transportation Needs: Not on file  Physical Activity: Not on file  Stress: Not on file  Social Connections: Not on file  Intimate Partner Violence: Not on file    Family History  Problem Relation Age of Onset   Stroke Father    No Known Allergies I? Current Outpatient Medications  Medication Sig Dispense Refill   ACCU-CHEK GUIDE test strip USE TO CHECK BLOOD SUGAR THREE TIMES A DAY     acetaminophen (TYLENOL) 500 MG tablet Take 500 mg by mouth every 6 (six) hours as needed for moderate pain or mild pain.     Artificial Tear Solution (TEARS NATURALE OP) Place 1 drop into both eyes daily as  needed (itchy eye).     aspirin EC 81 MG EC tablet Take 1 tablet (81 mg total) by mouth daily. Swallow whole. 90 tablet 3   baclofen (LIORESAL) 10 MG tablet Take 1 tablet (10 mg total) by mouth daily as needed for muscle spasms. 30 each 0   celecoxib (CELEBREX) 200 MG capsule Take 1 capsule (200 mg total) by mouth daily as needed.     clopidogrel (PLAVIX) 75 MG tablet Take 1 tablet (75 mg total) by mouth daily. 90 tablet 3   enalapril (VASOTEC) 2.5 MG tablet Take 2.5 mg by mouth daily.     FARXIGA 10 MG TABS tablet Take 10 mg by mouth daily.     glimepiride (AMARYL) 4 MG tablet Take 4 mg by mouth 2 (two) times daily.     Multiple Vitamin (MULTIVITAMIN) tablet Take 1 tablet by mouth daily.      rosuvastatin (CRESTOR) 40 MG tablet Take 40 mg by mouth daily.     sulfamethoxazole-trimethoprim (BACTRIM DS) 800-160 MG tablet Take 1 tablet by mouth 2 (two) times daily. 60 tablet 0   Vitamin D, Ergocalciferol, (DRISDOL) 1.25 MG (50000 UNIT) CAPS capsule Take 50,000 Units by mouth once a week.     No current facility-administered medications for this visit.     Abtx:  Anti-infectives (From admission, onward)    None       REVIEW OF SYSTEMS:  Const: negative fever, negative chills, negative weight loss Eyes: poor eyesight, ? Glaucoma- getting injections ENT: negative coryza, negative sore throat Resp: negative cough, hemoptysis, dyspnea Cards: negative for chest pain, palpitations, lower extremity edema GU: negative for frequency, dysuria and hematuria GI: Negative for abdominal pain, diarrhea, bleeding, constipation Skin: negative for rash and pruritus Heme: negative for easy bruising and gum/nose bleeding MS: negative for myalgias, arthralgias, back pain and muscle weakness Neurolo:negative for headaches, dizziness, vertigo, memory problems  Psych: negative for feelings of anxiety, depression  Endocrine: diabetes Allergy/Immunology- negative for any medication or food allergies  Objective:  VITALS:  BP 123/75   Pulse 82   Temp (!) 97.2 F (36.2 C) (Temporal)   Ht _0  (1.6 m)   Wt 172 lb (78 kg)   BMI 30.47 kg/m   PHYSICAL EXAM:  General: Alert, cooperative, no distress, appears stated age.  Head: Normocephalic, without obvious abnormality, atraumatic. Eyes: did not examine- has dark glasses ENT Nares normal. No drainage or sinus tenderness. Lips, mucosa, and tongue normal. No Thrush Neck: Supple, symmetrical, no adenopathy, thyroid: non tender no carotid bruit and no JVD. Back: No CVA tenderness. Lungs: Clear to auscultation bilaterally. No Wheezing or Rhonchi. No rales. Heart: Regular rate and rhythm, no murmur, rub or gallop. Abdomen: not  examined Extremities:  Rt toe- smaller, no erythema Small scab at the tip    May 18, 2021   Rt great toe - swelling better  Cns non focal  Impression/recommendation Diabetic toe infection with wound and osteomyelitis of the right great toe.  Culture had MRSA, Klebsiella and E. coli all sensitive to Bactrim On bactrim since 5/1 but correct dose only from 5/16 Wound and infection much improved Continue bactrim for 1 more month  Diabetes mellitus on Farxiga and glimepiride  Carotid stenosis right ICA stent on Plavix and aspirin.  Discussed the management with the patient and her husband.  Follow-up 4 weeks.   Follow-up appointment with Dr. Vickki Muff today.  ESR, CRP, CMP, CBC today ? ? ? ___________________________________________________ Discussed with patient, and her husband in  detail  P.S CRP < 0.5 ( was 12.3 on 05/18/21) ESR 52 ( was 126 on 05/18/21) Note:  This document was prepared using Dragon voice recognition software and may include unintentional dictation errors.

## 2021-07-04 NOTE — Patient Instructions (Addendum)
You are here for follow up of the rt great toe infection- you are doing better        Continue antibiotic- Will do labs today- follow up 1 month

## 2021-07-05 ENCOUNTER — Telehealth: Payer: Self-pay

## 2021-07-05 NOTE — Telephone Encounter (Signed)
-----  Message from Tsosie Billing, MD sent at 07/05/2021  7:31 AM EDT ----- Regarding: FW: Can you please let her know that the   inflammatory markers ESR and CRP are much better indicative of improving toe infection. She has very mildly elevated liver enzymes AST and ALT which we will repeat in 2 weeks. Could be due to medications ( combination of antibiotic and cholesterol medicine) thx ----- Message ----- From: Interface, Lab In [place] Sent: 07/04/2021  10:12 AM EDT To: Tsosie Billing, MD

## 2021-07-05 NOTE — Telephone Encounter (Signed)
Called patient at both mobile and home phone numbers to communicate results message from Dr. Rivka Safer. No answer. Left HIPAA-compliant message on mobile voicemail requesting call back. Will also send MyChart message.  Wyvonne Lenz, RN

## 2021-08-08 ENCOUNTER — Ambulatory Visit: Payer: Medicaid Other | Attending: Infectious Diseases | Admitting: Infectious Diseases

## 2021-08-08 ENCOUNTER — Other Ambulatory Visit
Admission: RE | Admit: 2021-08-08 | Discharge: 2021-08-08 | Disposition: A | Discharge status: Home / Self Care | Payer: Medicaid Other | Source: Ambulatory Visit | Attending: Infectious Diseases | Admitting: Infectious Diseases

## 2021-08-08 ENCOUNTER — Encounter: Payer: Self-pay | Admitting: Infectious Diseases

## 2021-08-08 VITALS — BP 143/67 | HR 69 | Temp 97.0°F | Ht 63.0 in | Wt 173.0 lb

## 2021-08-08 DIAGNOSIS — L0889 Other specified local infections of the skin and subcutaneous tissue: Secondary | ICD-10-CM | POA: Insufficient documentation

## 2021-08-08 DIAGNOSIS — Z7984 Long term (current) use of oral hypoglycemic drugs: Secondary | ICD-10-CM | POA: Insufficient documentation

## 2021-08-08 DIAGNOSIS — R7989 Other specified abnormal findings of blood chemistry: Secondary | ICD-10-CM | POA: Insufficient documentation

## 2021-08-08 DIAGNOSIS — A4902 Methicillin resistant Staphylococcus aureus infection, unspecified site: Secondary | ICD-10-CM

## 2021-08-08 DIAGNOSIS — I10 Essential (primary) hypertension: Secondary | ICD-10-CM | POA: Diagnosis not present

## 2021-08-08 DIAGNOSIS — L089 Local infection of the skin and subcutaneous tissue, unspecified: Secondary | ICD-10-CM

## 2021-08-08 DIAGNOSIS — M86171 Other acute osteomyelitis, right ankle and foot: Secondary | ICD-10-CM | POA: Diagnosis not present

## 2021-08-08 DIAGNOSIS — E1169 Type 2 diabetes mellitus with other specified complication: Secondary | ICD-10-CM

## 2021-08-08 DIAGNOSIS — I6529 Occlusion and stenosis of unspecified carotid artery: Secondary | ICD-10-CM | POA: Insufficient documentation

## 2021-08-08 DIAGNOSIS — Z79899 Other long term (current) drug therapy: Secondary | ICD-10-CM | POA: Insufficient documentation

## 2021-08-08 DIAGNOSIS — Z8673 Personal history of transient ischemic attack (TIA), and cerebral infarction without residual deficits: Secondary | ICD-10-CM | POA: Diagnosis not present

## 2021-08-08 DIAGNOSIS — E11628 Type 2 diabetes mellitus with other skin complications: Secondary | ICD-10-CM | POA: Diagnosis not present

## 2021-08-08 DIAGNOSIS — B961 Klebsiella pneumoniae [K. pneumoniae] as the cause of diseases classified elsewhere: Secondary | ICD-10-CM

## 2021-08-08 DIAGNOSIS — B9620 Unspecified Escherichia coli [E. coli] as the cause of diseases classified elsewhere: Secondary | ICD-10-CM

## 2021-08-08 DIAGNOSIS — E119 Type 2 diabetes mellitus without complications: Secondary | ICD-10-CM | POA: Diagnosis not present

## 2021-08-08 LAB — CBC WITH DIFFERENTIAL/PLATELET
Abs Immature Granulocytes: 0.01 10*3/uL (ref 0.00–0.07)
Basophils Absolute: 0 10*3/uL (ref 0.0–0.1)
Basophils Relative: 1 %
Eosinophils Absolute: 0.3 10*3/uL (ref 0.0–0.5)
Eosinophils Relative: 6 %
HCT: 37.1 % (ref 36.0–46.0)
Hemoglobin: 11.7 g/dL — ABNORMAL LOW (ref 12.0–15.0)
Immature Granulocytes: 0 %
Lymphocytes Relative: 41 %
Lymphs Abs: 2.2 10*3/uL (ref 0.7–4.0)
MCH: 28.1 pg (ref 26.0–34.0)
MCHC: 31.5 g/dL (ref 30.0–36.0)
MCV: 89 fL (ref 80.0–100.0)
Monocytes Absolute: 0.5 10*3/uL (ref 0.1–1.0)
Monocytes Relative: 8 %
Neutro Abs: 2.5 10*3/uL (ref 1.7–7.7)
Neutrophils Relative %: 44 %
Platelets: 225 10*3/uL (ref 150–400)
RBC: 4.17 MIL/uL (ref 3.87–5.11)
RDW: 14.5 % (ref 11.5–15.5)
WBC: 5.5 10*3/uL (ref 4.0–10.5)
nRBC: 0 % (ref 0.0–0.2)

## 2021-08-08 LAB — COMPREHENSIVE METABOLIC PANEL
ALT: 60 U/L — ABNORMAL HIGH (ref 0–44)
AST: 50 U/L — ABNORMAL HIGH (ref 15–41)
Albumin: 3.7 g/dL (ref 3.5–5.0)
Alkaline Phosphatase: 163 U/L — ABNORMAL HIGH (ref 38–126)
Anion gap: 3 — ABNORMAL LOW (ref 5–15)
BUN: 36 mg/dL — ABNORMAL HIGH (ref 6–20)
CO2: 26 mmol/L (ref 22–32)
Calcium: 8.9 mg/dL (ref 8.9–10.3)
Chloride: 111 mmol/L (ref 98–111)
Creatinine, Ser: 0.86 mg/dL (ref 0.44–1.00)
GFR, Estimated: >60 mL/min (ref >60)
Glucose, Bld: 117 mg/dL — ABNORMAL HIGH (ref 70–99)
Potassium: 4.3 mmol/L (ref 3.5–5.1)
Sodium: 140 mmol/L (ref 135–145)
Total Bilirubin: 0.3 mg/dL (ref 0.3–1.2)
Total Protein: 7.5 g/dL (ref 6.5–8.1)

## 2021-08-08 LAB — SEDIMENTATION RATE: Sed Rate: 46 mm/hr — ABNORMAL HIGH (ref 0–30)

## 2021-08-08 NOTE — Patient Instructions (Signed)
You have completed 2 months of antibiotic- your toe is doing very well- swelling still persist but not as warm as before and the wound on the tip of the toe has healed- will do labs today. And may extend bactrim for chronic osteomyelitis

## 2021-08-08 NOTE — Progress Notes (Signed)
NAME: Sharon Gray  DOB: 1965/08/19  MRN: [id number]  Date/Time: 08/08/2021 10:37 AM   Subjective:  Sharon Gray is a 56 y.o. with a history of Diabetes mellitus, hypertension, CVA with right carotid artery stenosis status post ICA stent on Plavix, Patient is here with her husband.  For follow up Rt great toe infection- doing very well Has healed She has stopped bactrim after 2 months because of mildly elevated LFTS   Past Medical History:  Diagnosis Date   Diabetes mellitus without complication ([place])    Hypertension    Stroke [facility])     Past Surgical History:  Procedure Laterality Date   APPENDECTOMY     CAROTID PTA/STENT INTERVENTION Right 07/26/2020   Procedure: CAROTID PTA/STENT INTERVENTION;  Surgeon: Katha Cabal, MD;  Location: [place] [facility];  Service: Cardiovascular;  Laterality: Right;   LOWER EXTREMITY ANGIOGRAPHY Right 05/19/2021   Procedure: Lower Extremity Angiography;  Surgeon: Katha Cabal, MD;  Location: [place] [facility];  Service: Cardiovascular;  Laterality: Right;   NO PAST SURGERIES      Social History   Socioeconomic History   Marital status: Married    Spouse name: Barrister's clerk   Number of children: 4   Years of education: Not on file   Highest education level: Not on file  Occupational History   Not on file  Tobacco Use   Smoking status: Never    Passive exposure: Never   Smokeless tobacco: Never  Substance and Sexual Activity   Alcohol use: Yes    Comment: socially: only at big parties (2 years ago)   Drug use: Not Currently   Sexual activity: Not on file  Other Topics Concern   Not on file  Social History Narrative   Lives with husband and 3 kids at home    Social Determinants of Health   Financial Resource Strain: Not on file  Food Insecurity: Not on file  Transportation Needs: Not on file  Physical Activity: Not on file  Stress: Not on file  Social Connections: Not on file  Intimate Partner Violence: Not on file     Family History  Problem Relation Age of Onset   Stroke Father    No Known Allergies I? Current Outpatient Medications  Medication Sig Dispense Refill   ACCU-CHEK GUIDE test strip USE TO CHECK BLOOD SUGAR THREE TIMES A DAY     acetaminophen (TYLENOL) 500 MG tablet Take 500 mg by mouth every 6 (six) hours as needed for moderate pain or mild pain.     Artificial Tear Solution (TEARS NATURALE OP) Place 1 drop into both eyes daily as needed (itchy eye).     aspirin EC 81 MG EC tablet Take 1 tablet (81 mg total) by mouth daily. Swallow whole. 90 tablet 3   baclofen (LIORESAL) 10 MG tablet Take 1 tablet (10 mg total) by mouth daily as needed for muscle spasms. 30 each 0   celecoxib (CELEBREX) 200 MG capsule Take 1 capsule (200 mg total) by mouth daily as needed.     clopidogrel (PLAVIX) 75 MG tablet Take 1 tablet (75 mg total) by mouth daily. 90 tablet 3   enalapril (VASOTEC) 2.5 MG tablet Take 2.5 mg by mouth daily.     FARXIGA 10 MG TABS tablet Take 10 mg by mouth daily.     glimepiride (AMARYL) 4 MG tablet Take 4 mg by mouth 2 (two) times daily.     Multiple Vitamin (MULTIVITAMIN) tablet Take 1 tablet by mouth  daily.     rosuvastatin (CRESTOR) 40 MG tablet Take 40 mg by mouth daily.     Vitamin D, Ergocalciferol, (DRISDOL) 1.25 MG (50000 UNIT) CAPS capsule Take 50,000 Units by mouth once a week.     No current facility-administered medications for this visit.     Abtx:  Anti-infectives (From admission, onward)    None       REVIEW OF SYSTEMS:  Const: negative fever, negative chills, negative weight loss Eyes: poor eyesight, ? Glaucoma- getting injections ENT: negative coryza, negative sore throat Resp: negative cough, hemoptysis, dyspnea Cards: negative for chest pain, palpitations, lower extremity edema GU: negative for frequency, dysuria and hematuria GI: Negative for abdominal pain, diarrhea, bleeding, constipation Skin: negative for rash and pruritus Heme: negative for  easy bruising and gum/nose bleeding MS: negative for myalgias, arthralgias, back pain and muscle weakness Neurolo:negative for headaches, dizziness, vertigo, memory problems  Psych: negative for feelings of anxiety, depression  Endocrine: diabetes Allergy/Immunology- negative for any medication or food allergies  Objective:  VITALS:  BP (!) 143/67   Pulse 69   Temp (!) 97 F (36.1 C) (Temporal)   Ht 5' 3"  (1.6 m)   Wt 173 lb (78.5 kg)   BMI 30.65 kg/m   PHYSICAL EXAM:  General: Alert, cooperative, no distress, appears stated age.  Lungs: Clear to auscultation bilaterally. No Wheezing or Rhonchi. No rales. Heart: Regular rate and rhythm, no murmur, rub or gallop. 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  Culture had MRSA, Klebsiella and E. coli all sensitive to Bactrim The wound has healed Completed 2/12 months of antibiotic No further treatment needed  Abnormal LFTS could be from bactrim, also on high dose statin Observe  Will repeat labs today  Diabetes mellitus on Farxiga and glimepiride  Carotid stenosis right ICA stent on rosuvastatin Plavix and aspirin.  Discussed the management with the patient and her husband.     LFTs  abnormal she will follow up with PCP? ? ?follow pRN ___________________________________________________ Discussed with patient, and her husband in detail Called her with LFT results AST 50 ALT60 Alk po4 is 163  P.S ESR 46( was 126 on 05/18/21)

## 2021-08-28 ENCOUNTER — Other Ambulatory Visit: Payer: Self-pay | Admitting: Internal Medicine

## 2021-08-28 DIAGNOSIS — Z1231 Encounter for screening mammogram for malignant neoplasm of breast: Secondary | ICD-10-CM

## 2021-11-20 ENCOUNTER — Encounter (INDEPENDENT_AMBULATORY_CARE_PROVIDER_SITE_OTHER): Payer: Self-pay

## 2022-02-26 ENCOUNTER — Other Ambulatory Visit: Payer: Self-pay | Admitting: Internal Medicine

## 2022-02-26 ENCOUNTER — Other Ambulatory Visit: Payer: Medicaid Other

## 2022-02-27 ENCOUNTER — Ambulatory Visit (INDEPENDENT_AMBULATORY_CARE_PROVIDER_SITE_OTHER): Payer: Medicaid Other | Admitting: Internal Medicine

## 2022-02-27 ENCOUNTER — Encounter: Payer: Self-pay | Admitting: Internal Medicine

## 2022-02-27 ENCOUNTER — Other Ambulatory Visit: Payer: Self-pay

## 2022-02-27 DIAGNOSIS — Z1231 Encounter for screening mammogram for malignant neoplasm of breast: Secondary | ICD-10-CM

## 2022-02-27 DIAGNOSIS — M25521 Pain in right elbow: Secondary | ICD-10-CM | POA: Diagnosis not present

## 2022-02-27 DIAGNOSIS — E782 Mixed hyperlipidemia: Secondary | ICD-10-CM

## 2022-02-27 DIAGNOSIS — E1165 Type 2 diabetes mellitus with hyperglycemia: Secondary | ICD-10-CM | POA: Diagnosis not present

## 2022-02-27 DIAGNOSIS — I10 Essential (primary) hypertension: Secondary | ICD-10-CM

## 2022-02-27 LAB — CMP14+EGFR
ALT: 33 IU/L — ABNORMAL HIGH (ref 0–32)
AST: 35 IU/L (ref 0–40)
Albumin/Globulin Ratio: 1.5 (ref 1.2–2.2)
Albumin: 4.1 g/dL (ref 3.8–4.9)
Alkaline Phosphatase: 113 IU/L (ref 44–121)
BUN/Creatinine Ratio: 43 — ABNORMAL HIGH (ref 9–23)
BUN: 36 mg/dL — ABNORMAL HIGH (ref 6–24)
Bilirubin Total: 0.3 mg/dL (ref 0.0–1.2)
CO2: 21 mmol/L (ref 20–29)
Calcium: 8.9 mg/dL (ref 8.7–10.2)
Chloride: 107 mmol/L — ABNORMAL HIGH (ref 96–106)
Creatinine, Ser: 0.83 mg/dL (ref 0.57–1.00)
Globulin, Total: 2.7 g/dL (ref 1.5–4.5)
Glucose: 157 mg/dL — ABNORMAL HIGH (ref 70–99)
Potassium: 4.4 mmol/L (ref 3.5–5.2)
Sodium: 141 mmol/L (ref 134–144)
Total Protein: 6.8 g/dL (ref 6.0–8.5)
eGFR: 84 mL/min/{1.73_m2} (ref >59)

## 2022-02-27 LAB — GLUCOSE, POCT (MANUAL RESULT ENTRY): POC Glucose: 246 mg/dl — AB (ref 70–99)

## 2022-02-27 LAB — LIPID PANEL W/O CHOL/HDL RATIO
Cholesterol, Total: 110 mg/dL (ref 100–199)
HDL: 49 mg/dL (ref >39)
LDL Chol Calc (NIH): 41 mg/dL (ref 0–99)
Triglycerides: 112 mg/dL (ref 0–149)
VLDL Cholesterol Cal: 20 mg/dL (ref 5–40)

## 2022-02-27 LAB — HGB A1C W/O EAG: Hgb A1c MFr Bld: 7.9 % — ABNORMAL HIGH (ref 4.8–5.6)

## 2022-02-27 NOTE — Progress Notes (Signed)
   Established Patient Office Visit  Subjective   Patient ID: Sharon Gray, female    DOB: [birth year]  Age: 57 y.o. MRN: [id number]  Chief Complaint  Patient presents with   Follow-up    3 month follow up    HPI    ROS    Objective:     BP 130/72   Pulse 82   Ht 5\' 3"  (1.6 m)   Wt 192 lb 6.4 oz (87.3 kg)   SpO2 98%   BMI 34.08 kg/m    Physical Exam   Results for orders placed or performed in visit on 02/27/22  POCT glucose (manual entry)  Result Value Ref Range   POC Glucose 246 (A) 70 - 99 mg/dl      The ASCVD Risk score (Arnett DK, et al., 2019) failed to calculate for the following reasons:   The patient has a prior MI or stroke diagnosis    Assessment & Plan:   Problem List Items Addressed This Visit     Type 2 diabetes mellitus ([place])   Essential hypertension, benign   Mixed hyperlipidemia   Right elbow pain   Relevant Orders   Ambulatory referral to Orthopedic Surgery   Poorly controlled diabetes mellitus ([place])   Breast cancer screening by mammogram   Relevant Orders   MM DIGITAL SCREENING BILATERAL    Return in about 3 months (around 05/28/2022).    Perrin Maltese, MD

## 2022-02-27 NOTE — Progress Notes (Signed)
Established Patient Office Visit  Subjective   Patient ID: Sharon Gray, female    DOB: Nov 20, 1965  Age: 57 y.o. MRN: [id number]  Chief Complaint  Patient presents with   Follow-up    3 month follow up    HPI  Patient comes in for follow up and to discuss recent labs. Her Hgba1c has jumped up to 7.9 , along with some weight gain. Reports of recent demise of a beloved family member, so was eating more due to stress.Now understands the need for a strict diet. Hopes next labs will show a better Hgba1c. Mentions chronic constipation - takes Colace cap. Also mentions history of Right Elbow pain over several years - but now certain movements cause tingling and numbness of right forearm and hand.  Will set up for Ortho evlauation Missed her mammogram- reschedule. Review of Systems  Constitutional: Negative.   HENT: Negative.    Eyes: Negative.   Respiratory: Negative.    Gastrointestinal:  Positive for constipation.  Musculoskeletal: Negative.   Skin: Negative.   Neurological:  Positive for tingling and sensory change. Negative for focal weakness, seizures and weakness.  Endo/Heme/Allergies: Negative.   Psychiatric/Behavioral: Negative.       Objective:     BP 130/72   Pulse 82   Ht 5\' 3"  (1.6 m)   Wt 192 lb 6.4 oz (87.3 kg)   SpO2 98%   BMI 34.08 kg/m   Physical Exam Vitals and nursing note reviewed.  Constitutional:      Appearance: Normal appearance.  HENT:     Head: Normocephalic.  Cardiovascular:     Rate and Rhythm: Normal rate and regular rhythm.     Pulses: Normal pulses.     Heart sounds: Normal heart sounds.  Pulmonary:     Effort: Pulmonary effort is normal.     Breath sounds: Normal breath sounds.  Abdominal:     General: Abdomen is flat.     Palpations: Abdomen is soft.  Musculoskeletal:        General: Normal range of motion.     Cervical back: Normal range of motion and neck supple.     Right lower leg: No edema.     Left lower leg: No edema.   Skin:    General: Skin is warm and dry.     Findings: No bruising or erythema.  Neurological:     General: No focal deficit present.     Mental Status: She is alert. She is disoriented.     Cranial Nerves: No cranial nerve deficit.     Motor: No weakness.     Gait: Gait normal.  Psychiatric:        Mood and Affect: Mood normal.    Results for orders placed or performed in visit on 02/27/22  POCT glucose (manual entry)  Result Value Ref Range   POC Glucose 246 (A) 70 - 99 mg/dl      The ASCVD Risk score (Arnett DK, et al., 2019) failed to calculate for the following reasons:   The patient has a prior MI or stroke diagnosis    Assessment & Plan:   Problem List Items Addressed This Visit     Type 2 diabetes mellitus ([place])   Essential hypertension, benign   Mixed hyperlipidemia   Right elbow pain   Relevant Orders   Ambulatory referral to Orthopedic Surgery   Poorly controlled diabetes mellitus ([place])   Breast cancer screening by mammogram   Relevant Orders   MM  DIGITAL SCREENING BILATERAL    Return in about 3 months (around 05/28/2022).   Total Time Spent: 30 minutes  Perrin Maltese, MD

## 2022-03-30 ENCOUNTER — Other Ambulatory Visit: Payer: Self-pay | Admitting: Internal Medicine

## 2022-03-30 DIAGNOSIS — M7582 Other shoulder lesions, left shoulder: Secondary | ICD-10-CM

## 2022-05-10 ENCOUNTER — Other Ambulatory Visit: Payer: Self-pay | Admitting: Internal Medicine

## 2022-05-28 ENCOUNTER — Ambulatory Visit: Payer: Medicaid Other | Admitting: Internal Medicine

## 2022-05-28 ENCOUNTER — Encounter: Payer: Self-pay | Admitting: Internal Medicine

## 2022-05-28 VITALS — BP 138/72 | HR 83 | Ht 63.0 in | Wt 191.0 lb

## 2022-05-28 DIAGNOSIS — Z1231 Encounter for screening mammogram for malignant neoplasm of breast: Secondary | ICD-10-CM

## 2022-05-28 DIAGNOSIS — I10 Essential (primary) hypertension: Secondary | ICD-10-CM

## 2022-05-28 DIAGNOSIS — E1165 Type 2 diabetes mellitus with hyperglycemia: Secondary | ICD-10-CM | POA: Diagnosis not present

## 2022-05-28 DIAGNOSIS — E782 Mixed hyperlipidemia: Secondary | ICD-10-CM

## 2022-05-28 LAB — POCT CBG (FASTING - GLUCOSE)-MANUAL ENTRY: Glucose Fasting, POC: 241 mg/dL — AB (ref 70–99)

## 2022-05-28 LAB — POC CREATINE & ALBUMIN,URINE
Albumin/Creatinine Ratio, Urine, POC: >300
Creatinine, POC: 50 mg/dL
Microalbumin Ur, POC: 80 mg/L

## 2022-05-28 NOTE — Progress Notes (Signed)
Established Patient Office Visit  Subjective:  Patient ID: Sharon Gray, female    DOB: Apr 26, 1965  Age: 57 y.o. MRN: [id number]  Chief Complaint  Patient presents with   Follow-up    3 month follow up    Patient comes in for follow-up today.  She is accompanied by her husband.  She states that she is a little tired today as she has been working out in the yard but she enjoys it a lot.  She will return fasting for blood work.  She gets regular eye exams, will need her urine for microalbumin today.  She has not done her mammogram yet but agrees that she will do it next month.  Will send in another order. She has no other complaints.    No other concerns at this time.   Past Medical History:  Diagnosis Date   Diabetes mellitus without complication (HCC)    Hypertension    Stroke [facility])     Past Surgical History:  Procedure Laterality Date   APPENDECTOMY     CAROTID PTA/STENT INTERVENTION Right 07/26/2020   Procedure: CAROTID PTA/STENT INTERVENTION;  Surgeon: Renford Dills, MD;  Location: [facility];  Service: Cardiovascular;  Laterality: Right;   LOWER EXTREMITY ANGIOGRAPHY Right 05/19/2021   Procedure: Lower Extremity Angiography;  Surgeon: Renford Dills, MD;  Location: [facility];  Service: Cardiovascular;  Laterality: Right;   NO PAST SURGERIES      Social History   Socioeconomic History   Marital status: Married    Spouse name: Energy manager   Number of children: 4   Years of education: Not on file   Highest education level: Not on file  Occupational History   Not on file  Tobacco Use   Smoking status: Never    Passive exposure: Never   Smokeless tobacco: Never  Substance and Sexual Activity   Alcohol use: Yes    Comment: socially: only at big parties (2 years ago)   Drug use: Not Currently   Sexual activity: Not on file  Other Topics Concern   Not on file  Social History Narrative   Lives with husband and 3 kids at home    Social  Determinants of Health   Financial Resource Strain: Not on file  Food Insecurity: Not on file  Transportation Needs: Not on file  Physical Activity: Not on file  Stress: Not on file  Social Connections: Not on file  Intimate Partner Violence: Not on file    Family History  Problem Relation Age of Onset   Stroke Father     Not on File  Review of Systems  Constitutional:  Negative for chills, fever, malaise/fatigue and weight loss.  HENT: Negative.    Eyes:  Positive for blurred vision. Negative for double vision, photophobia, pain, discharge and redness.  Respiratory:  Negative for cough, hemoptysis and shortness of breath.   Cardiovascular:  Negative for chest pain, palpitations, orthopnea, claudication, leg swelling and PND.  Gastrointestinal:  Positive for constipation. Negative for abdominal pain, blood in stool, diarrhea, heartburn, melena and vomiting.  Genitourinary: Negative.   Musculoskeletal: Negative.   Skin: Negative.   Neurological: Negative.   Psychiatric/Behavioral: Negative.         Objective:   BP 138/72   Pulse 83   Ht 5\' 3"  (1.6 m)   Wt 191 lb (86.6 kg)   SpO2 98%   BMI 33.83 kg/m   Vitals:   05/28/22 1305  BP: 138/72  Pulse: 83  Height: 5\' 3"  (1.6 m)  Weight: 191 lb (86.6 kg)  SpO2: 98%  BMI (Calculated): 33.84    Physical Exam Vitals and nursing note reviewed.  Constitutional:      Appearance: Normal appearance.  HENT:     Head: Normocephalic.  Cardiovascular:     Rate and Rhythm: Normal rate and regular rhythm.     Pulses: Normal pulses.     Heart sounds: Normal heart sounds.  Pulmonary:     Effort: Pulmonary effort is normal.     Breath sounds: Normal breath sounds.  Abdominal:     Palpations: Abdomen is soft.  Musculoskeletal:        General: Normal range of motion.     Cervical back: Normal range of motion and neck supple.  Skin:    General: Skin is warm.  Neurological:     General: No focal deficit present.      Mental Status: She is alert and oriented to person, place, and time.  Psychiatric:        Mood and Affect: Mood normal.        Behavior: Behavior normal.      Results for orders placed or performed in visit on 05/28/22  POCT CBG (Fasting - Glucose)  Result Value Ref Range   Glucose Fasting, POC 241 (A) 70 - 99 mg/dL  POC CREATINE & ALBUMIN,URINE  Result Value Ref Range   Microalbumin Ur, POC 80 mg/L   Creatinine, POC 50 mg/dL   Albumin/Creatinine Ratio, Urine, POC >300     Recent Results (from the past 2160 hour(s))  POCT CBG (Fasting - Glucose)     Status: Abnormal   Collection Time: 05/28/22  1:09 PM  Result Value Ref Range   Glucose Fasting, POC 241 (A) 70 - 99 mg/dL    Comment: non-fasting  POC CREATINE & ALBUMIN,URINE     Status: Abnormal   Collection Time: 05/28/22  1:24 PM  Result Value Ref Range   Microalbumin Ur, POC 80 mg/L   Creatinine, POC 50 mg/dL   Albumin/Creatinine Ratio, Urine, POC >300       Assessment & Plan:   Fasting blood work.  Schedule mammogram.  Continue all medications as such.  Will adjust meds once results are back. Problem List Items Addressed This Visit     Type 2 diabetes mellitus (HCC) - Primary   Relevant Orders   POCT CBG (Fasting - Glucose) (Completed)   POC CREATINE & ALBUMIN,URINE (Completed)   Hemoglobin A1c   Essential hypertension, benign   Relevant Orders   CMP14+EGFR   CBC With Differential   Mixed hyperlipidemia   Relevant Orders   Lipid Panel w/o Chol/HDL Ratio   Breast cancer screening by mammogram   Relevant Orders   MM 3D SCREENING MAMMOGRAM BILATERAL BREAST    Return in about 3 months (around 08/28/2022).   Total time spent: 30 minutes  Margaretann Loveless, MD  05/28/2022

## 2022-05-29 ENCOUNTER — Other Ambulatory Visit: Payer: Medicaid Other

## 2022-05-30 LAB — CBC WITH DIFFERENTIAL
Basophils Absolute: 0 10*3/uL (ref 0.0–0.2)
Basos: 1 %
EOS (ABSOLUTE): 0.3 10*3/uL (ref 0.0–0.4)
Eos: 4 %
Hematocrit: 38.1 % (ref 34.0–46.6)
Hemoglobin: 12.5 g/dL (ref 11.1–15.9)
Immature Grans (Abs): 0 10*3/uL (ref 0.0–0.1)
Immature Granulocytes: 0 %
Lymphocytes Absolute: 2.2 10*3/uL (ref 0.7–3.1)
Lymphs: 28 %
MCH: 28.7 pg (ref 26.6–33.0)
MCHC: 32.8 g/dL (ref 31.5–35.7)
MCV: 87 fL (ref 79–97)
Monocytes Absolute: 0.8 10*3/uL (ref 0.1–0.9)
Monocytes: 10 %
Neutrophils Absolute: 4.4 10*3/uL (ref 1.4–7.0)
Neutrophils: 57 %
RBC: 4.36 x10E6/uL (ref 3.77–5.28)
RDW: 12 % (ref 11.7–15.4)
WBC: 7.7 10*3/uL (ref 3.4–10.8)

## 2022-05-30 LAB — CMP14+EGFR
ALT: 44 IU/L — ABNORMAL HIGH (ref 0–32)
AST: 47 IU/L — ABNORMAL HIGH (ref 0–40)
Albumin/Globulin Ratio: 1.3 (ref 1.2–2.2)
Albumin: 4 g/dL (ref 3.8–4.9)
Alkaline Phosphatase: 133 IU/L — ABNORMAL HIGH (ref 44–121)
BUN/Creatinine Ratio: 38 — ABNORMAL HIGH (ref 9–23)
BUN: 38 mg/dL — ABNORMAL HIGH (ref 6–24)
Bilirubin Total: <0.2 mg/dL (ref 0.0–1.2)
CO2: 19 mmol/L — ABNORMAL LOW (ref 20–29)
Calcium: 8.7 mg/dL (ref 8.7–10.2)
Chloride: 105 mmol/L (ref 96–106)
Creatinine, Ser: 1.01 mg/dL — ABNORMAL HIGH (ref 0.57–1.00)
Globulin, Total: 3 g/dL (ref 1.5–4.5)
Glucose: 184 mg/dL — ABNORMAL HIGH (ref 70–99)
Potassium: 4.5 mmol/L (ref 3.5–5.2)
Sodium: 140 mmol/L (ref 134–144)
Total Protein: 7 g/dL (ref 6.0–8.5)
eGFR: 66 mL/min/{1.73_m2} (ref >59)

## 2022-05-30 LAB — HEMOGLOBIN A1C
Est. average glucose Bld gHb Est-mCnc: 192 mg/dL
Hgb A1c MFr Bld: 8.3 % — ABNORMAL HIGH (ref 4.8–5.6)

## 2022-05-30 LAB — LIPID PANEL W/O CHOL/HDL RATIO
Cholesterol, Total: 103 mg/dL (ref 100–199)
HDL: 50 mg/dL (ref >39)
LDL Chol Calc (NIH): 37 mg/dL (ref 0–99)
Triglycerides: 75 mg/dL (ref 0–149)
VLDL Cholesterol Cal: 16 mg/dL (ref 5–40)

## 2022-06-05 ENCOUNTER — Other Ambulatory Visit (INDEPENDENT_AMBULATORY_CARE_PROVIDER_SITE_OTHER): Payer: Self-pay | Admitting: Vascular Surgery

## 2022-06-05 DIAGNOSIS — I70239 Atherosclerosis of native arteries of right leg with ulceration of unspecified site: Secondary | ICD-10-CM

## 2022-06-07 ENCOUNTER — Other Ambulatory Visit (INDEPENDENT_AMBULATORY_CARE_PROVIDER_SITE_OTHER): Payer: Self-pay | Admitting: Nurse Practitioner

## 2022-06-07 ENCOUNTER — Ambulatory Visit (INDEPENDENT_AMBULATORY_CARE_PROVIDER_SITE_OTHER): Payer: Medicaid Other | Admitting: Nurse Practitioner

## 2022-06-07 ENCOUNTER — Encounter (INDEPENDENT_AMBULATORY_CARE_PROVIDER_SITE_OTHER): Payer: Self-pay | Admitting: Nurse Practitioner

## 2022-06-07 ENCOUNTER — Ambulatory Visit (INDEPENDENT_AMBULATORY_CARE_PROVIDER_SITE_OTHER): Payer: Medicaid Other

## 2022-06-07 VITALS — BP 129/70 | HR 72 | Resp 18 | Ht 65.0 in | Wt 192.4 lb

## 2022-06-07 DIAGNOSIS — E1159 Type 2 diabetes mellitus with other circulatory complications: Secondary | ICD-10-CM

## 2022-06-07 DIAGNOSIS — I6523 Occlusion and stenosis of bilateral carotid arteries: Secondary | ICD-10-CM

## 2022-06-07 DIAGNOSIS — I152 Hypertension secondary to endocrine disorders: Secondary | ICD-10-CM

## 2022-06-07 DIAGNOSIS — I70239 Atherosclerosis of native arteries of right leg with ulceration of unspecified site: Secondary | ICD-10-CM

## 2022-06-07 DIAGNOSIS — I6521 Occlusion and stenosis of right carotid artery: Secondary | ICD-10-CM

## 2022-06-08 ENCOUNTER — Encounter (INDEPENDENT_AMBULATORY_CARE_PROVIDER_SITE_OTHER): Payer: Self-pay | Admitting: Nurse Practitioner

## 2022-06-08 NOTE — Progress Notes (Signed)
Subjective:    Patient ID: Sharon Gray, female    DOB: 1965/12/25, 57 y.o.   MRN: [id number] Chief Complaint  Patient presents with   Follow-up    Follow up   1 year carotid     [name] is a 57 year old female that is seen for follow up evaluation of carotid stenosis status post right  carotid stent on 07/26/2020.  There were no post operative problems or complications related to the surgery.  The patient denies neck or incisional pain.   The patient denies interval amaurosis fugax. There is no recent history of TIA symptoms or focal motor deficits. There is prior documented CVA.   The patient denies headache.   The patient is taking enteric-coated aspirin 81 mg daily.   The patient has a history of coronary artery disease, no recent episodes of angina or shortness of breath. The patient denies PAD or claudication symptoms.  She recently underwent angiogram due to slow healing ulceration while hospitalized and this was found to be open and patent. There is a history of hyperlipidemia which is being treated with a statin.     Today noninvasive studies shows 40 to 59% stenosis of the right ICA with 60 to 79% stenosis of the left ICA.  This is a notable difference from the previous studies last year which showed 1 to 39% stenosis bilaterally.    Review of Systems  All other systems reviewed and are negative.      Objective:   Physical Exam Vitals reviewed.  HENT:     Head: Normocephalic.  Neck:     Vascular: No carotid bruit.  Cardiovascular:     Rate and Rhythm: Normal rate.     Pulses: Normal pulses.  Pulmonary:     Effort: Pulmonary effort is normal.  Skin:    General: Skin is warm and dry.  Neurological:     Mental Status: She is alert and oriented to person, place, and time.  Psychiatric:        Mood and Affect: Mood normal.        Behavior: Behavior normal.        Thought Content: Thought content normal.        Judgment: Judgment normal.     BP 129/70  (BP Location: Left Arm)   Pulse 72   Resp 18   Ht 5\' 5"  (1.651 m)   Wt 192 lb 6.4 oz (87.3 kg)   BMI 32.02 kg/m   Past Medical History:  Diagnosis Date   Diabetes mellitus without complication (HCC)    Hypertension    Stroke [facility])     Social History   Socioeconomic History   Marital status: Married    Spouse name: Energy manager   Number of children: 4   Years of education: Not on file   Highest education level: Not on file  Occupational History   Not on file  Tobacco Use   Smoking status: Never    Passive exposure: Never   Smokeless tobacco: Never  Substance and Sexual Activity   Alcohol use: Yes    Comment: socially: only at big parties (2 years ago)   Drug use: Not Currently   Sexual activity: Not on file  Other Topics Concern   Not on file  Social History Narrative   Lives with husband and 3 kids at home    Social Determinants of Health   Financial Resource Strain: Not on file  Food Insecurity: Not on file  Transportation Needs:  Not on file  Physical Activity: Not on file  Stress: Not on file  Social Connections: Not on file  Intimate Partner Violence: Not on file    Past Surgical History:  Procedure Laterality Date   APPENDECTOMY     CAROTID PTA/STENT INTERVENTION Right 07/26/2020   Procedure: CAROTID PTA/STENT INTERVENTION;  Surgeon: Renford Dills, MD;  Location: [facility];  Service: Cardiovascular;  Laterality: Right;   LOWER EXTREMITY ANGIOGRAPHY Right 05/19/2021   Procedure: Lower Extremity Angiography;  Surgeon: Renford Dills, MD;  Location: [facility];  Service: Cardiovascular;  Laterality: Right;   NO PAST SURGERIES      Family History  Problem Relation Age of Onset   Stroke Father     No Known Allergies     Latest Ref Rng & Units 05/29/2022    8:57 AM 08/08/2021   11:20 AM 07/04/2021    9:59 AM  CBC  WBC 3.4 - 10.8 x10E3/uL 7.7  5.5  6.4   Hemoglobin 11.1 - 15.9 g/dL 96.0  45.4  09.8   Hematocrit 34.0 - 46.6 %  38.1  37.1  37.0   Platelets 150 - 400 K/uL  225  239       CMP     Component Value Date/Time   NA 140 05/29/2022 0857   NA 137 01/27/2014 0144   K 4.5 05/29/2022 0857   K 4.1 01/27/2014 0144   CL 105 05/29/2022 0857   CL 102 01/27/2014 0144   CO2 19 (L) 05/29/2022 0857   CO2 27 01/27/2014 0144   GLUCOSE 184 (H) 05/29/2022 0857   GLUCOSE 117 (H) 08/08/2021 1120   GLUCOSE 401 (H) 01/27/2014 0144   BUN 38 (H) 05/29/2022 0857   BUN 18 01/27/2014 0144   CREATININE 1.01 (H) 05/29/2022 0857   CREATININE 0.93 01/27/2014 0144   CALCIUM 8.7 05/29/2022 0857   CALCIUM 8.4 (L) 01/27/2014 0144   PROT 7.0 05/29/2022 0857   PROT 8.1 01/27/2014 0144   ALBUMIN 4.0 05/29/2022 0857   ALBUMIN 3.7 01/27/2014 0144   AST 47 (H) 05/29/2022 0857   AST 14 (L) 01/27/2014 0144   ALT 44 (H) 05/29/2022 0857   ALT 27 01/27/2014 0144   ALKPHOS 133 (H) 05/29/2022 0857   ALKPHOS 140 (H) 01/27/2014 0144   BILITOT <0.2 05/29/2022 0857   BILITOT 0.2 01/27/2014 0144   GFRNONAA >60 08/08/2021 1120   GFRNONAA >60 01/27/2014 0144   GFRAA >60 01/27/2014 0144     No results found.     Assessment & Plan:   1. Carotid artery stenosis, symptomatic, right Recommend:  Given the patient's asymptomatic subcritical stenosis no further invasive testing or surgery at this time.  Duplex ultrasound notable growth of 40 to 59% stenosis of the right ICA with 60 to 79% stenosis of the left ICA.  Given this difference between studies on the patient follow-up in 6 6 months to repeat studies  Continue antiplatelet therapy as prescribed Continue management of CAD, HTN and Hyperlipidemia Healthy heart diet,  encouraged exercise at least 4 times per week Follow up in 6 months with duplex ultrasound and physical exam    2. Type 2 diabetes mellitus with other circulatory complication, unspecified whether long term insulin use (HCC) Continue hypoglycemic medications as already ordered, these medications have been  reviewed and there are no changes at this time.  Hgb A1C to be monitored as already arranged by primary service   3. Hypertension associated with diabetes (HCC)  Continue antihypertensive medications as already ordered, these medications have been reviewed and there are no changes at this time.    Current Outpatient Medications on File Prior to Visit  Medication Sig Dispense Refill   ACCU-CHEK GUIDE test strip USE TO CHECK BLOOD SUGAR THREE TIMES A DAY 100 strip 6   acetaminophen (TYLENOL) 500 MG tablet Take 500 mg by mouth every 6 (six) hours as needed for moderate pain or mild pain.     Artificial Tear Solution (TEARS NATURALE OP) Place 1 drop into both eyes daily as needed (itchy eye).     aspirin EC 81 MG EC tablet Take 1 tablet (81 mg total) by mouth daily. Swallow whole. 90 tablet 3   atorvastatin (LIPITOR) 20 MG tablet Take 20 mg by mouth daily.     baclofen (LIORESAL) 10 MG tablet Take 1 tablet (10 mg total) by mouth daily as needed for muscle spasms. 30 each 0   celecoxib (CELEBREX) 200 MG capsule TAKE 1 CAPSULE BY MOUTH EVERY DAY WITH FOOD 30 capsule 2   clopidogrel (PLAVIX) 75 MG tablet Take 1 tablet (75 mg total) by mouth daily. 90 tablet 3   enalapril (VASOTEC) 2.5 MG tablet Take 2.5 mg by mouth daily.     FARXIGA 10 MG TABS tablet Take 10 mg by mouth daily.     glimepiride (AMARYL) 4 MG tablet TAKE 1 TABLET BY MOUTH TWICE A DAY 180 tablet 2   losartan (COZAAR) 50 MG tablet Take 50 mg by mouth daily.     Multiple Vitamin (MULTIVITAMIN) tablet Take 1 tablet by mouth daily.     rosuvastatin (CRESTOR) 40 MG tablet Take 40 mg by mouth daily.     Vitamin D, Ergocalciferol, (DRISDOL) 1.25 MG (50000 UNIT) CAPS capsule Take 50,000 Units by mouth once a week.     No current facility-administered medications on file prior to visit.    There are no Patient Instructions on file for this visit. No follow-ups on file.   Georgiana Spinner, NP

## 2022-06-14 ENCOUNTER — Other Ambulatory Visit (INDEPENDENT_AMBULATORY_CARE_PROVIDER_SITE_OTHER): Payer: Self-pay | Admitting: Nurse Practitioner

## 2022-06-22 ENCOUNTER — Other Ambulatory Visit: Payer: Self-pay | Admitting: Internal Medicine

## 2022-06-22 DIAGNOSIS — M7582 Other shoulder lesions, left shoulder: Secondary | ICD-10-CM

## 2022-08-14 ENCOUNTER — Other Ambulatory Visit: Payer: Self-pay | Admitting: Internal Medicine

## 2022-08-28 ENCOUNTER — Encounter: Payer: Self-pay | Admitting: Internal Medicine

## 2022-08-28 ENCOUNTER — Ambulatory Visit: Payer: Medicaid Other | Admitting: Internal Medicine

## 2022-08-28 VITALS — BP 119/70 | HR 71 | Ht 65.0 in | Wt 193.8 lb

## 2022-08-28 DIAGNOSIS — E782 Mixed hyperlipidemia: Secondary | ICD-10-CM | POA: Diagnosis not present

## 2022-08-28 DIAGNOSIS — I10 Essential (primary) hypertension: Secondary | ICD-10-CM | POA: Diagnosis not present

## 2022-08-28 DIAGNOSIS — E1165 Type 2 diabetes mellitus with hyperglycemia: Secondary | ICD-10-CM | POA: Diagnosis not present

## 2022-08-28 LAB — POCT CBG (FASTING - GLUCOSE)-MANUAL ENTRY: Glucose Fasting, POC: 296 mg/dL — AB (ref 70–99)

## 2022-08-28 LAB — POC CREATINE & ALBUMIN,URINE
Albumin/Creatinine Ratio, Urine, POC: >300
Creatinine, POC: 10 mg/dL
Microalbumin Ur, POC: 80 mg/L

## 2022-08-28 NOTE — Progress Notes (Signed)
Established Patient Office Visit  Subjective:  Patient ID: Sharon Gray, female    DOB: November 10, 1965  Age: 57 y.o. MRN: [id number]  Chief Complaint  Patient presents with   Follow-up    3 mo F/U    Patient comes in for follow-up today accompanied by her husband.  Her fingerstick glucose is high as she did not take her medicines this morning.  At her last blood work done 3 months ago her hemoglobin A1c had gone up.  Patient was being contacted to adjust her medications but she did not reply.  Will check labs again today and bring her in 1 week to further adjust her medications.  She offers no new complaints today. Still thinking about getting a mammogram done. Will check urine microalbumin today.    No other concerns at this time.   Past Medical History:  Diagnosis Date   Diabetes mellitus without complication (HCC)    Hypertension    Stroke [facility])     Past Surgical History:  Procedure Laterality Date   APPENDECTOMY     CAROTID PTA/STENT INTERVENTION Right 07/26/2020   Procedure: CAROTID PTA/STENT INTERVENTION;  Surgeon: Renford Dills, MD;  Location: [facility] INVASIVE CV LAB;  Service: Cardiovascular;  Laterality: Right;   LOWER EXTREMITY ANGIOGRAPHY Right 05/19/2021   Procedure: Lower Extremity Angiography;  Surgeon: Renford Dills, MD;  Location: [facility] INVASIVE CV LAB;  Service: Cardiovascular;  Laterality: Right;   NO PAST SURGERIES      Social History   Socioeconomic History   Marital status: Married    Spouse name: Energy manager   Number of children: 4   Years of education: Not on file   Highest education level: Not on file  Occupational History   Not on file  Tobacco Use   Smoking status: Never    Passive exposure: Never   Smokeless tobacco: Never  Substance and Sexual Activity   Alcohol use: Yes    Comment: socially: only at big parties (2 years ago)   Drug use: Not Currently   Sexual activity: Not on file  Other Topics Concern   Not on file  Social History  Narrative   Lives with husband and 3 kids at home    Social Determinants of Health   Financial Resource Strain: Not on file  Food Insecurity: Not on file  Transportation Needs: Not on file  Physical Activity: Not on file  Stress: Not on file  Social Connections: Not on file  Intimate Partner Violence: Not on file    Family History  Problem Relation Age of Onset   Stroke Father     No Known Allergies  Review of Systems  Constitutional: Negative.   HENT: Negative.    Eyes: Negative.   Respiratory: Negative.  Negative for cough and shortness of breath.   Cardiovascular: Negative.  Negative for chest pain, palpitations and leg swelling.  Gastrointestinal: Negative.  Negative for abdominal pain, constipation, diarrhea, heartburn, nausea and vomiting.  Genitourinary: Negative.  Negative for dysuria and flank pain.  Musculoskeletal: Negative.  Negative for joint pain and myalgias.  Skin: Negative.   Neurological: Negative.  Negative for dizziness and headaches.  Endo/Heme/Allergies: Negative.   Psychiatric/Behavioral: Negative.  Negative for depression and suicidal ideas. The patient is not nervous/anxious.        Objective:   BP 119/70   Pulse 71   Ht 5\' 5"  (1.651 m)   Wt 193 lb 12.8 oz (87.9 kg)   SpO2 98%   BMI 32.25  kg/m   Vitals:   08/28/22 1307  BP: 119/70  Pulse: 71  Height: 5\' 5"  (1.651 m)  Weight: 193 lb 12.8 oz (87.9 kg)  SpO2: 98%  BMI (Calculated): 32.25    Physical Exam Vitals and nursing note reviewed.  Constitutional:      Appearance: Normal appearance.  HENT:     Head: Normocephalic and atraumatic.     Nose: Nose normal.     Mouth/Throat:     Mouth: Mucous membranes are moist.     Pharynx: Oropharynx is clear.  Eyes:     Conjunctiva/sclera: Conjunctivae normal.     Pupils: Pupils are equal, round, and reactive to light.  Cardiovascular:     Rate and Rhythm: Normal rate and regular rhythm.     Pulses: Normal pulses.     Heart sounds:  Normal heart sounds. No murmur heard. Pulmonary:     Effort: Pulmonary effort is normal.     Breath sounds: Normal breath sounds. No wheezing, rhonchi or rales.  Abdominal:     General: Bowel sounds are normal. There is no distension.     Palpations: Abdomen is soft. There is no mass.     Tenderness: There is no abdominal tenderness. There is no right CVA tenderness, left CVA tenderness or guarding.  Musculoskeletal:        General: Normal range of motion.     Cervical back: Normal range of motion.     Right lower leg: No edema.     Left lower leg: No edema.  Skin:    General: Skin is warm and dry.  Neurological:     General: No focal deficit present.     Mental Status: She is alert and oriented to person, place, and time.  Psychiatric:        Mood and Affect: Mood normal.        Behavior: Behavior normal.      Results for orders placed or performed in visit on 08/28/22  POCT CBG (Fasting - Glucose)  Result Value Ref Range   Glucose Fasting, POC 296 (A) 70 - 99 mg/dL  POC CREATINE & ALBUMIN,URINE  Result Value Ref Range   Microalbumin Ur, POC 80 mg/L   Creatinine, POC 10 mg/dL   Albumin/Creatinine Ratio, Urine, POC >300     Recent Results (from the past 2160 hour(s))  POCT CBG (Fasting - Glucose)     Status: Abnormal   Collection Time: 08/28/22  1:12 PM  Result Value Ref Range   Glucose Fasting, POC 296 (A) 70 - 99 mg/dL  POC CREATINE & ALBUMIN,URINE     Status: Abnormal   Collection Time: 08/28/22  1:52 PM  Result Value Ref Range   Microalbumin Ur, POC 80 mg/L   Creatinine, POC 10 mg/dL   Albumin/Creatinine Ratio, Urine, POC >300       Assessment & Plan:  Check labs today.  Return in 1 week to adjust medications.  Strict diet control emphasized. Problem List Items Addressed This Visit     Type 2 diabetes mellitus (HCC) - Primary   Relevant Orders   POCT CBG (Fasting - Glucose) (Completed)   Hemoglobin A1c   Essential hypertension, benign   Relevant Orders    CBC With Differential   CMP14+EGFR   POC CREATINE & ALBUMIN,URINE (Completed)   Mixed hyperlipidemia   Relevant Orders   Lipid Panel w/o Chol/HDL Ratio    Return in about 1 week (around 09/04/2022).   Total time spent: 30 minutes  Margaretann Loveless, MD  08/28/2022   This document may have been prepared by [facility] Voice Recognition software and as such may include unintentional dictation errors.

## 2022-09-04 ENCOUNTER — Ambulatory Visit: Payer: Medicaid Other | Admitting: Internal Medicine

## 2022-09-05 ENCOUNTER — Other Ambulatory Visit: Payer: Self-pay | Admitting: Internal Medicine

## 2022-09-11 ENCOUNTER — Ambulatory Visit: Payer: Medicaid Other | Admitting: Internal Medicine

## 2022-09-11 ENCOUNTER — Encounter: Payer: Self-pay | Admitting: Internal Medicine

## 2022-09-11 VITALS — BP 138/78 | HR 100 | Ht 63.0 in | Wt 196.6 lb

## 2022-09-11 DIAGNOSIS — E782 Mixed hyperlipidemia: Secondary | ICD-10-CM | POA: Diagnosis not present

## 2022-09-11 DIAGNOSIS — E1165 Type 2 diabetes mellitus with hyperglycemia: Secondary | ICD-10-CM

## 2022-09-11 DIAGNOSIS — I10 Essential (primary) hypertension: Secondary | ICD-10-CM

## 2022-09-11 LAB — POCT CBG (FASTING - GLUCOSE)-MANUAL ENTRY: Glucose Fasting, POC: 298 mg/dL — AB (ref 70–99)

## 2022-09-11 MED ORDER — RYBELSUS 3 MG PO TABS: 1.0000 | ORAL_TABLET | Freq: Every day | ORAL | 0 refills | Status: DC

## 2022-09-11 NOTE — Progress Notes (Signed)
Established Patient Office Visit  Subjective:  Patient ID: Sharon Gray, female    DOB: Aug 02, 1965  Age: 57 y.o. MRN: [id number]  Chief Complaint  Patient presents with   Follow-up    1 week follow up    Patient comes in for follow-up, accompanied by her son today.  She is here to discuss the lab results and to adjust her medications further.  Her hemoglobin A1c is 8.0 as compared to 8.3 before.  She has gained weight and her blood pressure is also above normal. She agrees to add Rybelsus at 3 mg/day, to her Marcelline Deist and glimepiride. She does not want to use injectables at this time.  Patient advised that eventually she might need insulin treatment. She understands and promises to be strict about her diet control and increase her physical activity.    No other concerns at this time.   Past Medical History:  Diagnosis Date   Diabetes mellitus without complication (HCC)    Hypertension    Stroke [facility])     Past Surgical History:  Procedure Laterality Date   APPENDECTOMY     CAROTID PTA/STENT INTERVENTION Right 07/26/2020   Procedure: CAROTID PTA/STENT INTERVENTION;  Surgeon: Renford Dills, MD;  Location: [facility] INVASIVE CV LAB;  Service: Cardiovascular;  Laterality: Right;   LOWER EXTREMITY ANGIOGRAPHY Right 05/19/2021   Procedure: Lower Extremity Angiography;  Surgeon: Renford Dills, MD;  Location: [facility] INVASIVE CV LAB;  Service: Cardiovascular;  Laterality: Right;   NO PAST SURGERIES      Social History   Socioeconomic History   Marital status: Married    Spouse name: Energy manager   Number of children: 4   Years of education: Not on file   Highest education level: Not on file  Occupational History   Not on file  Tobacco Use   Smoking status: Never    Passive exposure: Never   Smokeless tobacco: Never  Substance and Sexual Activity   Alcohol use: Yes    Comment: socially: only at big parties (2 years ago)   Drug use: Not Currently   Sexual activity: Not on file   Other Topics Concern   Not on file  Social History Narrative   Lives with husband and 3 kids at home    Social Determinants of Health   Financial Resource Strain: Not on file  Food Insecurity: Not on file  Transportation Needs: Not on file  Physical Activity: Not on file  Stress: Not on file  Social Connections: Not on file  Intimate Partner Violence: Not on file    Family History  Problem Relation Age of Onset   Stroke Father     No Known Allergies  Review of Systems  Constitutional: Negative.  Negative for chills, diaphoresis, fever, malaise/fatigue and weight loss.  HENT: Negative.  Negative for congestion and sore throat.   Eyes: Negative.   Respiratory: Negative.  Negative for cough and shortness of breath.   Cardiovascular: Negative.  Negative for chest pain, palpitations and leg swelling.  Gastrointestinal: Negative.  Negative for abdominal pain, constipation, diarrhea, heartburn, nausea and vomiting.  Genitourinary: Negative.  Negative for dysuria and flank pain.  Musculoskeletal: Negative.  Negative for joint pain and myalgias.  Skin: Negative.   Neurological: Negative.  Negative for dizziness and headaches.  Endo/Heme/Allergies: Negative.   Psychiatric/Behavioral: Negative.  Negative for depression and suicidal ideas. The patient is not nervous/anxious.        Objective:   BP 138/78   Pulse 100  Ht 5\' 3"  (1.6 m)   Wt 196 lb 9.6 oz (89.2 kg)   SpO2 96%   BMI 34.83 kg/m   Vitals:   09/11/22 1341  BP: 138/78  Pulse: 100  Height: 5\' 3"  (1.6 m)  Weight: 196 lb 9.6 oz (89.2 kg)  SpO2: 96%  BMI (Calculated): 34.83    Physical Exam Vitals and nursing note reviewed.  Constitutional:      Appearance: Normal appearance.  HENT:     Head: Normocephalic and atraumatic.     Nose: Nose normal.     Mouth/Throat:     Mouth: Mucous membranes are moist.     Pharynx: Oropharynx is clear.  Eyes:     Conjunctiva/sclera: Conjunctivae normal.     Pupils:  Pupils are equal, round, and reactive to light.  Cardiovascular:     Rate and Rhythm: Normal rate and regular rhythm.     Pulses: Normal pulses.     Heart sounds: Normal heart sounds. No murmur heard. Pulmonary:     Effort: Pulmonary effort is normal.     Breath sounds: Normal breath sounds. No wheezing.  Abdominal:     General: Bowel sounds are normal.     Palpations: Abdomen is soft.     Tenderness: There is no abdominal tenderness. There is no right CVA tenderness or left CVA tenderness.  Musculoskeletal:        General: Normal range of motion.     Cervical back: Normal range of motion.     Right lower leg: No edema.     Left lower leg: No edema.  Skin:    General: Skin is warm and dry.  Neurological:     General: No focal deficit present.     Mental Status: She is alert and oriented to person, place, and time.  Psychiatric:        Mood and Affect: Mood normal.        Behavior: Behavior normal.      Results for orders placed or performed in visit on 09/11/22  POCT CBG (Fasting - Glucose)  Result Value Ref Range   Glucose Fasting, POC 298 (A) 70 - 99 mg/dL    Recent Results (from the past 2160 hour(s))  POCT CBG (Fasting - Glucose)     Status: Abnormal   Collection Time: 08/28/22  1:12 PM  Result Value Ref Range   Glucose Fasting, POC 296 (A) 70 - 99 mg/dL  [id number]     Status: Abnormal   Collection Time: 08/28/22  1:41 PM  Result Value Ref Range   Glucose 252 (H) 70 - 99 mg/dL   BUN 34 (H) 6 - 24 mg/dL   Creatinine, Ser 4.09 0.57 - 1.00 mg/dL   eGFR 78 >81 XB/JYN/8.29   BUN/Creatinine Ratio 39 (H) 9 - 23   Sodium 139 134 - 144 mmol/L   Potassium 4.6 3.5 - 5.2 mmol/L   Chloride 106 96 - 106 mmol/L   CO2 21 20 - 29 mmol/L   Calcium 8.7 8.7 - 10.2 mg/dL   Total Protein 6.9 6.0 - 8.5 g/dL   Albumin 3.9 3.8 - 4.9 g/dL   Globulin, Total 3.0 1.5 - 4.5 g/dL   Bilirubin Total <5.6 0.0 - 1.2 mg/dL   Alkaline Phosphatase 128 (H) 44 - 121 IU/L   AST 35 0 - 40 IU/L    ALT 43 (H) 0 - 32 IU/L  Lipid Panel w/o Chol/HDL Ratio     Status: None   Collection  Time: 08/28/22  1:41 PM  Result Value Ref Range   Cholesterol, Total 107 100 - 199 mg/dL   Triglycerides 841 0 - 149 mg/dL   HDL 45 >32 mg/dL   VLDL Cholesterol Cal 25 5 - 40 mg/dL   LDL Chol Calc (NIH) 37 0 - 99 mg/dL  Hemoglobin G4W     Status: Abnormal   Collection Time: 08/28/22  1:41 PM  Result Value Ref Range   Hgb A1c MFr Bld 8.0 (H) 4.8 - 5.6 %    Comment:          Prediabetes: 5.7 - 6.4          Diabetes: >6.4          Glycemic control for adults with diabetes: <7.0    Est. average glucose Bld gHb Est-mCnc 183 mg/dL  CBC with Differential/Platelet     Status: None   Collection Time: 08/28/22  1:41 PM  Result Value Ref Range   WBC 6.9 3.4 - 10.8 x10E3/uL   RBC 4.18 3.77 - 5.28 x10E6/uL   Hemoglobin 12.2 11.1 - 15.9 g/dL   Hematocrit 10.2 72.5 - 46.6 %   MCV 89 79 - 97 fL   MCH 29.2 26.6 - 33.0 pg   MCHC 32.7 31.5 - 35.7 g/dL   RDW 36.6 44.0 - 34.7 %   Platelets 216 150 - 450 x10E3/uL   Neutrophils 54 Not Estab. %   Lymphs 34 Not Estab. %   Monocytes 9 Not Estab. %   Eos 3 Not Estab. %   Basos 0 Not Estab. %   Neutrophils Absolute 3.7 1.4 - 7.0 x10E3/uL   Lymphocytes Absolute 2.3 0.7 - 3.1 x10E3/uL   Monocytes Absolute 0.6 0.1 - 0.9 x10E3/uL   EOS (ABSOLUTE) 0.2 0.0 - 0.4 x10E3/uL   Basophils Absolute 0.0 0.0 - 0.2 x10E3/uL   Immature Granulocytes 0 Not Estab. %   Immature Grans (Abs) 0.0 0.0 - 0.1 x10E3/uL  POC CREATINE & ALBUMIN,URINE     Status: Abnormal   Collection Time: 08/28/22  1:52 PM  Result Value Ref Range   Microalbumin Ur, POC 80 mg/L   Creatinine, POC 10 mg/dL   Albumin/Creatinine Ratio, Urine, POC >300   POCT CBG (Fasting - Glucose)     Status: Abnormal   Collection Time: 09/11/22  1:44 PM  Result Value Ref Range   Glucose Fasting, POC 298 (A) 70 - 99 mg/dL      Assessment & Plan:  To start Rybelsus at 3 mg/day.  Strict diet control and exercise.   Continue all medications as such.  Follow-up in in 1 month.  If tolerated well we will increase the dose of Rybelsus to 7 mg/day. Add FiberCon daily. Problem List Items Addressed This Visit     Type 2 diabetes mellitus (HCC) - Primary   Relevant Medications   Semaglutide (RYBELSUS) 3 MG TABS   Other Relevant Orders   POCT CBG (Fasting - Glucose) (Completed)   Essential hypertension, benign   Mixed hyperlipidemia    Return in about 1 month (around 10/12/2022).   Total time spent: 30 minutes  Margaretann Loveless, MD  09/11/2022   This document may have been prepared by [facility] Voice Recognition software and as such may include unintentional dictation errors.

## 2022-09-17 ENCOUNTER — Telehealth: Payer: Self-pay | Admitting: Internal Medicine

## 2022-09-17 NOTE — Telephone Encounter (Signed)
Patient's spouse left VM that they are waiting on insurance to approve the patient's medicine so she has not started it yet.

## 2022-09-19 ENCOUNTER — Telehealth: Payer: Self-pay | Admitting: Internal Medicine

## 2022-09-19 NOTE — Telephone Encounter (Signed)
Patient's spouse called about her Rybelsus not being covered. The PA was denied for this. Need to call and let them know.

## 2022-10-02 ENCOUNTER — Other Ambulatory Visit: Payer: Self-pay | Admitting: Internal Medicine

## 2022-10-02 DIAGNOSIS — M7582 Other shoulder lesions, left shoulder: Secondary | ICD-10-CM

## 2022-10-12 ENCOUNTER — Encounter: Payer: Self-pay | Admitting: Internal Medicine

## 2022-10-12 ENCOUNTER — Ambulatory Visit: Payer: Medicaid Other | Admitting: Internal Medicine

## 2022-10-12 VITALS — BP 140/80 | HR 80 | Ht 63.0 in | Wt 198.4 lb

## 2022-10-12 DIAGNOSIS — E1165 Type 2 diabetes mellitus with hyperglycemia: Secondary | ICD-10-CM

## 2022-10-12 DIAGNOSIS — I10 Essential (primary) hypertension: Secondary | ICD-10-CM | POA: Diagnosis not present

## 2022-10-12 DIAGNOSIS — Z8673 Personal history of transient ischemic attack (TIA), and cerebral infarction without residual deficits: Secondary | ICD-10-CM | POA: Diagnosis not present

## 2022-10-12 DIAGNOSIS — E782 Mixed hyperlipidemia: Secondary | ICD-10-CM | POA: Diagnosis not present

## 2022-10-12 LAB — POCT CBG (FASTING - GLUCOSE)-MANUAL ENTRY: Glucose Fasting, POC: 157 mg/dL — AB (ref 70–99)

## 2022-10-12 NOTE — Progress Notes (Signed)
Established Patient Office Visit  Subjective:  Patient ID: Sharon Gray, female    DOB: Jun 04, 1965  Age: 57 y.o. MRN: [id number]  Chief Complaint  Patient presents with   Follow-up    1 month    Patient comes in for follow-up accompanied by her daughter.  She reports that they were not able to get Rybelsus as it was not covered by her insurance.  So currently she is only on glimepiride and [place].  She does not want to try Ozempic or insulin-she is scared of needles.  However admits to dietary noncompliance.  Her daughter was present insists that the whole family will help the patient to follow a strict diet and improve her eating habits so that her next hemoglobin A1c in 3 months will be lower. Patient agrees to the plan.    No other concerns at this time.   Past Medical History:  Diagnosis Date   Diabetes mellitus without complication (HCC)    Hypertension    Stroke [facility])     Past Surgical History:  Procedure Laterality Date   APPENDECTOMY     CAROTID PTA/STENT INTERVENTION Right 07/26/2020   Procedure: CAROTID PTA/STENT INTERVENTION;  Surgeon: Renford Dills, MD;  Location: [facility] INVASIVE CV LAB;  Service: Cardiovascular;  Laterality: Right;   LOWER EXTREMITY ANGIOGRAPHY Right 05/19/2021   Procedure: Lower Extremity Angiography;  Surgeon: Renford Dills, MD;  Location: [facility] INVASIVE CV LAB;  Service: Cardiovascular;  Laterality: Right;   NO PAST SURGERIES      Social History   Socioeconomic History   Marital status: Married    Spouse name: Energy manager   Number of children: 4   Years of education: Not on file   Highest education level: Not on file  Occupational History   Not on file  Tobacco Use   Smoking status: Never    Passive exposure: Never   Smokeless tobacco: Never  Substance and Sexual Activity   Alcohol use: Yes    Comment: socially: only at big parties (2 years ago)   Drug use: Not Currently   Sexual activity: Not on file  Other Topics Concern   Not  on file  Social History Narrative   Lives with husband and 3 kids at home    Social Determinants of Health   Financial Resource Strain: Not on file  Food Insecurity: Not on file  Transportation Needs: Not on file  Physical Activity: Not on file  Stress: Not on file  Social Connections: Not on file  Intimate Partner Violence: Not on file    Family History  Problem Relation Age of Onset   Stroke Father     No Known Allergies  Review of Systems  Constitutional: Negative.  Negative for chills, fever, malaise/fatigue and weight loss.  HENT: Negative.    Eyes: Negative.   Respiratory: Negative.  Negative for cough and shortness of breath.   Cardiovascular: Negative.  Negative for chest pain, palpitations and leg swelling.  Gastrointestinal: Negative.  Negative for abdominal pain, constipation, diarrhea, heartburn, nausea and vomiting.  Genitourinary: Negative.  Negative for dysuria and flank pain.  Musculoskeletal: Negative.  Negative for joint pain and myalgias.  Skin: Negative.   Neurological: Negative.  Negative for dizziness and headaches.  Endo/Heme/Allergies: Negative.   Psychiatric/Behavioral: Negative.  Negative for depression and suicidal ideas. The patient is not nervous/anxious.        Objective:   BP (!) 140/80   Pulse 80   Ht 5\' 3"  (1.6 m)  Wt 198 lb 6.4 oz (90 kg)   SpO2 97%   BMI 35.14 kg/m   Vitals:   10/12/22 1418  BP: (!) 140/80  Pulse: 80  Height: 5\' 3"  (1.6 m)  Weight: 198 lb 6.4 oz (90 kg)  SpO2: 97%  BMI (Calculated): 35.15    Physical Exam Vitals and nursing note reviewed.  Constitutional:      Appearance: Normal appearance.  HENT:     Head: Normocephalic and atraumatic.     Nose: Nose normal.     Mouth/Throat:     Mouth: Mucous membranes are moist.     Pharynx: Oropharynx is clear.  Eyes:     Conjunctiva/sclera: Conjunctivae normal.     Pupils: Pupils are equal, round, and reactive to light.  Cardiovascular:     Rate and  Rhythm: Normal rate and regular rhythm.     Pulses: Normal pulses.     Heart sounds: Normal heart sounds. No murmur heard. Pulmonary:     Effort: Pulmonary effort is normal.     Breath sounds: Normal breath sounds. No wheezing.  Abdominal:     General: Bowel sounds are normal.     Palpations: Abdomen is soft.     Tenderness: There is no abdominal tenderness. There is no right CVA tenderness or left CVA tenderness.  Musculoskeletal:        General: Normal range of motion.     Cervical back: Normal range of motion.     Right lower leg: No edema.     Left lower leg: No edema.  Skin:    General: Skin is warm and dry.  Neurological:     General: No focal deficit present.     Mental Status: She is alert and oriented to person, place, and time.  Psychiatric:        Mood and Affect: Mood normal.        Behavior: Behavior normal.      Results for orders placed or performed in visit on 10/12/22  POCT CBG (Fasting - Glucose)  Result Value Ref Range   Glucose Fasting, POC 157 (A) 70 - 99 mg/dL    Recent Results (from the past 2160 hour(s))  POCT CBG (Fasting - Glucose)     Status: Abnormal   Collection Time: 08/28/22  1:12 PM  Result Value Ref Range   Glucose Fasting, POC 296 (A) 70 - 99 mg/dL  [id number]     Status: Abnormal   Collection Time: 08/28/22  1:41 PM  Result Value Ref Range   Glucose 252 (H) 70 - 99 mg/dL   BUN 34 (H) 6 - 24 mg/dL   Creatinine, Ser 4.09 0.57 - 1.00 mg/dL   eGFR 78 >81 XB/JYN/8.29   BUN/Creatinine Ratio 39 (H) 9 - 23   Sodium 139 134 - 144 mmol/L   Potassium 4.6 3.5 - 5.2 mmol/L   Chloride 106 96 - 106 mmol/L   CO2 21 20 - 29 mmol/L   Calcium 8.7 8.7 - 10.2 mg/dL   Total Protein 6.9 6.0 - 8.5 g/dL   Albumin 3.9 3.8 - 4.9 g/dL   Globulin, Total 3.0 1.5 - 4.5 g/dL   Bilirubin Total <5.6 0.0 - 1.2 mg/dL   Alkaline Phosphatase 128 (H) 44 - 121 IU/L   AST 35 0 - 40 IU/L   ALT 43 (H) 0 - 32 IU/L  Lipid Panel w/o Chol/HDL Ratio     Status: None    Collection Time: 08/28/22  1:41 PM  Result Value Ref Range   Cholesterol, Total 107 100 - 199 mg/dL   Triglycerides 865 0 - 149 mg/dL   HDL 45 >78 mg/dL   VLDL Cholesterol Cal 25 5 - 40 mg/dL   LDL Chol Calc (NIH) 37 0 - 99 mg/dL  Hemoglobin I6N     Status: Abnormal   Collection Time: 08/28/22  1:41 PM  Result Value Ref Range   Hgb A1c MFr Bld 8.0 (H) 4.8 - 5.6 %    Comment:          Prediabetes: 5.7 - 6.4          Diabetes: >6.4          Glycemic control for adults with diabetes: <7.0    Est. average glucose Bld gHb Est-mCnc 183 mg/dL  CBC with Differential/Platelet     Status: None   Collection Time: 08/28/22  1:41 PM  Result Value Ref Range   WBC 6.9 3.4 - 10.8 x10E3/uL   RBC 4.18 3.77 - 5.28 x10E6/uL   Hemoglobin 12.2 11.1 - 15.9 g/dL   Hematocrit 62.9 52.8 - 46.6 %   MCV 89 79 - 97 fL   MCH 29.2 26.6 - 33.0 pg   MCHC 32.7 31.5 - 35.7 g/dL   RDW 41.3 24.4 - 01.0 %   Platelets 216 150 - 450 x10E3/uL   Neutrophils 54 Not Estab. %   Lymphs 34 Not Estab. %   Monocytes 9 Not Estab. %   Eos 3 Not Estab. %   Basos 0 Not Estab. %   Neutrophils Absolute 3.7 1.4 - 7.0 x10E3/uL   Lymphocytes Absolute 2.3 0.7 - 3.1 x10E3/uL   Monocytes Absolute 0.6 0.1 - 0.9 x10E3/uL   EOS (ABSOLUTE) 0.2 0.0 - 0.4 x10E3/uL   Basophils Absolute 0.0 0.0 - 0.2 x10E3/uL   Immature Granulocytes 0 Not Estab. %   Immature Grans (Abs) 0.0 0.0 - 0.1 x10E3/uL  POC CREATINE & ALBUMIN,URINE     Status: Abnormal   Collection Time: 08/28/22  1:52 PM  Result Value Ref Range   Microalbumin Ur, POC 80 mg/L   Creatinine, POC 10 mg/dL   Albumin/Creatinine Ratio, Urine, POC >300   POCT CBG (Fasting - Glucose)     Status: Abnormal   Collection Time: 09/11/22  1:44 PM  Result Value Ref Range   Glucose Fasting, POC 298 (A) 70 - 99 mg/dL  POCT CBG (Fasting - Glucose)     Status: Abnormal   Collection Time: 10/12/22  2:39 PM  Result Value Ref Range   Glucose Fasting, POC 157 (A) 70 - 99 mg/dL      Assessment  & Plan:  Patient and her family agree to a very strict diet plan along with exercise and weight reduction.  Will repeat her labs in 3 months. Problem List Items Addressed This Visit     Type 2 diabetes mellitus (HCC) - Primary   Relevant Orders   POCT CBG (Fasting - Glucose) (Completed)   Essential hypertension, benign   History of CVA (cerebrovascular accident)   Mixed hyperlipidemia    Return in about 3 months (around 01/11/2023).   Total time spent: 30 minutes  Margaretann Loveless, MD  10/12/2022   This document may have been prepared by [facility] Voice Recognition software and as such may include unintentional dictation errors.

## 2022-10-26 ENCOUNTER — Other Ambulatory Visit: Payer: Self-pay | Admitting: Internal Medicine

## 2022-12-04 ENCOUNTER — Other Ambulatory Visit (INDEPENDENT_AMBULATORY_CARE_PROVIDER_SITE_OTHER): Payer: Self-pay | Admitting: Nurse Practitioner

## 2022-12-04 DIAGNOSIS — I6523 Occlusion and stenosis of bilateral carotid arteries: Secondary | ICD-10-CM

## 2022-12-07 ENCOUNTER — Encounter (INDEPENDENT_AMBULATORY_CARE_PROVIDER_SITE_OTHER): Payer: Self-pay | Admitting: Nurse Practitioner

## 2022-12-07 ENCOUNTER — Ambulatory Visit (INDEPENDENT_AMBULATORY_CARE_PROVIDER_SITE_OTHER): Payer: Medicaid Other | Admitting: Nurse Practitioner

## 2022-12-07 ENCOUNTER — Ambulatory Visit (INDEPENDENT_AMBULATORY_CARE_PROVIDER_SITE_OTHER): Payer: Medicaid Other

## 2022-12-07 VITALS — BP 139/82 | HR 86 | Resp 16 | Wt 196.6 lb

## 2022-12-07 DIAGNOSIS — E1159 Type 2 diabetes mellitus with other circulatory complications: Secondary | ICD-10-CM | POA: Diagnosis not present

## 2022-12-07 DIAGNOSIS — I6523 Occlusion and stenosis of bilateral carotid arteries: Secondary | ICD-10-CM | POA: Diagnosis not present

## 2022-12-07 DIAGNOSIS — I152 Hypertension secondary to endocrine disorders: Secondary | ICD-10-CM

## 2022-12-14 ENCOUNTER — Other Ambulatory Visit: Payer: Self-pay | Admitting: Internal Medicine

## 2022-12-14 DIAGNOSIS — M7582 Other shoulder lesions, left shoulder: Secondary | ICD-10-CM

## 2022-12-17 ENCOUNTER — Encounter (INDEPENDENT_AMBULATORY_CARE_PROVIDER_SITE_OTHER): Payer: Self-pay | Admitting: Nurse Practitioner

## 2022-12-17 NOTE — Progress Notes (Signed)
Subjective:    Patient ID: Sharon Gray, female    DOB: 1965/12/09, 57 y.o.   MRN: [id number] Chief Complaint  Patient presents with   Follow-up    6 month carotid u/s follow up     Sharon Gray is a 57 year old female that is seen for follow up evaluation of carotid stenosis status post right  carotid stent on 07/26/2020.  There were no post operative problems or complications related to the surgery.  The patient denies neck or incisional pain.   The patient denies interval amaurosis fugax. There is no recent history of TIA symptoms or focal motor deficits. There is prior documented CVA.   The patient denies headache.   The patient is taking enteric-coated aspirin 81 mg daily.   The patient has a history of coronary artery disease, no recent episodes of angina or shortness of breath. The patient denies PAD or claudication symptoms.  She recently underwent angiogram due to slow healing ulceration while hospitalized and this was found to be open and patent. There is a history of hyperlipidemia which is being treated with a statin.     Today noninvasive studies shows 40 to 59% stenosis of the right ICA with 60 to 79% stenosis of the left ICA.      Review of Systems  All other systems reviewed and are negative.      Objective:   Physical Exam Vitals reviewed.  HENT:     Head: Normocephalic.  Neck:     Vascular: No carotid bruit.  Cardiovascular:     Rate and Rhythm: Normal rate.     Pulses: Normal pulses.  Pulmonary:     Effort: Pulmonary effort is normal.  Skin:    General: Skin is warm and dry.  Neurological:     Mental Status: She is alert and oriented to person, place, and time.  Psychiatric:        Mood and Affect: Mood normal.        Behavior: Behavior normal.        Thought Content: Thought content normal.        Judgment: Judgment normal.     BP 139/82 (BP Location: Right Arm)   Pulse 86   Resp 16   Wt 196 lb 9.6 oz (89.2 kg)   BMI 34.83 kg/m   Past  Medical History:  Diagnosis Date   Diabetes mellitus without complication (HCC)    Hypertension    Stroke [facility])     Social History   Socioeconomic History   Marital status: Married    Spouse name: Energy manager   Number of children: 4   Years of education: Not on file   Highest education level: Not on file  Occupational History   Not on file  Tobacco Use   Smoking status: Never    Passive exposure: Never   Smokeless tobacco: Never  Substance and Sexual Activity   Alcohol use: Yes    Comment: socially: only at big parties (2 years ago)   Drug use: Not Currently   Sexual activity: Not on file  Other Topics Concern   Not on file  Social History Narrative   Lives with husband and 3 kids at home    Social Determinants of Health   Financial Resource Strain: Not on file  Food Insecurity: Not on file  Transportation Needs: Not on file  Physical Activity: Not on file  Stress: Not on file  Social Connections: Not on file  Intimate Partner Violence: Not  on file    Past Surgical History:  Procedure Laterality Date   APPENDECTOMY     CAROTID PTA/STENT INTERVENTION Right 07/26/2020   Procedure: CAROTID PTA/STENT INTERVENTION;  Surgeon: Renford Dills, MD;  Location: [facility] INVASIVE CV LAB;  Service: Cardiovascular;  Laterality: Right;   LOWER EXTREMITY ANGIOGRAPHY Right 05/19/2021   Procedure: Lower Extremity Angiography;  Surgeon: Renford Dills, MD;  Location: [facility] INVASIVE CV LAB;  Service: Cardiovascular;  Laterality: Right;   NO PAST SURGERIES      Family History  Problem Relation Age of Onset   Stroke Father     No Known Allergies     Latest Ref Rng & Units 08/28/2022    1:41 PM 05/29/2022    8:57 AM 08/08/2021   11:20 AM  CBC  WBC 3.4 - 10.8 x10E3/uL 6.9  7.7  5.5   Hemoglobin 11.1 - 15.9 g/dL 40.9  81.1  91.4   Hematocrit 34.0 - 46.6 % 37.3  38.1  37.1   Platelets 150 - 450 x10E3/uL 216   225       CMP     Component Value Date/Time   NA 139 08/28/2022 1341    NA 137 01/27/2014 0144   K 4.6 08/28/2022 1341   K 4.1 01/27/2014 0144   CL 106 08/28/2022 1341   CL 102 01/27/2014 0144   CO2 21 08/28/2022 1341   CO2 27 01/27/2014 0144   GLUCOSE 252 (H) 08/28/2022 1341   GLUCOSE 117 (H) 08/08/2021 1120   GLUCOSE 401 (H) 01/27/2014 0144   BUN 34 (H) 08/28/2022 1341   BUN 18 01/27/2014 0144   CREATININE 0.88 08/28/2022 1341   CREATININE 0.93 01/27/2014 0144   CALCIUM 8.7 08/28/2022 1341   CALCIUM 8.4 (L) 01/27/2014 0144   PROT 6.9 08/28/2022 1341   PROT 8.1 01/27/2014 0144   ALBUMIN 3.9 08/28/2022 1341   ALBUMIN 3.7 01/27/2014 0144   AST 35 08/28/2022 1341   AST 14 (L) 01/27/2014 0144   ALT 43 (H) 08/28/2022 1341   ALT 27 01/27/2014 0144   ALKPHOS 128 (H) 08/28/2022 1341   ALKPHOS 140 (H) 01/27/2014 0144   BILITOT <0.2 08/28/2022 1341   BILITOT 0.2 01/27/2014 0144   GFRNONAA >60 08/08/2021 1120   GFRNONAA >60 01/27/2014 0144   GFRAA >60 01/27/2014 0144     No results found.     Assessment & Plan:   1. Carotid artery stenosis, symptomatic, right Recommend:  Given the patient's asymptomatic subcritical stenosis no further invasive testing or surgery at this time.  Duplex ultrasound notable growth of 40 to 59% stenosis of the right ICA with 60 to 79% stenosis of the left ICA.  Given this difference between studies on the patient follow-up in  6 months to repeat studies  Continue antiplatelet therapy as prescribed Continue management of CAD, HTN and Hyperlipidemia Healthy heart diet,  encouraged exercise at least 4 times per week Follow up in 6 months with duplex ultrasound and physical exam    2. Type 2 diabetes mellitus with other circulatory complication, unspecified whether long term insulin use (HCC) Continue hypoglycemic medications as already ordered, these medications have been reviewed and there are no changes at this time.  Hgb A1C to be monitored as already arranged by primary service   3. Hypertension associated  with diabetes (HCC) Continue antihypertensive medications as already ordered, these medications have been reviewed and there are no changes at this time.    Current Outpatient Medications on File Prior  to Visit  Medication Sig Dispense Refill   ACCU-CHEK GUIDE test strip USE TO CHECK BLOOD SUGAR THREE TIMES A DAY 100 strip 6   acetaminophen (TYLENOL) 500 MG tablet Take 500 mg by mouth every 6 (six) hours as needed for moderate pain or mild pain.     Artificial Tear Solution (TEARS NATURALE OP) Place 1 drop into both eyes daily as needed (itchy eye).     aspirin EC 81 MG EC tablet Take 1 tablet (81 mg total) by mouth daily. Swallow whole. 90 tablet 3   baclofen (LIORESAL) 10 MG tablet Take 1 tablet (10 mg total) by mouth daily as needed for muscle spasms. 30 each 0   celecoxib (CELEBREX) 200 MG capsule TAKE 1 CAPSULE BY MOUTH EVERY DAY WITH FOOD 30 capsule 2   clopidogrel (PLAVIX) 75 MG tablet TAKE 1 TABLET BY MOUTH EVERY DAY 90 tablet 3   enalapril (VASOTEC) 2.5 MG tablet TAKE 1 TABLET BY MOUTH EVERY DAY 90 tablet 3   FARXIGA 10 MG TABS tablet TAKE 1 TABLET BY MOUTH EVERY DAY 90 tablet 3   glimepiride (AMARYL) 4 MG tablet TAKE 1 TABLET BY MOUTH TWICE A DAY 180 tablet 2   losartan (COZAAR) 50 MG tablet Take 50 mg by mouth daily.     Multiple Vitamin (MULTIVITAMIN) tablet Take 1 tablet by mouth daily.     rosuvastatin (CRESTOR) 40 MG tablet TAKE 1 TABLET BY MOUTH EVERY DAY 90 tablet 3   Vitamin D, Ergocalciferol, (DRISDOL) 1.25 MG (50000 UNIT) CAPS capsule TAKE 1 CAPSULE BY MOUTH ONE TIME PER WEEK 12 capsule 3   Semaglutide (RYBELSUS) 3 MG TABS Take 1 tablet (3 mg total) by mouth daily. (Patient not taking: Reported on 10/12/2022) 30 tablet 0   No current facility-administered medications on file prior to visit.    There are no Patient Instructions on file for this visit. No follow-ups on file.   Georgiana Spinner, NP

## 2023-01-11 ENCOUNTER — Ambulatory Visit: Payer: Medicaid Other | Admitting: Internal Medicine

## 2023-01-11 ENCOUNTER — Encounter: Payer: Self-pay | Admitting: Internal Medicine

## 2023-01-11 VITALS — BP 158/87 | HR 85 | Ht 63.0 in | Wt 197.6 lb

## 2023-01-11 DIAGNOSIS — E1169 Type 2 diabetes mellitus with other specified complication: Secondary | ICD-10-CM | POA: Insufficient documentation

## 2023-01-11 DIAGNOSIS — Z8673 Personal history of transient ischemic attack (TIA), and cerebral infarction without residual deficits: Secondary | ICD-10-CM

## 2023-01-11 DIAGNOSIS — E1165 Type 2 diabetes mellitus with hyperglycemia: Secondary | ICD-10-CM | POA: Diagnosis not present

## 2023-01-11 DIAGNOSIS — I152 Hypertension secondary to endocrine disorders: Secondary | ICD-10-CM

## 2023-01-11 DIAGNOSIS — E782 Mixed hyperlipidemia: Secondary | ICD-10-CM

## 2023-01-11 DIAGNOSIS — E1159 Type 2 diabetes mellitus with other circulatory complications: Secondary | ICD-10-CM | POA: Diagnosis not present

## 2023-01-11 LAB — POCT CBG (FASTING - GLUCOSE)-MANUAL ENTRY: Glucose Fasting, POC: 250 mg/dL — AB (ref 70–99)

## 2023-01-11 NOTE — Progress Notes (Signed)
Established Patient Office Visit  Subjective:  Patient ID: Sharon Gray, female    DOB: 1965/06/08  Age: 57 y.o. MRN: [id number]  Chief Complaint  Patient presents with   Follow-up    Patient comes in for her follow-up today.  She is not accompanied by her children or husband today. She states she is feeling quite well and has been busy all day getting ready for the holidays.  Her blood pressure is high this afternoon.  She reports that she takes her blood pressure medicine at night.  She has been trying to control her diet and expects her labs to be much better at this time.  However she will return fasting for blood work. Her PHQ-9/GAD score is 8/3.  Patient does not want to start any medications, she thinks her children and her family are helping her cope with her moods.    No other concerns at this time.   Past Medical History:  Diagnosis Date   Diabetes mellitus without complication (HCC)    Hypertension    Stroke [facility])     Past Surgical History:  Procedure Laterality Date   APPENDECTOMY     CAROTID PTA/STENT INTERVENTION Right 07/26/2020   Procedure: CAROTID PTA/STENT INTERVENTION;  Surgeon: Renford Dills, MD;  Location: [facility] INVASIVE CV LAB;  Service: Cardiovascular;  Laterality: Right;   LOWER EXTREMITY ANGIOGRAPHY Right 05/19/2021   Procedure: Lower Extremity Angiography;  Surgeon: Renford Dills, MD;  Location: [facility] INVASIVE CV LAB;  Service: Cardiovascular;  Laterality: Right;   NO PAST SURGERIES      Social History   Socioeconomic History   Marital status: Married    Spouse name: Energy manager   Number of children: 4   Years of education: Not on file   Highest education level: Not on file  Occupational History   Not on file  Tobacco Use   Smoking status: Never    Passive exposure: Never   Smokeless tobacco: Never  Substance and Sexual Activity   Alcohol use: Yes    Comment: socially: only at big parties (2 years ago)   Drug use: Not Currently   Sexual  activity: Not on file  Other Topics Concern   Not on file  Social History Narrative   Lives with husband and 3 kids at home    Social Drivers of Health   Financial Resource Strain: Not on file  Food Insecurity: Not on file  Transportation Needs: Not on file  Physical Activity: Not on file  Stress: Not on file  Social Connections: Not on file  Intimate Partner Violence: Not on file    Family History  Problem Relation Age of Onset   Stroke Father     No Known Allergies  Outpatient Medications Prior to Visit  Medication Sig   ACCU-CHEK GUIDE test strip USE TO CHECK BLOOD SUGAR THREE TIMES A DAY   acetaminophen (TYLENOL) 500 MG tablet Take 500 mg by mouth every 6 (six) hours as needed for moderate pain or mild pain.   Artificial Tear Solution (TEARS NATURALE OP) Place 1 drop into both eyes daily as needed (itchy eye).   aspirin EC 81 MG EC tablet Take 1 tablet (81 mg total) by mouth daily. Swallow whole.   baclofen (LIORESAL) 10 MG tablet Take 1 tablet (10 mg total) by mouth daily as needed for muscle spasms.   celecoxib (CELEBREX) 200 MG capsule TAKE 1 CAPSULE BY MOUTH EVERY DAY WITH FOOD   clopidogrel (PLAVIX) 75 MG tablet TAKE  1 TABLET BY MOUTH EVERY DAY   enalapril (VASOTEC) 2.5 MG tablet TAKE 1 TABLET BY MOUTH EVERY DAY   FARXIGA 10 MG TABS tablet TAKE 1 TABLET BY MOUTH EVERY DAY   glimepiride (AMARYL) 4 MG tablet TAKE 1 TABLET BY MOUTH TWICE A DAY   losartan (COZAAR) 50 MG tablet Take 50 mg by mouth daily.   Multiple Vitamin (MULTIVITAMIN) tablet Take 1 tablet by mouth daily.   rosuvastatin (CRESTOR) 40 MG tablet TAKE 1 TABLET BY MOUTH EVERY DAY   Semaglutide (RYBELSUS) 3 MG TABS Take 1 tablet (3 mg total) by mouth daily. (Patient not taking: Reported on 10/12/2022)   Vitamin D, Ergocalciferol, (DRISDOL) 1.25 MG (50000 UNIT) CAPS capsule TAKE 1 CAPSULE BY MOUTH ONE TIME PER WEEK   No facility-administered medications prior to visit.    Review of Systems  Constitutional:  Negative.  Negative for diaphoresis, fever, malaise/fatigue and weight loss.  HENT:  Positive for sore throat.   Eyes: Negative.   Respiratory: Negative.  Negative for cough and shortness of breath.   Cardiovascular: Negative.  Negative for chest pain, palpitations and leg swelling.  Gastrointestinal: Negative.  Negative for abdominal pain, constipation, diarrhea, heartburn, nausea and vomiting.  Genitourinary: Negative.  Negative for dysuria and flank pain.  Musculoskeletal: Negative.  Negative for joint pain and myalgias.  Skin: Negative.   Neurological:  Positive for tingling and sensory change. Negative for dizziness, seizures and headaches.  Endo/Heme/Allergies: Negative.   Psychiatric/Behavioral: Negative.  Negative for depression and suicidal ideas. The patient is not nervous/anxious.        Objective:   BP (!) 158/87   Pulse 85   Ht 5\' 3"  (1.6 m)   Wt 197 lb 9.6 oz (89.6 kg)   SpO2 99%   BMI 35.00 kg/m   Vitals:   01/11/23 1418  BP: (!) 158/87  Pulse: 85  Height: 5\' 3"  (1.6 m)  Weight: 197 lb 9.6 oz (89.6 kg)  SpO2: 99%  BMI (Calculated): 35.01    Physical Exam Vitals and nursing note reviewed.  Constitutional:      Appearance: Normal appearance.  HENT:     Head: Normocephalic and atraumatic.     Nose: Nose normal.     Mouth/Throat:     Mouth: Mucous membranes are moist.     Pharynx: Oropharynx is clear.  Eyes:     Conjunctiva/sclera: Conjunctivae normal.     Pupils: Pupils are equal, round, and reactive to light.  Cardiovascular:     Rate and Rhythm: Normal rate and regular rhythm.     Pulses: Normal pulses.     Heart sounds: Normal heart sounds. No murmur heard. Pulmonary:     Effort: Pulmonary effort is normal.     Breath sounds: Normal breath sounds. No wheezing.  Abdominal:     General: Bowel sounds are normal.     Palpations: Abdomen is soft.     Tenderness: There is no abdominal tenderness. There is no right CVA tenderness or left CVA  tenderness.  Musculoskeletal:        General: Normal range of motion.     Cervical back: Normal range of motion.     Right lower leg: No edema.     Left lower leg: No edema.  Skin:    General: Skin is warm and dry.  Neurological:     General: No focal deficit present.     Mental Status: She is alert and oriented to person, place, and time.  Psychiatric:  Mood and Affect: Mood normal.        Behavior: Behavior normal.      Results for orders placed or performed in visit on 01/11/23  POCT CBG (Fasting - Glucose)  Result Value Ref Range   Glucose Fasting, POC 250 (A) 70 - 99 mg/dL    Recent Results (from the past 2160 hours)  POCT CBG (Fasting - Glucose)     Status: Abnormal   Collection Time: 01/11/23  2:42 PM  Result Value Ref Range   Glucose Fasting, POC 250 (A) 70 - 99 mg/dL      Assessment & Plan:  Patient had is to continue taking her medications regularly.  Monitor her blood pressure and blood sugar at home.  Fasting labs. Patient will return in [redacted] weeks along with a family member and her home medications to discuss the results. Problem List Items Addressed This Visit     Type 2 diabetes mellitus (HCC)   Relevant Orders   POCT CBG (Fasting - Glucose) (Completed)   History of CVA (cerebrovascular accident)   Poorly controlled diabetes mellitus (HCC)   Hypertension associated with diabetes (HCC) - Primary   Combined hyperlipidemia associated with type 2 diabetes mellitus (HCC)    Return in about 3 weeks (around 02/01/2023).   Total time spent: 30 minutes  Margaretann Loveless, MD  01/11/2023   This document may have been prepared by [facility] Voice Recognition software and as such may include unintentional dictation errors.

## 2023-01-30 ENCOUNTER — Other Ambulatory Visit: Payer: Medicaid Other

## 2023-01-31 LAB — CMP14+EGFR
ALT: 24 [IU]/L (ref 0–32)
AST: 21 [IU]/L (ref 0–40)
Albumin: 4 g/dL (ref 3.8–4.9)
Alkaline Phosphatase: 108 [IU]/L (ref 44–121)
BUN/Creatinine Ratio: 39 — ABNORMAL HIGH (ref 9–23)
BUN: 39 mg/dL — ABNORMAL HIGH (ref 6–24)
Bilirubin Total: 0.3 mg/dL (ref 0.0–1.2)
CO2: 22 mmol/L (ref 20–29)
Calcium: 9.1 mg/dL (ref 8.7–10.2)
Chloride: 109 mmol/L — ABNORMAL HIGH (ref 96–106)
Creatinine, Ser: 1.01 mg/dL — ABNORMAL HIGH (ref 0.57–1.00)
Globulin, Total: 2.8 g/dL (ref 1.5–4.5)
Glucose: 172 mg/dL — ABNORMAL HIGH (ref 70–99)
Potassium: 5.2 mmol/L (ref 3.5–5.2)
Sodium: 142 mmol/L (ref 134–144)
Total Protein: 6.8 g/dL (ref 6.0–8.5)
eGFR: 66 mL/min/{1.73_m2} (ref >59)

## 2023-01-31 LAB — LIPID PANEL W/O CHOL/HDL RATIO
Cholesterol, Total: 109 mg/dL (ref 100–199)
HDL: 42 mg/dL (ref >39)
LDL Chol Calc (NIH): 41 mg/dL (ref 0–99)
Triglycerides: 153 mg/dL — ABNORMAL HIGH (ref 0–149)
VLDL Cholesterol Cal: 26 mg/dL (ref 5–40)

## 2023-01-31 LAB — HEMOGLOBIN A1C
Est. average glucose Bld gHb Est-mCnc: 235 mg/dL
Hgb A1c MFr Bld: 9.8 % — ABNORMAL HIGH (ref 4.8–5.6)

## 2023-02-01 ENCOUNTER — Ambulatory Visit: Payer: Medicaid Other | Admitting: Internal Medicine

## 2023-02-01 ENCOUNTER — Encounter: Payer: Self-pay | Admitting: Internal Medicine

## 2023-02-01 VITALS — BP 162/76 | HR 73 | Ht 63.0 in | Wt 199.4 lb

## 2023-02-01 DIAGNOSIS — I152 Hypertension secondary to endocrine disorders: Secondary | ICD-10-CM | POA: Diagnosis not present

## 2023-02-01 DIAGNOSIS — E1165 Type 2 diabetes mellitus with hyperglycemia: Secondary | ICD-10-CM

## 2023-02-01 DIAGNOSIS — E1169 Type 2 diabetes mellitus with other specified complication: Secondary | ICD-10-CM | POA: Diagnosis not present

## 2023-02-01 DIAGNOSIS — E1159 Type 2 diabetes mellitus with other circulatory complications: Secondary | ICD-10-CM | POA: Diagnosis not present

## 2023-02-01 DIAGNOSIS — E782 Mixed hyperlipidemia: Secondary | ICD-10-CM

## 2023-02-01 LAB — POCT CBG (FASTING - GLUCOSE)-MANUAL ENTRY: Glucose Fasting, POC: 164 mg/dL — AB (ref 70–99)

## 2023-02-01 MED ORDER — LOSARTAN POTASSIUM 100 MG PO TABS: 100.0000 mg | ORAL_TABLET | Freq: Every day | ORAL | 11 refills | Status: AC

## 2023-02-01 NOTE — Progress Notes (Signed)
 Established Patient Office Visit  Subjective:  Patient ID: Sharon Gray, female    DOB: 06/17/1965  Age: 58 y.o. MRN: [id number]  Chief Complaint  Patient presents with   Follow-up    3 week follow up    For her follow-up and to discuss her lab results,  accompanied by her son.  Her hemoglobin A1c has jumped up to 9.8.  She can only tolerate Farxiga and glimepiride .  She is adamantly against starting insulin  at this time. Rybelsus  was not covered by her insurance, and he cannot use metformin as her creatinine is high. Patient insist that she will try harder to control her diet, and if her hemoglobin A1c is not better in 3 months then she will consider insulin . For her blood pressure control will increase her Losartan  to 100 mg/day.    No other concerns at this time.   Past Medical History:  Diagnosis Date   Diabetes mellitus without complication (HCC)    Hypertension    Stroke [facility])     Past Surgical History:  Procedure Laterality Date   APPENDECTOMY     CAROTID PTA/STENT INTERVENTION Right 07/26/2020   Procedure: CAROTID PTA/STENT INTERVENTION;  Surgeon: Jama Cordella MATSU, MD;  Location: [facility] INVASIVE CV LAB;  Service: Cardiovascular;  Laterality: Right;   LOWER EXTREMITY ANGIOGRAPHY Right 05/19/2021   Procedure: Lower Extremity Angiography;  Surgeon: Jama Cordella MATSU, MD;  Location: [facility] INVASIVE CV LAB;  Service: Cardiovascular;  Laterality: Right;   NO PAST SURGERIES      Social History   Socioeconomic History   Marital status: Married    Spouse name: Energy Manager   Number of children: 4   Years of education: Not on file   Highest education level: Not on file  Occupational History   Not on file  Tobacco Use   Smoking status: Never    Passive exposure: Never   Smokeless tobacco: Never  Substance and Sexual Activity   Alcohol use: Yes    Comment: socially: only at big parties (2 years ago)   Drug use: Not Currently   Sexual activity: Not on file  Other Topics  Concern   Not on file  Social History Narrative   Lives with husband and 3 kids at home    Social Drivers of Health   Financial Resource Strain: Not on file  Food Insecurity: Not on file  Transportation Needs: Not on file  Physical Activity: Not on file  Stress: Not on file  Social Connections: Not on file  Intimate Partner Violence: Not on file    Family History  Problem Relation Age of Onset   Stroke Father     No Known Allergies  Outpatient Medications Prior to Visit  Medication Sig   aspirin  EC 81 MG EC tablet Take 1 tablet (81 mg total) by mouth daily. Swallow whole.   clopidogrel  (PLAVIX ) 75 MG tablet TAKE 1 TABLET BY MOUTH EVERY DAY   FARXIGA 10 MG TABS tablet TAKE 1 TABLET BY MOUTH EVERY DAY   glimepiride  (AMARYL ) 4 MG tablet TAKE 1 TABLET BY MOUTH TWICE A DAY   rosuvastatin  (CRESTOR ) 40 MG tablet TAKE 1 TABLET BY MOUTH EVERY DAY   Vitamin D , Ergocalciferol , (DRISDOL ) 1.25 MG (50000 UNIT) CAPS capsule TAKE 1 CAPSULE BY MOUTH ONE TIME PER WEEK   ACCU-CHEK GUIDE test strip USE TO CHECK BLOOD SUGAR THREE TIMES A DAY (Patient not taking: Reported on 02/01/2023)   acetaminophen  (TYLENOL ) 500 MG tablet Take 500 mg by mouth every  6 (six) hours as needed for moderate pain or mild pain. (Patient not taking: Reported on 02/01/2023)   Artificial Tear Solution (TEARS NATURALE OP) Place 1 drop into both eyes daily as needed (itchy eye). (Patient not taking: Reported on 02/01/2023)   baclofen  (LIORESAL ) 10 MG tablet Take 1 tablet (10 mg total) by mouth daily as needed for muscle spasms. (Patient not taking: Reported on 02/01/2023)   celecoxib  (CELEBREX ) 200 MG capsule TAKE 1 CAPSULE BY MOUTH EVERY DAY WITH FOOD (Patient not taking: Reported on 02/01/2023)   enalapril (VASOTEC) 2.5 MG tablet TAKE 1 TABLET BY MOUTH EVERY DAY (Patient not taking: Reported on 02/01/2023)   Multiple Vitamin (MULTIVITAMIN) tablet Take 1 tablet by mouth daily. (Patient not taking: Reported on 02/01/2023)    Semaglutide  (RYBELSUS ) 3 MG TABS Take 1 tablet (3 mg total) by mouth daily. (Patient not taking: Reported on 02/01/2023)   [DISCONTINUED] losartan  (COZAAR ) 50 MG tablet Take 50 mg by mouth daily. (Patient not taking: Reported on 02/01/2023)   No facility-administered medications prior to visit.    Review of Systems  Constitutional: Negative.  Negative for chills, fever, malaise/fatigue and weight loss.  HENT: Negative.    Eyes: Negative.   Respiratory: Negative.  Negative for cough and shortness of breath.   Cardiovascular: Negative.  Negative for chest pain, palpitations and leg swelling.  Gastrointestinal: Negative.  Negative for abdominal pain, constipation, diarrhea, heartburn, nausea and vomiting.  Genitourinary: Negative.  Negative for dysuria and flank pain.  Musculoskeletal: Negative.  Negative for joint pain and myalgias.  Skin: Negative.   Neurological: Negative.  Negative for dizziness and headaches.  Endo/Heme/Allergies: Negative.   Psychiatric/Behavioral: Negative.  Negative for depression and suicidal ideas. The patient is not nervous/anxious.        Objective:   BP (!) 162/76   Pulse 73   Ht 5' 3 (1.6 m)   Wt 199 lb 6.4 oz (90.4 kg)   SpO2 99%   BMI 35.32 kg/m   Vitals:   02/01/23 1322  BP: (!) 162/76  Pulse: 73  Height: 5' 3 (1.6 m)  Weight: 199 lb 6.4 oz (90.4 kg)  SpO2: 99%  BMI (Calculated): 35.33    Physical Exam Vitals and nursing note reviewed.  Constitutional:      Appearance: Normal appearance.  HENT:     Head: Normocephalic and atraumatic.     Nose: Nose normal.     Mouth/Throat:     Mouth: Mucous membranes are moist.     Pharynx: Oropharynx is clear.  Eyes:     Conjunctiva/sclera: Conjunctivae normal.     Pupils: Pupils are equal, round, and reactive to light.  Cardiovascular:     Rate and Rhythm: Normal rate and regular rhythm.     Pulses: Normal pulses.     Heart sounds: Normal heart sounds. No murmur heard. Pulmonary:      Effort: Pulmonary effort is normal.     Breath sounds: Normal breath sounds. No wheezing.  Abdominal:     General: Bowel sounds are normal.     Palpations: Abdomen is soft.     Tenderness: There is no abdominal tenderness. There is no right CVA tenderness or left CVA tenderness.  Musculoskeletal:        General: Normal range of motion.     Cervical back: Normal range of motion.     Right lower leg: No edema.     Left lower leg: No edema.  Skin:    General: Skin is warm  and dry.  Neurological:     General: No focal deficit present.     Mental Status: She is alert and oriented to person, place, and time.  Psychiatric:        Mood and Affect: Mood normal.        Behavior: Behavior normal.      Results for orders placed or performed in visit on 02/01/23  POCT CBG (Fasting - Glucose)  Result Value Ref Range   Glucose Fasting, POC 164 (A) 70 - 99 mg/dL    Recent Results (from the past 2160 hours)  POCT CBG (Fasting - Glucose)     Status: Abnormal   Collection Time: 01/11/23  2:42 PM  Result Value Ref Range   Glucose Fasting, POC 250 (A) 70 - 99 mg/dL  Lipid Panel w/o Chol/HDL Ratio     Status: Abnormal   Collection Time: 01/30/23  9:15 AM  Result Value Ref Range   Cholesterol, Total 109 100 - 199 mg/dL   Triglycerides 846 (H) 0 - 149 mg/dL   HDL 42 >60 mg/dL   VLDL Cholesterol Cal 26 5 - 40 mg/dL   LDL Chol Calc (NIH) 41 0 - 99 mg/dL  [id number]     Status: Abnormal   Collection Time: 01/30/23  9:15 AM  Result Value Ref Range   Glucose 172 (H) 70 - 99 mg/dL   BUN 39 (H) 6 - 24 mg/dL   Creatinine, Ser 8.98 (H) 0.57 - 1.00 mg/dL   eGFR 66 >40 fO/fpw/8.26   BUN/Creatinine Ratio 39 (H) 9 - 23   Sodium 142 134 - 144 mmol/L   Potassium 5.2 3.5 - 5.2 mmol/L   Chloride 109 (H) 96 - 106 mmol/L   CO2 22 20 - 29 mmol/L   Calcium  9.1 8.7 - 10.2 mg/dL   Total Protein 6.8 6.0 - 8.5 g/dL   Albumin 4.0 3.8 - 4.9 g/dL   Globulin, Total 2.8 1.5 - 4.5 g/dL   Bilirubin Total 0.3 0.0 -  1.2 mg/dL   Alkaline Phosphatase 108 44 - 121 IU/L   AST 21 0 - 40 IU/L   ALT 24 0 - 32 IU/L  Hemoglobin A1c     Status: Abnormal   Collection Time: 01/30/23  9:15 AM  Result Value Ref Range   Hgb A1c MFr Bld 9.8 (H) 4.8 - 5.6 %    Comment:          Prediabetes: 5.7 - 6.4          Diabetes: >6.4          Glycemic control for adults with diabetes: <7.0    Est. average glucose Bld gHb Est-mCnc 235 mg/dL  POCT CBG (Fasting - Glucose)     Status: Abnormal   Collection Time: 02/01/23  1:29 PM  Result Value Ref Range   Glucose Fasting, POC 164 (A) 70 - 99 mg/dL      Assessment & Plan:  Losartan  200 mg to improve blood pressure control.  Strict diet control emphasized. Problem List Items Addressed This Visit     Type 2 diabetes mellitus (HCC)   Relevant Medications   losartan  (COZAAR ) 100 MG tablet   Other Relevant Orders   POCT CBG (Fasting - Glucose) (Completed)   Poorly controlled diabetes mellitus (HCC)   Relevant Medications   losartan  (COZAAR ) 100 MG tablet   Hypertension associated with diabetes (HCC) - Primary   Relevant Medications   losartan  (COZAAR ) 100 MG tablet   Combined hyperlipidemia  associated with type 2 diabetes mellitus (HCC)   Relevant Medications   losartan  (COZAAR ) 100 MG tablet    Return in about 3 months (around 05/02/2023).   Total time spent: 30 minutes  FERNAND FREDY RAMAN, MD  02/01/2023   This document may have been prepared by [facility] Voice Recognition software and as such may include unintentional dictation errors.

## 2023-04-01 ENCOUNTER — Other Ambulatory Visit (INDEPENDENT_AMBULATORY_CARE_PROVIDER_SITE_OTHER): Payer: Self-pay | Admitting: Nurse Practitioner

## 2023-04-17 ENCOUNTER — Ambulatory Visit (INDEPENDENT_AMBULATORY_CARE_PROVIDER_SITE_OTHER): Admitting: Family

## 2023-04-17 DIAGNOSIS — R300 Dysuria: Secondary | ICD-10-CM | POA: Diagnosis not present

## 2023-04-17 DIAGNOSIS — R350 Frequency of micturition: Secondary | ICD-10-CM

## 2023-04-17 LAB — POCT URINALYSIS DIPSTICK
Bilirubin, UA: NEGATIVE
Glucose, UA: POSITIVE — AB
Ketones, UA: NEGATIVE
Nitrite, UA: POSITIVE
Protein, UA: POSITIVE — AB
Spec Grav, UA: 1.015 (ref 1.010–1.025)
Urobilinogen, UA: 0.2 U/dL
pH, UA: 5.5 (ref 5.0–8.0)

## 2023-04-17 MED ORDER — NITROFURANTOIN MONOHYD MACRO 100 MG PO CAPS: 100.0000 mg | ORAL_CAPSULE | Freq: Two times a day (BID) | ORAL | 0 refills | Status: AC

## 2023-04-17 NOTE — Progress Notes (Signed)
   CHIEF COMPLAINT  UA/ only visit fot UTI     REASON FOR VISIT  Possible UTI, UA Visit Only      ASSESSMENT & PLAN Diagnoses and all orders for this visit:  Urinary frequency -     POCT Urinalysis Dipstick (81002) -     Urinalysis, Routine w reflex microscopic -     Urine Culture  Dysuria -     Urinalysis, Routine w reflex microscopic -     Urine Culture  Other orders -     nitrofurantoin, macrocrystal-monohydrate, (MACROBID) 100 MG capsule; Take 1 capsule (100 mg total) by mouth 2 (two) times daily for 5 days.     Patient notified.  Total time spent: 5 minutes  Miki Kins, FNP 04/17/2023

## 2023-04-18 LAB — URINALYSIS, ROUTINE W REFLEX MICROSCOPIC
Bilirubin, UA: NEGATIVE
Ketones, UA: NEGATIVE
Nitrite, UA: POSITIVE — AB
Specific Gravity, UA: >=1.03 — AB (ref 1.005–1.030)
Urobilinogen, Ur: 0.2 mg/dL (ref 0.2–1.0)
pH, UA: 6 (ref 5.0–7.5)

## 2023-04-18 LAB — MICROSCOPIC EXAMINATION
Casts: NONE SEEN /LPF
RBC, Urine: >30 /HPF — AB (ref 0–2)
WBC, UA: >30 /HPF — AB (ref 0–5)

## 2023-04-20 LAB — URINE CULTURE

## 2023-04-30 ENCOUNTER — Other Ambulatory Visit: Payer: Self-pay | Admitting: Internal Medicine

## 2023-04-30 ENCOUNTER — Telehealth: Payer: Self-pay | Admitting: Internal Medicine

## 2023-04-30 DIAGNOSIS — E1169 Type 2 diabetes mellitus with other specified complication: Secondary | ICD-10-CM

## 2023-04-30 DIAGNOSIS — E1165 Type 2 diabetes mellitus with hyperglycemia: Secondary | ICD-10-CM

## 2023-04-30 DIAGNOSIS — Z8673 Personal history of transient ischemic attack (TIA), and cerebral infarction without residual deficits: Secondary | ICD-10-CM

## 2023-04-30 DIAGNOSIS — E559 Vitamin D deficiency, unspecified: Secondary | ICD-10-CM

## 2023-04-30 DIAGNOSIS — E1159 Type 2 diabetes mellitus with other circulatory complications: Secondary | ICD-10-CM

## 2023-04-30 NOTE — Telephone Encounter (Signed)
 Patient's husband left VM to see if she needs labs prior to her appointment on 4/9? Please advise.

## 2023-05-02 ENCOUNTER — Ambulatory Visit: Payer: Medicaid Other | Admitting: Internal Medicine

## 2023-05-02 ENCOUNTER — Encounter: Payer: Self-pay | Admitting: Internal Medicine

## 2023-05-02 VITALS — BP 138/80 | HR 84 | Ht 63.0 in | Wt 187.0 lb

## 2023-05-02 DIAGNOSIS — E559 Vitamin D deficiency, unspecified: Secondary | ICD-10-CM

## 2023-05-02 DIAGNOSIS — Z1231 Encounter for screening mammogram for malignant neoplasm of breast: Secondary | ICD-10-CM

## 2023-05-02 DIAGNOSIS — E1165 Type 2 diabetes mellitus with hyperglycemia: Secondary | ICD-10-CM | POA: Diagnosis not present

## 2023-05-02 DIAGNOSIS — E1159 Type 2 diabetes mellitus with other circulatory complications: Secondary | ICD-10-CM | POA: Diagnosis not present

## 2023-05-02 DIAGNOSIS — E1169 Type 2 diabetes mellitus with other specified complication: Secondary | ICD-10-CM

## 2023-05-02 DIAGNOSIS — E782 Mixed hyperlipidemia: Secondary | ICD-10-CM

## 2023-05-02 DIAGNOSIS — Z8673 Personal history of transient ischemic attack (TIA), and cerebral infarction without residual deficits: Secondary | ICD-10-CM

## 2023-05-02 DIAGNOSIS — I152 Hypertension secondary to endocrine disorders: Secondary | ICD-10-CM

## 2023-05-02 LAB — GLUCOSE, POCT (MANUAL RESULT ENTRY): POC Glucose: 262 mg/dL — AB (ref 70–99)

## 2023-05-02 NOTE — Progress Notes (Signed)
 Established Patient Office Visit  Subjective:  Patient ID: Sharon Gray, female    DOB: Apr 07, 1965  Age: 58 y.o. MRN: [id number]  Chief Complaint  Patient presents with   Follow-up    3 month    Comes in for her follow-up accompanied by her husband.  She has been controlling her diet and has lost some weight as well.  Generally she is feeling better.  She did not get her lab work done yet but will get it done tomorrow morning.    No other concerns at this time.   Past Medical History:  Diagnosis Date   Diabetes mellitus without complication (HCC)    Hypertension    Stroke [facility])     Past Surgical History:  Procedure Laterality Date   APPENDECTOMY     CAROTID PTA/STENT INTERVENTION Right 07/26/2020   Procedure: CAROTID PTA/STENT INTERVENTION;  Surgeon: Renford Dills, MD;  Location: [facility] INVASIVE CV LAB;  Service: Cardiovascular;  Laterality: Right;   LOWER EXTREMITY ANGIOGRAPHY Right 05/19/2021   Procedure: Lower Extremity Angiography;  Surgeon: Renford Dills, MD;  Location: [facility] INVASIVE CV LAB;  Service: Cardiovascular;  Laterality: Right;   NO PAST SURGERIES      Social History   Socioeconomic History   Marital status: Married    Spouse name: Energy manager   Number of children: 4   Years of education: Not on file   Highest education level: Not on file  Occupational History   Not on file  Tobacco Use   Smoking status: Never    Passive exposure: Never   Smokeless tobacco: Never  Substance and Sexual Activity   Alcohol use: Yes    Comment: socially: only at big parties (2 years ago)   Drug use: Not Currently   Sexual activity: Not on file  Other Topics Concern   Not on file  Social History Narrative   Lives with husband and 3 kids at home    Social Drivers of Health   Financial Resource Strain: Not on file  Food Insecurity: Not on file  Transportation Needs: Not on file  Physical Activity: Not on file  Stress: Not on file  Social Connections: Not on file   Intimate Partner Violence: Not on file    Family History  Problem Relation Age of Onset   Stroke Father     Allergies  Allergen Reactions   Semaglutide     Outpatient Medications Prior to Visit  Medication Sig   ACCU-CHEK GUIDE test strip USE TO CHECK BLOOD SUGAR THREE TIMES A DAY   aspirin EC 81 MG EC tablet Take 1 tablet (81 mg total) by mouth daily. Swallow whole.   clopidogrel (PLAVIX) 75 MG tablet TAKE 1 TABLET BY MOUTH EVERY DAY   FARXIGA 10 MG TABS tablet TAKE 1 TABLET BY MOUTH EVERY DAY   glimepiride (AMARYL) 4 MG tablet TAKE 1 TABLET BY MOUTH TWICE A DAY   losartan (COZAAR) 100 MG tablet Take 1 tablet (100 mg total) by mouth daily.   Multiple Vitamin (MULTIVITAMIN) tablet Take 1 tablet by mouth daily.   rosuvastatin (CRESTOR) 40 MG tablet TAKE 1 TABLET BY MOUTH EVERY DAY   Vitamin D, Ergocalciferol, (DRISDOL) 1.25 MG (50000 UNIT) CAPS capsule TAKE 1 CAPSULE BY MOUTH ONE TIME PER WEEK   acetaminophen (TYLENOL) 500 MG tablet Take 500 mg by mouth every 6 (six) hours as needed for moderate pain or mild pain. (Patient not taking: Reported on 02/01/2023)   Artificial Tear Solution (TEARS NATURALE  OP) Place 1 drop into both eyes daily as needed (itchy eye). (Patient not taking: Reported on 02/01/2023)   baclofen (LIORESAL) 10 MG tablet Take 1 tablet (10 mg total) by mouth daily as needed for muscle spasms. (Patient not taking: Reported on 05/02/2023)   celecoxib (CELEBREX) 200 MG capsule TAKE 1 CAPSULE BY MOUTH EVERY DAY WITH FOOD (Patient not taking: Reported on 05/02/2023)   enalapril (VASOTEC) 2.5 MG tablet TAKE 1 TABLET BY MOUTH EVERY DAY (Patient not taking: Reported on 05/02/2023)   Semaglutide (RYBELSUS) 3 MG TABS Take 1 tablet (3 mg total) by mouth daily. (Patient not taking: Reported on 10/12/2022)   No facility-administered medications prior to visit.    Review of Systems  Constitutional: Negative.  Negative for chills, fever and weight loss.  HENT: Negative.  Negative for  congestion, sinus pain and sore throat.   Eyes:  Negative for blurred vision.  Respiratory: Negative.  Negative for cough and shortness of breath.   Cardiovascular: Negative.  Negative for chest pain, palpitations and leg swelling.  Gastrointestinal: Negative.  Negative for abdominal pain, constipation, diarrhea, heartburn, nausea and vomiting.  Genitourinary: Negative.  Negative for dysuria and flank pain.  Musculoskeletal: Negative.  Negative for joint pain and myalgias.  Skin: Negative.   Neurological: Negative.  Negative for dizziness and headaches.  Endo/Heme/Allergies: Negative.   Psychiatric/Behavioral: Negative.  Negative for depression and suicidal ideas. The patient is not nervous/anxious.        Objective:   BP 138/80   Pulse 84   Ht 5\' 3"  (1.6 m)   Wt 187 lb (84.8 kg)   SpO2 98%   BMI 33.13 kg/m   Vitals:   05/02/23 1341  BP: 138/80  Pulse: 84  Height: 5\' 3"  (1.6 m)  Weight: 187 lb (84.8 kg)  SpO2: 98%  BMI (Calculated): 33.13    Physical Exam Vitals and nursing note reviewed.  Constitutional:      Appearance: Normal appearance.  HENT:     Head: Normocephalic and atraumatic.     Nose: Nose normal.     Mouth/Throat:     Mouth: Mucous membranes are moist.     Pharynx: Oropharynx is clear.  Eyes:     Conjunctiva/sclera: Conjunctivae normal.     Pupils: Pupils are equal, round, and reactive to light.  Cardiovascular:     Rate and Rhythm: Normal rate and regular rhythm.     Pulses: Normal pulses.     Heart sounds: Normal heart sounds. No murmur heard. Pulmonary:     Effort: Pulmonary effort is normal.     Breath sounds: Normal breath sounds. No wheezing.  Abdominal:     General: Bowel sounds are normal.     Palpations: Abdomen is soft.     Tenderness: There is no abdominal tenderness. There is no right CVA tenderness or left CVA tenderness.  Musculoskeletal:        General: Normal range of motion.     Cervical back: Normal range of motion.      Right lower leg: No edema.     Left lower leg: No edema.  Skin:    General: Skin is warm and dry.  Neurological:     General: No focal deficit present.     Mental Status: She is alert and oriented to person, place, and time.  Psychiatric:        Mood and Affect: Mood normal.        Behavior: Behavior normal.  Results for orders placed or performed in visit on 05/02/23  POCT Glucose (CBG)  Result Value Ref Range   POC Glucose 262 (A) 70 - 99 mg/dl    Recent Results (from the past 2160 hours)  POCT Urinalysis Dipstick (57846)     Status: Abnormal   Collection Time: 04/17/23 11:41 AM  Result Value Ref Range   Color, UA YELLOW    Clarity, UA CLOUDY    Glucose, UA Positive (A) Negative   Bilirubin, UA NEGATIVE    Ketones, UA NEGATIVE    Spec Grav, UA 1.015 1.010 - 1.025   Blood, UA 3+    pH, UA 5.5 5.0 - 8.0   Protein, UA Positive (A) Negative   Urobilinogen, UA 0.2 0.2 or 1.0 E.U./dL   Nitrite, UA POSITIVE    Leukocytes, UA Small (1+) (A) Negative   Appearance CLOUDY    Odor N/A   Urinalysis, Routine w reflex microscopic     Status: Abnormal   Collection Time: 04/17/23  1:23 PM  Result Value Ref Range   Specific Gravity, UA      >=1.030 (A) 1.005 - 1.030   pH, UA 6.0 5.0 - 7.5   Color, UA Yellow Yellow   Appearance Ur Cloudy (A) Clear   Leukocytes,UA 2+ (A) Negative   Protein,UA 2+ (A) Negative/Trace   Glucose, UA 3+ (A) Negative   Ketones, UA Negative Negative   RBC, UA 3+ (A) Negative   Bilirubin, UA Negative Negative   Urobilinogen, Ur 0.2 0.2 - 1.0 mg/dL   Nitrite, UA Positive (A) Negative   Microscopic Examination See below:     Comment: Microscopic was indicated and was performed.  Microscopic Examination     Status: Abnormal   Collection Time: 04/17/23  1:23 PM  Result Value Ref Range   WBC, UA >30 (A) 0 - 5 /hpf    Comment: Clumps of leukocytes present.   RBC, Urine >30 (A) 0 - 2 /hpf   Epithelial Cells (non renal) 0-10 0 - 10 /hpf   Casts None  seen None seen /lpf   Bacteria, UA Many (A) None seen/Few  Urine Culture     Status: Abnormal   Collection Time: 04/17/23  1:35 PM   Specimen: Urine, Clean Catch   UR  Result Value Ref Range   Urine Culture, Routine Final report (A)    Organism ID, Bacteria Escherichia coli (A)     Comment: Cefazolin with an MIC <=16 predicts susceptibility to the oral agents cefaclor, cefdinir, cefpodoxime, cefprozil, cefuroxime, cephalexin, and loracarbef when used for therapy of uncomplicated urinary tract infections due to E. coli, Klebsiella pneumoniae, and Proteus mirabilis. Greater than 100,000 colony forming units per mL    Antimicrobial Susceptibility Comment     Comment:       ** S = Susceptible; I = Intermediate; R = Resistant **                    P = Positive; N = Negative             MICS are expressed in micrograms per mL    Antibiotic                 RSLT#1    RSLT#2    RSLT#3    RSLT#4 Amoxicillin/Clavulanic Acid    S Ampicillin                     R Cefazolin  S Cefepime                       S Cefoxitin                      S Cefpodoxime                    S Ceftriaxone                    S Ciprofloxacin                  S Ertapenem                      S Gentamicin                     S Levofloxacin                   S Meropenem                      S Nitrofurantoin                 S Piperacillin/Tazobactam        S Tetracycline                   S Tobramycin                     S Trimethoprim/Sulfa             S   POCT Glucose (CBG)     Status: Abnormal   Collection Time: 05/02/23  1:55 PM  Result Value Ref Range   POC Glucose 262 (A) 70 - 99 mg/dl      Assessment & Plan:  Check labs in the morning.  Continue current medications.  Will call with results. Problem List Items Addressed This Visit     Type 2 diabetes mellitus (HCC)   Relevant Orders   POCT Glucose (CBG) (Completed)   History of CVA (cerebrovascular accident)   Breast cancer  screening by mammogram   Relevant Orders   MM 3D SCREENING MAMMOGRAM BILATERAL BREAST   Hypertension associated with diabetes (HCC) - Primary   Combined hyperlipidemia associated with type 2 diabetes mellitus (HCC)   Other Visit Diagnoses       Vitamin D deficiency           Return in about 3 months (around 08/01/2023).   Total time spent: 30 minutes  Margaretann Loveless, MD  05/02/2023   This document may have been prepared by [facility] Voice Recognition software and as such may include unintentional dictation errors.

## 2023-05-06 ENCOUNTER — Other Ambulatory Visit

## 2023-05-07 ENCOUNTER — Other Ambulatory Visit: Payer: Self-pay | Admitting: Internal Medicine

## 2023-05-07 DIAGNOSIS — E1165 Type 2 diabetes mellitus with hyperglycemia: Secondary | ICD-10-CM

## 2023-05-07 LAB — CBC WITH DIFFERENTIAL/PLATELET
Basophils Absolute: 0 10*3/uL (ref 0.0–0.2)
Basos: 1 %
EOS (ABSOLUTE): 0.3 10*3/uL (ref 0.0–0.4)
Eos: 4 %
Hematocrit: 36.3 % (ref 34.0–46.6)
Hemoglobin: 12.1 g/dL (ref 11.1–15.9)
Immature Grans (Abs): 0 10*3/uL (ref 0.0–0.1)
Immature Granulocytes: 0 %
Lymphocytes Absolute: 3.3 10*3/uL — ABNORMAL HIGH (ref 0.7–3.1)
Lymphs: 40 %
MCH: 28.9 pg (ref 26.6–33.0)
MCHC: 33.3 g/dL (ref 31.5–35.7)
MCV: 87 fL (ref 79–97)
Monocytes Absolute: 0.7 10*3/uL (ref 0.1–0.9)
Monocytes: 8 %
Neutrophils Absolute: 3.8 10*3/uL (ref 1.4–7.0)
Neutrophils: 47 %
Platelets: 218 10*3/uL (ref 150–450)
RBC: 4.18 x10E6/uL (ref 3.77–5.28)
RDW: 12.1 % (ref 11.7–15.4)
WBC: 8.1 10*3/uL (ref 3.4–10.8)

## 2023-05-07 LAB — CMP14+EGFR
ALT: 15 IU/L (ref 0–32)
AST: 19 IU/L (ref 0–40)
Albumin: 4.1 g/dL (ref 3.8–4.9)
Alkaline Phosphatase: 72 IU/L (ref 44–121)
BUN/Creatinine Ratio: 27 — ABNORMAL HIGH (ref 9–23)
BUN: 29 mg/dL — ABNORMAL HIGH (ref 6–24)
Bilirubin Total: 0.3 mg/dL (ref 0.0–1.2)
CO2: 21 mmol/L (ref 20–29)
Calcium: 9.2 mg/dL (ref 8.7–10.2)
Chloride: 104 mmol/L (ref 96–106)
Creatinine, Ser: 1.06 mg/dL — ABNORMAL HIGH (ref 0.57–1.00)
Globulin, Total: 2.9 g/dL (ref 1.5–4.5)
Glucose: 132 mg/dL — ABNORMAL HIGH (ref 70–99)
Potassium: 4.5 mmol/L (ref 3.5–5.2)
Sodium: 142 mmol/L (ref 134–144)
Total Protein: 7 g/dL (ref 6.0–8.5)
eGFR: 62 mL/min/{1.73_m2} (ref >59)

## 2023-05-07 LAB — LIPID PANEL W/O CHOL/HDL RATIO
Cholesterol, Total: 95 mg/dL — ABNORMAL LOW (ref 100–199)
HDL: 36 mg/dL — ABNORMAL LOW (ref >39)
LDL Chol Calc (NIH): 40 mg/dL (ref 0–99)
Triglycerides: 102 mg/dL (ref 0–149)
VLDL Cholesterol Cal: 19 mg/dL (ref 5–40)

## 2023-05-07 LAB — HEMOGLOBIN A1C
Est. average glucose Bld gHb Est-mCnc: 232 mg/dL
Hgb A1c MFr Bld: 9.7 % — ABNORMAL HIGH (ref 4.8–5.6)

## 2023-05-07 LAB — VITAMIN D 25 HYDROXY (VIT D DEFICIENCY, FRACTURES): Vit D, 25-Hydroxy: 55.6 ng/mL (ref 30.0–100.0)

## 2023-06-05 ENCOUNTER — Ambulatory Visit (INDEPENDENT_AMBULATORY_CARE_PROVIDER_SITE_OTHER): Payer: Medicaid Other | Admitting: Nurse Practitioner

## 2023-06-05 ENCOUNTER — Encounter (INDEPENDENT_AMBULATORY_CARE_PROVIDER_SITE_OTHER): Payer: Medicaid Other

## 2023-06-11 ENCOUNTER — Encounter (INDEPENDENT_AMBULATORY_CARE_PROVIDER_SITE_OTHER): Payer: Self-pay

## 2023-06-19 ENCOUNTER — Other Ambulatory Visit (INDEPENDENT_AMBULATORY_CARE_PROVIDER_SITE_OTHER): Payer: Self-pay | Admitting: Nurse Practitioner

## 2023-06-19 DIAGNOSIS — I6523 Occlusion and stenosis of bilateral carotid arteries: Secondary | ICD-10-CM

## 2023-06-25 ENCOUNTER — Encounter (INDEPENDENT_AMBULATORY_CARE_PROVIDER_SITE_OTHER): Payer: Self-pay | Admitting: Nurse Practitioner

## 2023-06-25 ENCOUNTER — Ambulatory Visit (INDEPENDENT_AMBULATORY_CARE_PROVIDER_SITE_OTHER)

## 2023-06-25 ENCOUNTER — Ambulatory Visit (INDEPENDENT_AMBULATORY_CARE_PROVIDER_SITE_OTHER): Admitting: Nurse Practitioner

## 2023-06-25 VITALS — BP 116/71 | HR 85 | Resp 18 | Wt 184.6 lb

## 2023-06-25 DIAGNOSIS — E1159 Type 2 diabetes mellitus with other circulatory complications: Secondary | ICD-10-CM

## 2023-06-25 DIAGNOSIS — I6523 Occlusion and stenosis of bilateral carotid arteries: Secondary | ICD-10-CM | POA: Diagnosis not present

## 2023-06-25 DIAGNOSIS — I6521 Occlusion and stenosis of right carotid artery: Secondary | ICD-10-CM | POA: Diagnosis not present

## 2023-06-25 DIAGNOSIS — I152 Hypertension secondary to endocrine disorders: Secondary | ICD-10-CM

## 2023-06-26 ENCOUNTER — Encounter (INDEPENDENT_AMBULATORY_CARE_PROVIDER_SITE_OTHER): Payer: Self-pay | Admitting: Nurse Practitioner

## 2023-06-26 NOTE — Progress Notes (Signed)
 Subjective:    Patient ID: Sharon Gray, female    DOB: Apr 15, 1965, 58 y.o.   MRN: [id number] Chief Complaint  Patient presents with   Follow-up    6 month carotid follow up     Sharon Gray is a 58 year old female that is seen for follow up evaluation of carotid stenosis status post right  carotid stent on 07/26/2020.  There were no post operative problems or complications related to the surgery.  The patient denies neck or incisional pain.   The patient denies interval amaurosis fugax. There is no recent history of TIA symptoms or focal motor deficits. There is prior documented CVA.   The patient denies headache.   The patient is taking enteric-coated aspirin  81 mg daily.   The patient has a history of coronary artery disease, no recent episodes of angina or shortness of breath. The patient denies PAD or claudication symptoms.   Today noninvasive studies shows 1 to 39% stenosis of the right ICA with 40 to 59% stenosis of the left ICA.      Review of Systems  All other systems reviewed and are negative.      Objective:   Physical Exam Vitals reviewed.  HENT:     Head: Normocephalic.  Neck:     Vascular: No carotid bruit.  Cardiovascular:     Rate and Rhythm: Normal rate.     Pulses: Normal pulses.  Pulmonary:     Effort: Pulmonary effort is normal.  Skin:    General: Skin is warm and dry.  Neurological:     Mental Status: She is alert and oriented to person, place, and time.  Psychiatric:        Mood and Affect: Mood normal.        Behavior: Behavior normal.        Thought Content: Thought content normal.        Judgment: Judgment normal.     BP 116/71   Pulse 85   Resp 18   Wt 184 lb 9.6 oz (83.7 kg)   BMI 32.70 kg/m   Past Medical History:  Diagnosis Date   Diabetes mellitus without complication (HCC)    Hypertension    Stroke [facility])     Social History   Socioeconomic History   Marital status: Married    Spouse name: Energy manager   Number of  children: 4   Years of education: Not on file   Highest education level: Not on file  Occupational History   Not on file  Tobacco Use   Smoking status: Never    Passive exposure: Never   Smokeless tobacco: Never  Substance and Sexual Activity   Alcohol use: Yes    Comment: socially: only at big parties (2 years ago)   Drug use: Not Currently   Sexual activity: Not on file  Other Topics Concern   Not on file  Social History Narrative   Lives with husband and 3 kids at home    Social Drivers of Health   Financial Resource Strain: Not on file  Food Insecurity: Not on file  Transportation Needs: Not on file  Physical Activity: Not on file  Stress: Not on file  Social Connections: Not on file  Intimate Partner Violence: Not on file    Past Surgical History:  Procedure Laterality Date   APPENDECTOMY     CAROTID PTA/STENT INTERVENTION Right 07/26/2020   Procedure: CAROTID PTA/STENT INTERVENTION;  Surgeon: Prescilla Brod, Ninette Basque, MD;  Location: [facility] INVASIVE CV  LAB;  Service: Cardiovascular;  Laterality: Right;   LOWER EXTREMITY ANGIOGRAPHY Right 05/19/2021   Procedure: Lower Extremity Angiography;  Surgeon: Jackquelyn Mass, MD;  Location: [facility] INVASIVE CV LAB;  Service: Cardiovascular;  Laterality: Right;   NO PAST SURGERIES      Family History  Problem Relation Age of Onset   Stroke Father     Allergies  Allergen Reactions   Semaglutide         Latest Ref Rng & Units 05/06/2023    9:05 AM 08/28/2022    1:41 PM 05/29/2022    8:57 AM  CBC  WBC 3.4 - 10.8 x10E3/uL 8.1  6.9  7.7   Hemoglobin 11.1 - 15.9 g/dL 16.1  09.6  04.5   Hematocrit 34.0 - 46.6 % 36.3  37.3  38.1   Platelets 150 - 450 x10E3/uL 218  216        CMP     Component Value Date/Time   NA 142 05/06/2023 0905   NA 137 01/27/2014 0144   K 4.5 05/06/2023 0905   K 4.1 01/27/2014 0144   CL 104 05/06/2023 0905   CL 102 01/27/2014 0144   CO2 21 05/06/2023 0905   CO2 27 01/27/2014 0144   GLUCOSE 132 (H)  05/06/2023 0905   GLUCOSE 117 (H) 08/08/2021 1120   GLUCOSE 401 (H) 01/27/2014 0144   BUN 29 (H) 05/06/2023 0905   BUN 18 01/27/2014 0144   CREATININE 1.06 (H) 05/06/2023 0905   CREATININE 0.93 01/27/2014 0144   CALCIUM  9.2 05/06/2023 0905   CALCIUM  8.4 (L) 01/27/2014 0144   PROT 7.0 05/06/2023 0905   PROT 8.1 01/27/2014 0144   ALBUMIN 4.1 05/06/2023 0905   ALBUMIN 3.7 01/27/2014 0144   AST 19 05/06/2023 0905   AST 14 (L) 01/27/2014 0144   ALT 15 05/06/2023 0905   ALT 27 01/27/2014 0144   ALKPHOS 72 05/06/2023 0905   ALKPHOS 140 (H) 01/27/2014 0144   BILITOT 0.3 05/06/2023 0905   BILITOT 0.2 01/27/2014 0144   GFRNONAA >60 08/08/2021 1120   GFRNONAA >60 01/27/2014 0144   GFRAA >60 01/27/2014 0144     No results found.     Assessment & Plan:   1. Carotid artery stenosis, symptomatic, right Recommend:  Given the patient's asymptomatic subcritical stenosis no further invasive testing or surgery at this time.  Previous duplex was 60 to 79% on the left ICA.  Based on that we will continue to follow-up in 6 months.  Given this difference between studies on the patient follow-up in  6 months to repeat studies  Continue antiplatelet therapy as prescribed Continue management of CAD, HTN and Hyperlipidemia Healthy heart diet,  encouraged exercise at least 4 times per week Follow up in 6 months with duplex ultrasound and physical exam    2. Type 2 diabetes mellitus with other circulatory complication, unspecified whether long term insulin  use (HCC) Continue hypoglycemic medications as already ordered, these medications have been reviewed and there are no changes at this time.  Hgb A1C to be monitored as already arranged by primary service   3. Hypertension associated with diabetes (HCC) Continue antihypertensive medications as already ordered, these medications have been reviewed and there are no changes at this time.    Current Outpatient Medications on File Prior to Visit   Medication Sig Dispense Refill   ACCU-CHEK GUIDE test strip USE TO CHECK BLOOD SUGAR THREE TIMES A DAY 100 strip 6   aspirin  EC 81 MG EC tablet Take  1 tablet (81 mg total) by mouth daily. Swallow whole. 90 tablet 3   clopidogrel  (PLAVIX ) 75 MG tablet TAKE 1 TABLET BY MOUTH EVERY DAY 90 tablet 3   FARXIGA 10 MG TABS tablet TAKE 1 TABLET BY MOUTH EVERY DAY 90 tablet 3   glimepiride  (AMARYL ) 4 MG tablet TAKE 1 TABLET BY MOUTH TWICE A DAY 180 tablet 2   rosuvastatin  (CRESTOR ) 40 MG tablet TAKE 1 TABLET BY MOUTH EVERY DAY 90 tablet 3   acetaminophen  (TYLENOL ) 500 MG tablet Take 500 mg by mouth every 6 (six) hours as needed for moderate pain or mild pain. (Patient not taking: Reported on 02/01/2023)     Artificial Tear Solution (TEARS NATURALE OP) Place 1 drop into both eyes daily as needed (itchy eye). (Patient not taking: Reported on 02/01/2023)     baclofen  (LIORESAL ) 10 MG tablet Take 1 tablet (10 mg total) by mouth daily as needed for muscle spasms. (Patient not taking: Reported on 05/02/2023) 30 each 0   celecoxib  (CELEBREX ) 200 MG capsule TAKE 1 CAPSULE BY MOUTH EVERY DAY WITH FOOD (Patient not taking: Reported on 06/25/2023) 30 capsule 2   enalapril (VASOTEC) 2.5 MG tablet TAKE 1 TABLET BY MOUTH EVERY DAY (Patient not taking: Reported on 02/01/2023) 90 tablet 3   losartan  (COZAAR ) 100 MG tablet Take 1 tablet (100 mg total) by mouth daily. 30 tablet 11   Multiple Vitamin (MULTIVITAMIN) tablet Take 1 tablet by mouth daily.     Semaglutide  (RYBELSUS ) 3 MG TABS Take 1 tablet (3 mg total) by mouth daily. (Patient not taking: Reported on 10/12/2022) 30 tablet 0   Vitamin D , Ergocalciferol , (DRISDOL ) 1.25 MG (50000 UNIT) CAPS capsule TAKE 1 CAPSULE BY MOUTH ONE TIME PER WEEK 12 capsule 3   No current facility-administered medications on file prior to visit.    There are no Patient Instructions on file for this visit. No follow-ups on file.   Ronan Duecker E Oluwatobiloba Martin, NP

## 2023-08-01 ENCOUNTER — Ambulatory Visit: Admitting: Internal Medicine

## 2023-08-08 ENCOUNTER — Ambulatory Visit: Admitting: Internal Medicine

## 2023-08-27 ENCOUNTER — Other Ambulatory Visit: Payer: Self-pay | Admitting: Internal Medicine

## 2023-08-27 DIAGNOSIS — E1165 Type 2 diabetes mellitus with hyperglycemia: Secondary | ICD-10-CM

## 2023-09-06 ENCOUNTER — Ambulatory Visit: Payer: Self-pay | Admitting: Internal Medicine

## 2023-09-06 ENCOUNTER — Ambulatory Visit: Admitting: Internal Medicine

## 2023-09-06 ENCOUNTER — Encounter: Payer: Self-pay | Admitting: Internal Medicine

## 2023-09-06 VITALS — BP 136/68 | HR 86 | Ht 63.0 in | Wt 187.0 lb

## 2023-09-06 DIAGNOSIS — E1169 Type 2 diabetes mellitus with other specified complication: Secondary | ICD-10-CM

## 2023-09-06 DIAGNOSIS — E1165 Type 2 diabetes mellitus with hyperglycemia: Secondary | ICD-10-CM

## 2023-09-06 DIAGNOSIS — I152 Hypertension secondary to endocrine disorders: Secondary | ICD-10-CM

## 2023-09-06 DIAGNOSIS — G629 Polyneuropathy, unspecified: Secondary | ICD-10-CM | POA: Insufficient documentation

## 2023-09-06 DIAGNOSIS — E1159 Type 2 diabetes mellitus with other circulatory complications: Secondary | ICD-10-CM

## 2023-09-06 DIAGNOSIS — E782 Mixed hyperlipidemia: Secondary | ICD-10-CM

## 2023-09-06 LAB — POCT CBG (FASTING - GLUCOSE)-MANUAL ENTRY: Glucose Fasting, POC: 322 mg/dL — AB (ref 70–99)

## 2023-09-06 MED ORDER — GABAPENTIN 100 MG PO CAPS: 100.0000 mg | ORAL_CAPSULE | Freq: Two times a day (BID) | ORAL | 2 refills | Status: AC

## 2023-09-06 NOTE — Progress Notes (Signed)
 Established Patient Office Visit  Subjective:  Patient ID: Sharon Gray, female    DOB: 1965/03/29  Age: 58 y.o. MRN: [id number]  Chief Complaint  Patient presents with   Follow-up    3 month follow up    Patient comes in for a follow-up today.  Her fingerstick glucose is very high, admits to just eating her lunch and also took her medications about an hour ago.  Today she mentions of generalized body aches and pains, prevent her from getting a good night sleep.  She mentions she has chronic insomnia since she was a teenager and nothing has helped her in the past.  Agrees to start gabapentin for her chronic fibromyalgia/neuropathic pain, which will help her get some sleep as well. Patient will return fasting for her blood work. Denies headache or dizziness, no chest pain and no palpitations.    No other concerns at this time.   Past Medical History:  Diagnosis Date   Diabetes mellitus without complication (HCC)    Hypertension    Stroke [facility])     Past Surgical History:  Procedure Laterality Date   APPENDECTOMY     CAROTID PTA/STENT INTERVENTION Right 07/26/2020   Procedure: CAROTID PTA/STENT INTERVENTION;  Surgeon: Jama Cordella MATSU, MD;  Location: [facility] INVASIVE CV LAB;  Service: Cardiovascular;  Laterality: Right;   LOWER EXTREMITY ANGIOGRAPHY Right 05/19/2021   Procedure: Lower Extremity Angiography;  Surgeon: Jama Cordella MATSU, MD;  Location: [facility] INVASIVE CV LAB;  Service: Cardiovascular;  Laterality: Right;   NO PAST SURGERIES      Social History   Socioeconomic History   Marital status: Married    Spouse name: Energy manager   Number of children: 4   Years of education: Not on file   Highest education level: Not on file  Occupational History   Not on file  Tobacco Use   Smoking status: Never    Passive exposure: Never   Smokeless tobacco: Never  Substance and Sexual Activity   Alcohol use: Yes    Comment: socially: only at big parties (2 years ago)   Drug use: Not  Currently   Sexual activity: Not on file  Other Topics Concern   Not on file  Social History Narrative   Lives with husband and 3 kids at home    Social Drivers of Health   Financial Resource Strain: Not on file  Food Insecurity: Not on file  Transportation Needs: Not on file  Physical Activity: Not on file  Stress: Not on file  Social Connections: Not on file  Intimate Partner Violence: Not on file    Family History  Problem Relation Age of Onset   Stroke Father     Allergies  Allergen Reactions   Semaglutide     Outpatient Medications Prior to Visit  Medication Sig   ACCU-CHEK GUIDE TEST test strip USE TO CHECK BLOOD SUGAR THREE TIMES A DAY   aspirin EC 81 MG EC tablet Take 1 tablet (81 mg total) by mouth daily. Swallow whole.   clopidogrel (PLAVIX) 75 MG tablet TAKE 1 TABLET BY MOUTH EVERY DAY   dapagliflozin propanediol (FARXIGA) 10 MG TABS tablet TAKE 1 TABLET BY MOUTH EVERY DAY   glimepiride (AMARYL) 4 MG tablet TAKE 1 TABLET BY MOUTH TWICE A DAY   rosuvastatin (CRESTOR) 40 MG tablet TAKE 1 TABLET BY MOUTH EVERY DAY   RYBELSUS 3 MG TABS TAKE 1 TABLET BY MOUTH DAILY (PA DENIED)   acetaminophen (TYLENOL) 500 MG tablet Take 500  mg by mouth every 6 (six) hours as needed for moderate pain or mild pain. (Patient not taking: Reported on 02/01/2023)   Artificial Tear Solution (TEARS NATURALE OP) Place 1 drop into both eyes daily as needed (itchy eye). (Patient not taking: Reported on 02/01/2023)   baclofen (LIORESAL) 10 MG tablet Take 1 tablet (10 mg total) by mouth daily as needed for muscle spasms. (Patient not taking: Reported on 05/02/2023)   celecoxib (CELEBREX) 200 MG capsule TAKE 1 CAPSULE BY MOUTH EVERY DAY WITH FOOD (Patient not taking: Reported on 09/06/2023)   enalapril (VASOTEC) 2.5 MG tablet TAKE 1 TABLET BY MOUTH EVERY DAY (Patient not taking: Reported on 09/06/2023)   losartan (COZAAR) 100 MG tablet Take 1 tablet (100 mg total) by mouth daily.   Multiple Vitamin  (MULTIVITAMIN) tablet Take 1 tablet by mouth daily.   Vitamin D, Ergocalciferol, (DRISDOL) 1.25 MG (50000 UNIT) CAPS capsule TAKE 1 CAPSULE BY MOUTH ONE TIME PER WEEK   No facility-administered medications prior to visit.    Review of Systems  Constitutional: Negative.  Negative for chills, fever, malaise/fatigue and weight loss.  HENT: Negative.  Negative for sore throat.   Eyes: Negative.   Respiratory: Negative.  Negative for cough and shortness of breath.   Cardiovascular: Negative.  Negative for chest pain, palpitations and leg swelling.  Gastrointestinal: Negative.  Negative for abdominal pain, constipation, diarrhea, heartburn, nausea and vomiting.  Genitourinary: Negative.  Negative for dysuria and flank pain.  Musculoskeletal:  Positive for joint pain and myalgias.  Skin: Negative.   Neurological: Negative.  Negative for dizziness and headaches.  Endo/Heme/Allergies: Negative.   Psychiatric/Behavioral:  Negative for depression and suicidal ideas. The patient has insomnia. The patient is not nervous/anxious.        Objective:   BP 136/68   Pulse 86   Ht 5' 3 (1.6 m)   Wt 187 lb (84.8 kg)   SpO2 98%   BMI 33.13 kg/m   Vitals:   09/06/23 1403  BP: 136/68  Pulse: 86  Height: 5' 3 (1.6 m)  Weight: 187 lb (84.8 kg)  SpO2: 98%  BMI (Calculated): 33.13    Physical Exam Vitals and nursing note reviewed.  Constitutional:      Appearance: Normal appearance.  HENT:     Head: Normocephalic and atraumatic.     Nose: Nose normal.     Mouth/Throat:     Mouth: Mucous membranes are moist.     Pharynx: Oropharynx is clear.  Eyes:     Conjunctiva/sclera: Conjunctivae normal.     Pupils: Pupils are equal, round, and reactive to light.  Cardiovascular:     Rate and Rhythm: Normal rate and regular rhythm.     Pulses: Normal pulses.     Heart sounds: Normal heart sounds. No murmur heard. Pulmonary:     Effort: Pulmonary effort is normal.     Breath sounds: Normal  breath sounds. No wheezing.  Abdominal:     General: Bowel sounds are normal.     Palpations: Abdomen is soft.     Tenderness: There is no abdominal tenderness. There is no right CVA tenderness or left CVA tenderness.  Musculoskeletal:        General: Normal range of motion.     Cervical back: Normal range of motion.     Right lower leg: No edema.     Left lower leg: No edema.  Skin:    General: Skin is warm and dry.  Neurological:  General: No focal deficit present.     Mental Status: She is alert and oriented to person, place, and time.  Psychiatric:        Mood and Affect: Mood normal.        Behavior: Behavior normal.      Results for orders placed or performed in visit on 09/06/23  POCT CBG (Fasting - Glucose)  Result Value Ref Range   Glucose Fasting, POC 322 (A) 70 - 99 mg/dL    Recent Results (from the past 2160 hours)  POCT CBG (Fasting - Glucose)     Status: Abnormal   Collection Time: 09/06/23  2:11 PM  Result Value Ref Range   Glucose Fasting, POC 322 (A) 70 - 99 mg/dL      Assessment & Plan:  Continue current medications.  Start gabapentin.  Monitor blood sugars at home.  Return for fasting blood work. Problem List Items Addressed This Visit     Type 2 diabetes mellitus (HCC)   Relevant Orders   POCT CBG (Fasting - Glucose) (Completed)   Poorly controlled diabetes mellitus (HCC)   Relevant Orders   Hemoglobin A1c   Hypertension associated with diabetes (HCC) - Primary   Relevant Orders   CMP14+EGFR   Combined hyperlipidemia associated with type 2 diabetes mellitus (HCC)   Relevant Orders   Lipid Panel w/o Chol/HDL Ratio   Neuropathy   Relevant Medications   gabapentin (NEURONTIN) 100 MG capsule    Return in about 1 month (around 10/07/2023).   Total time spent: 30 minutes  FERNAND FREDY RAMAN, MD  09/06/2023   This document may have been prepared by [facility] Voice Recognition software and as such may include unintentional dictation errors.

## 2023-09-30 ENCOUNTER — Ambulatory Visit (INDEPENDENT_AMBULATORY_CARE_PROVIDER_SITE_OTHER): Admitting: Internal Medicine

## 2023-09-30 ENCOUNTER — Encounter: Payer: Self-pay | Admitting: Internal Medicine

## 2023-09-30 ENCOUNTER — Ambulatory Visit (INDEPENDENT_AMBULATORY_CARE_PROVIDER_SITE_OTHER): Payer: Self-pay | Admitting: Internal Medicine

## 2023-09-30 DIAGNOSIS — R300 Dysuria: Secondary | ICD-10-CM | POA: Diagnosis not present

## 2023-09-30 LAB — POCT URINALYSIS DIPSTICK
Bilirubin, UA: NEGATIVE
Glucose, UA: POSITIVE — AB
Ketones, UA: NEGATIVE
Nitrite, UA: NEGATIVE
Protein, UA: POSITIVE — AB
Spec Grav, UA: 1.015 (ref 1.010–1.025)
Urobilinogen, UA: 0.2 U/dL
pH, UA: 5.5 (ref 5.0–8.0)

## 2023-09-30 MED ORDER — CIPROFLOXACIN HCL 250 MG PO TABS: 250.0000 mg | ORAL_TABLET | Freq: Two times a day (BID) | ORAL | 0 refills | Status: DC

## 2023-09-30 NOTE — Progress Notes (Signed)
 Patient came in today with complaints of Dysuria. Her urine was positive for leukocytes. Urine CNS ordered. ABX sent to her pharmacy.

## 2023-10-01 ENCOUNTER — Other Ambulatory Visit: Payer: Self-pay | Admitting: Internal Medicine

## 2023-10-01 ENCOUNTER — Other Ambulatory Visit: Payer: Self-pay

## 2023-10-01 DIAGNOSIS — R300 Dysuria: Secondary | ICD-10-CM

## 2023-10-01 MED ORDER — CIPROFLOXACIN HCL 250 MG PO TABS: 250.0000 mg | ORAL_TABLET | Freq: Two times a day (BID) | ORAL | 0 refills | Status: AC

## 2023-10-01 NOTE — Progress Notes (Signed)
 Cipro  ordered yesterday had inaccurate days verses quantity. Fixed Cipro  prescription to say for 7 days, 14 tablets.

## 2023-10-01 NOTE — Progress Notes (Signed)
 Patient notified

## 2023-10-04 LAB — URINE CULTURE

## 2023-10-07 NOTE — Progress Notes (Signed)
 Patient notified

## 2023-10-21 ENCOUNTER — Ambulatory Visit: Admitting: Internal Medicine

## 2023-11-01 ENCOUNTER — Ambulatory Visit: Payer: Self-pay | Admitting: Internal Medicine

## 2023-11-01 ENCOUNTER — Encounter: Payer: Self-pay | Admitting: Internal Medicine

## 2023-11-01 ENCOUNTER — Ambulatory Visit: Admitting: Internal Medicine

## 2023-11-01 VITALS — BP 132/70 | HR 80 | Ht 63.0 in | Wt 188.0 lb

## 2023-11-01 DIAGNOSIS — E1169 Type 2 diabetes mellitus with other specified complication: Secondary | ICD-10-CM

## 2023-11-01 DIAGNOSIS — I152 Hypertension secondary to endocrine disorders: Secondary | ICD-10-CM

## 2023-11-01 DIAGNOSIS — E1165 Type 2 diabetes mellitus with hyperglycemia: Secondary | ICD-10-CM

## 2023-11-01 DIAGNOSIS — E1159 Type 2 diabetes mellitus with other circulatory complications: Secondary | ICD-10-CM

## 2023-11-01 DIAGNOSIS — G629 Polyneuropathy, unspecified: Secondary | ICD-10-CM

## 2023-11-01 DIAGNOSIS — Z8673 Personal history of transient ischemic attack (TIA), and cerebral infarction without residual deficits: Secondary | ICD-10-CM

## 2023-11-01 DIAGNOSIS — E782 Mixed hyperlipidemia: Secondary | ICD-10-CM | POA: Diagnosis not present

## 2023-11-01 LAB — POCT CBG (FASTING - GLUCOSE)-MANUAL ENTRY: Glucose Fasting, POC: 197 mg/dL — AB (ref 70–99)

## 2023-11-01 LAB — POC CREATINE & ALBUMIN,URINE
Creatinine, POC: 100 mg/dL
Microalbumin Ur, POC: 80 mg/L

## 2023-11-01 NOTE — Progress Notes (Signed)
 Established Patient Office Visit  Subjective:  Patient ID: Sharon Gray, female    DOB: [birth year]  Age: 58 y.o. MRN: [id number]  Chief Complaint  Patient presents with   Follow-up    1 month follow up    Patient comes in for follow-up today.  She says that she is feeling very well and has no complaints whatsoever.  Patient did not return for fasting labs however we will check it today.  She also declines a flu shot. Denies chest pain, no shortness of breath no palpitations.  Taking all her medications as prescribed.    No other concerns at this time.   Past Medical History:  Diagnosis Date   Diabetes mellitus without complication (HCC)    Hypertension    Stroke [facility])     Past Surgical History:  Procedure Laterality Date   APPENDECTOMY     CAROTID PTA/STENT INTERVENTION Right 07/26/2020   Procedure: CAROTID PTA/STENT INTERVENTION;  Surgeon: Jama Cordella MATSU, MD;  Location: [facility] INVASIVE CV LAB;  Service: Cardiovascular;  Laterality: Right;   LOWER EXTREMITY ANGIOGRAPHY Right 05/19/2021   Procedure: Lower Extremity Angiography;  Surgeon: Jama Cordella MATSU, MD;  Location: [facility] INVASIVE CV LAB;  Service: Cardiovascular;  Laterality: Right;   NO PAST SURGERIES      Social History   Socioeconomic History   Marital status: Married    Spouse name: Energy manager   Number of children: 4   Years of education: Not on file   Highest education level: Not on file  Occupational History   Not on file  Tobacco Use   Smoking status: Never    Passive exposure: Never   Smokeless tobacco: Never  Substance and Sexual Activity   Alcohol use: Yes    Comment: socially: only at big parties (2 years ago)   Drug use: Not Currently   Sexual activity: Not on file  Other Topics Concern   Not on file  Social History Narrative   Lives with husband and 3 kids at home    Social Drivers of Health   Financial Resource Strain: Not on file  Food Insecurity: Not on file  Transportation Needs: Not on  file  Physical Activity: Not on file  Stress: Not on file  Social Connections: Not on file  Intimate Partner Violence: Not on file    Family History  Problem Relation Age of Onset   Stroke Father     Allergies  Allergen Reactions   Semaglutide      Outpatient Medications Prior to Visit  Medication Sig   ACCU-CHEK GUIDE TEST test strip USE TO CHECK BLOOD SUGAR THREE TIMES A DAY   aspirin  EC 81 MG EC tablet Take 1 tablet (81 mg total) by mouth daily. Swallow whole.   clopidogrel  (PLAVIX ) 75 MG tablet TAKE 1 TABLET BY MOUTH EVERY DAY   dapagliflozin propanediol (FARXIGA) 10 MG TABS tablet TAKE 1 TABLET BY MOUTH EVERY DAY   gabapentin  (NEURONTIN ) 100 MG capsule Take 1 capsule (100 mg total) by mouth 2 (two) times daily.   glimepiride  (AMARYL ) 4 MG tablet TAKE 1 TABLET BY MOUTH TWICE A DAY   losartan  (COZAAR ) 100 MG tablet Take 1 tablet (100 mg total) by mouth daily.   Multiple Vitamin (MULTIVITAMIN) tablet Take 1 tablet by mouth daily.   rosuvastatin  (CRESTOR ) 40 MG tablet TAKE 1 TABLET BY MOUTH EVERY DAY   RYBELSUS  3 MG TABS TAKE 1 TABLET BY MOUTH DAILY (PA DENIED)   Vitamin D , Ergocalciferol , (DRISDOL ) 1.25 MG (50000  UNIT) CAPS capsule TAKE 1 CAPSULE BY MOUTH ONE TIME PER WEEK   acetaminophen  (TYLENOL ) 500 MG tablet Take 500 mg by mouth every 6 (six) hours as needed for moderate pain or mild pain. (Patient not taking: Reported on 11/01/2023)   Artificial Tear Solution (TEARS NATURALE OP) Place 1 drop into both eyes daily as needed (itchy eye). (Patient not taking: Reported on 11/01/2023)   baclofen  (LIORESAL ) 10 MG tablet Take 1 tablet (10 mg total) by mouth daily as needed for muscle spasms. (Patient not taking: Reported on 11/01/2023)   celecoxib  (CELEBREX ) 200 MG capsule TAKE 1 CAPSULE BY MOUTH EVERY DAY WITH FOOD (Patient not taking: Reported on 11/01/2023)   enalapril (VASOTEC) 2.5 MG tablet TAKE 1 TABLET BY MOUTH EVERY DAY (Patient not taking: Reported on 11/01/2023)   No  facility-administered medications prior to visit.    Review of Systems  Constitutional: Negative.  Negative for chills, fever and malaise/fatigue.  HENT: Negative.  Negative for congestion and sore throat.   Eyes: Negative.  Negative for blurred vision and pain.  Respiratory: Negative.  Negative for cough and shortness of breath.   Cardiovascular: Negative.  Negative for chest pain, palpitations and leg swelling.  Gastrointestinal: Negative.  Negative for abdominal pain, blood in stool, constipation, diarrhea, heartburn, melena, nausea and vomiting.  Genitourinary: Negative.  Negative for dysuria, flank pain, frequency and urgency.  Musculoskeletal: Negative.  Negative for joint pain and myalgias.  Skin: Negative.   Neurological: Negative.  Negative for dizziness, tingling, sensory change, weakness and headaches.  Endo/Heme/Allergies: Negative.   Psychiatric/Behavioral: Negative.  Negative for depression and suicidal ideas. The patient is not nervous/anxious.        Objective:   BP 132/70   Pulse 80   Ht 5' 3 (1.6 m)   Wt 188 lb (85.3 kg)   SpO2 97%   BMI 33.30 kg/m   Vitals:   11/01/23 1447  BP: 132/70  Pulse: 80  Height: 5' 3 (1.6 m)  Weight: 188 lb (85.3 kg)  SpO2: 97%  BMI (Calculated): 33.31    Physical Exam Vitals and nursing note reviewed.  Constitutional:      Appearance: Normal appearance.  HENT:     Head: Normocephalic and atraumatic.     Nose: Nose normal.     Mouth/Throat:     Mouth: Mucous membranes are moist.     Pharynx: Oropharynx is clear.  Eyes:     Conjunctiva/sclera: Conjunctivae normal.     Pupils: Pupils are equal, round, and reactive to light.  Cardiovascular:     Rate and Rhythm: Normal rate and regular rhythm.     Pulses: Normal pulses.     Heart sounds: Normal heart sounds. No murmur heard. Pulmonary:     Effort: Pulmonary effort is normal.     Breath sounds: Normal breath sounds. No wheezing.  Abdominal:     General: Bowel  sounds are normal.     Palpations: Abdomen is soft.     Tenderness: There is no abdominal tenderness. There is no right CVA tenderness or left CVA tenderness.  Musculoskeletal:        General: Normal range of motion.     Cervical back: Normal range of motion.     Right lower leg: No edema.     Left lower leg: No edema.  Skin:    General: Skin is warm and dry.  Neurological:     General: No focal deficit present.     Mental Status: She is  alert and oriented to person, place, and time.  Psychiatric:        Mood and Affect: Mood normal.        Behavior: Behavior normal.      Results for orders placed or performed in visit on 11/01/23  POCT CBG (Fasting - Glucose)  Result Value Ref Range   Glucose Fasting, POC 197 (A) 70 - 99 mg/dL  POC CREATINE & ALBUMIN,URINE  Result Value Ref Range   Microalbumin Ur, POC 80 mg/L   Creatinine, POC 100 mg/dL   Albumin/Creatinine Ratio, Urine, POC 30-300     Recent Results (from the past 2160 hours)  POCT CBG (Fasting - Glucose)     Status: Abnormal   Collection Time: 09/06/23  2:11 PM  Result Value Ref Range   Glucose Fasting, POC 322 (A) 70 - 99 mg/dL  POCT Urinalysis Dipstick (18997)     Status: Abnormal   Collection Time: 09/30/23  1:16 PM  Result Value Ref Range   Color, UA Light yellow    Clarity, UA Cloudy    Glucose, UA Positive (A) Negative   Bilirubin, UA Negative    Ketones, UA Negative    Spec Grav, UA 1.015 1.010 - 1.025   Blood, UA Moderate    pH, UA 5.5 5.0 - 8.0   Protein, UA Positive (A) Negative   Urobilinogen, UA 0.2 0.2 or 1.0 E.U./dL   Nitrite, UA Negative    Leukocytes, UA Small (1+) (A) Negative   Appearance Cloudy    Odor Yes   Urine Culture     Status: Abnormal   Collection Time: 09/30/23  3:46 PM   Specimen: Urine   UR  Result Value Ref Range   Urine Culture, Routine Final report (A)    Organism ID, Bacteria Escherichia coli (A)     Comment: Cefazolin  with an MIC <=16 predicts susceptibility to the  oral agents cefaclor, cefdinir, cefpodoxime, cefprozil, cefuroxime, cephalexin, and loracarbef when used for therapy of uncomplicated urinary tract infections due to E. coli, Klebsiella pneumoniae, and Proteus mirabilis. Greater than 100,000 colony forming units per mL    Antimicrobial Susceptibility Comment     Comment:       ** S = Susceptible; I = Intermediate; R = Resistant **                    P = Positive; N = Negative             MICS are expressed in micrograms per mL    Antibiotic                 RSLT#1    RSLT#2    RSLT#3    RSLT#4 Amoxicillin/Clavulanic Acid    S Ampicillin                     R Cefazolin                       S Cefepime                       S Cefoxitin                      S Cefpodoxime                    S Ceftriaxone   S Ciprofloxacin                   S Ertapenem                      S Gentamicin                     S Levofloxacin                   S Meropenem                      S Nitrofurantoin                  S Piperacillin/Tazobactam        S Tetracycline                   S Tobramycin                     S Trimethoprim /Sulfa              S   POCT CBG (Fasting - Glucose)     Status: Abnormal   Collection Time: 11/01/23  2:52 PM  Result Value Ref Range   Glucose Fasting, POC 197 (A) 70 - 99 mg/dL  POC CREATINE & ALBUMIN,URINE     Status: None   Collection Time: 11/01/23  4:42 PM  Result Value Ref Range   Microalbumin Ur, POC 80 mg/L   Creatinine, POC 100 mg/dL   Albumin/Creatinine Ratio, Urine, POC 30-300       Assessment & Plan:  Continue current medications.  Check blood work today.  Monitor blood pressure and blood sugars. Problem List Items Addressed This Visit     Type 2 diabetes mellitus (HCC) - Primary   Relevant Orders   POCT CBG (Fasting - Glucose) (Completed)   POC CREATINE & ALBUMIN,URINE (Completed)   History of CVA (cerebrovascular accident)   Poorly controlled diabetes mellitus (HCC)   Hypertension  associated with diabetes (HCC)   Combined hyperlipidemia associated with type 2 diabetes mellitus (HCC)   Neuropathy    Return in about 3 months (around 02/01/2024).   Total time spent: 30 minutes  FERNAND FREDY RAMAN, MD  11/01/2023   This document may have been prepared by [facility] Voice Recognition software and as such may include unintentional dictation errors.

## 2023-11-05 LAB — CMP14+EGFR
ALT: 20 IU/L (ref 0–32)
AST: 21 IU/L (ref 0–40)
Albumin: 4 g/dL (ref 3.8–4.9)
Alkaline Phosphatase: 99 IU/L (ref 49–135)
BUN/Creatinine Ratio: 35 — ABNORMAL HIGH (ref 9–23)
BUN: 36 mg/dL — ABNORMAL HIGH (ref 6–24)
Bilirubin Total: 0.3 mg/dL (ref 0.0–1.2)
CO2: 23 mmol/L (ref 20–29)
Calcium: 8.7 mg/dL (ref 8.7–10.2)
Chloride: 108 mmol/L — ABNORMAL HIGH (ref 96–106)
Creatinine, Ser: 1.02 mg/dL — ABNORMAL HIGH (ref 0.57–1.00)
Globulin, Total: 2.9 g/dL (ref 1.5–4.5)
Glucose: 154 mg/dL — ABNORMAL HIGH (ref 70–99)
Potassium: 4.5 mmol/L (ref 3.5–5.2)
Sodium: 141 mmol/L (ref 134–144)
Total Protein: 6.9 g/dL (ref 6.0–8.5)
eGFR: 65 mL/min/1.73 (ref >59)

## 2023-11-05 LAB — LIPID PANEL W/O CHOL/HDL RATIO
Cholesterol, Total: 110 mg/dL (ref 100–199)
HDL: 36 mg/dL — ABNORMAL LOW (ref >39)
LDL Chol Calc (NIH): 46 mg/dL (ref 0–99)
Triglycerides: 168 mg/dL — ABNORMAL HIGH (ref 0–149)
VLDL Cholesterol Cal: 28 mg/dL (ref 5–40)

## 2023-11-05 LAB — HEMOGLOBIN A1C
Est. average glucose Bld gHb Est-mCnc: 278 mg/dL
Hgb A1c MFr Bld: 11.3 % — ABNORMAL HIGH (ref 4.8–5.6)

## 2023-11-11 NOTE — Progress Notes (Signed)
Left vm for pt to call back to make appt.

## 2023-11-23 ENCOUNTER — Other Ambulatory Visit: Payer: Self-pay | Admitting: Cardiology

## 2023-12-18 ENCOUNTER — Other Ambulatory Visit (INDEPENDENT_AMBULATORY_CARE_PROVIDER_SITE_OTHER): Payer: Self-pay | Admitting: Nurse Practitioner

## 2023-12-18 DIAGNOSIS — I6523 Occlusion and stenosis of bilateral carotid arteries: Secondary | ICD-10-CM

## 2023-12-25 ENCOUNTER — Ambulatory Visit (INDEPENDENT_AMBULATORY_CARE_PROVIDER_SITE_OTHER)

## 2023-12-25 ENCOUNTER — Encounter (INDEPENDENT_AMBULATORY_CARE_PROVIDER_SITE_OTHER): Payer: Self-pay | Admitting: Nurse Practitioner

## 2023-12-25 ENCOUNTER — Ambulatory Visit (INDEPENDENT_AMBULATORY_CARE_PROVIDER_SITE_OTHER): Admitting: Nurse Practitioner

## 2023-12-25 ENCOUNTER — Other Ambulatory Visit: Payer: Self-pay | Admitting: Cardiology

## 2023-12-25 VITALS — BP 121/75 | HR 80 | Resp 18 | Ht 63.0 in | Wt 192.4 lb

## 2023-12-25 DIAGNOSIS — I6523 Occlusion and stenosis of bilateral carotid arteries: Secondary | ICD-10-CM

## 2023-12-25 DIAGNOSIS — E1159 Type 2 diabetes mellitus with other circulatory complications: Secondary | ICD-10-CM | POA: Diagnosis not present

## 2023-12-25 DIAGNOSIS — I152 Hypertension secondary to endocrine disorders: Secondary | ICD-10-CM | POA: Diagnosis not present

## 2023-12-25 DIAGNOSIS — I8393 Asymptomatic varicose veins of bilateral lower extremities: Secondary | ICD-10-CM

## 2023-12-29 ENCOUNTER — Encounter (INDEPENDENT_AMBULATORY_CARE_PROVIDER_SITE_OTHER): Payer: Self-pay | Admitting: Nurse Practitioner

## 2023-12-29 NOTE — Progress Notes (Signed)
 Subjective:    Patient ID: Sharon Gray, female    DOB: Jun 21, 1965, 58 y.o.   MRN: [id number] Chief Complaint  Patient presents with   Follow-up    6 months + Carotid    HPI  Discussed the use of AI scribe software for clinical note transcription with the patient, who gave verbal consent to proceed.  History of Present Illness Sharon Gray is a 58 year old female who presents for a routine follow-up after vascular stenting.  She has not experienced significant changes in her health since the last visit. Recent ultrasound results show the right side stent remains patent and the left side stenosis has decreased from 40-59% to 1-39%.  She experiences occasional numbness in her legs and intermittent dark discoloration around her ankles, resembling a 'sock'. Her veins appear slightly puffy but are not bothersome.  Her daily activities include cleaning, and she does not work outside the home. She plans to travel in January.    Results RADIOLOGY Ultrasound: Right side stent patent, no stenosis. Left side stenosis reduced from 40-59% to 1-39%.   Review of Systems  Cardiovascular:  Positive for leg swelling.  All other systems reviewed and are negative.      Objective:   Physical Exam Vitals reviewed.  HENT:     Head: Normocephalic.  Cardiovascular:     Rate and Rhythm: Normal rate.     Pulses: Normal pulses.  Pulmonary:     Effort: Pulmonary effort is normal.  Skin:    General: Skin is warm and dry.  Neurological:     Mental Status: She is alert and oriented to person, place, and time.  Psychiatric:        Mood and Affect: Mood normal.        Behavior: Behavior normal.        Thought Content: Thought content normal.        Judgment: Judgment normal.     Physical Exam   BP 121/75   Pulse 80   Resp 18   Ht 5' 3 (1.6 m)   Wt 192 lb 6.4 oz (87.3 kg)   BMI 34.08 kg/m   Past Medical History:  Diagnosis Date   Diabetes mellitus without complication (HCC)     Hypertension    Stroke [facility])     Social History   Socioeconomic History   Marital status: Married    Spouse name: Energy Manager   Number of children: 4   Years of education: Not on file   Highest education level: Not on file  Occupational History   Not on file  Tobacco Use   Smoking status: Never    Passive exposure: Never   Smokeless tobacco: Never  Substance and Sexual Activity   Alcohol use: Yes    Comment: socially: only at big parties (2 years ago)   Drug use: Not Currently   Sexual activity: Not on file  Other Topics Concern   Not on file  Social History Narrative   Lives with husband and 3 kids at home    Social Drivers of Health   Financial Resource Strain: Not on file  Food Insecurity: Not on file  Transportation Needs: Not on file  Physical Activity: Not on file  Stress: Not on file  Social Connections: Not on file  Intimate Partner Violence: Not on file    Past Surgical History:  Procedure Laterality Date   APPENDECTOMY     CAROTID PTA/STENT INTERVENTION Right 07/26/2020   Procedure: CAROTID PTA/STENT  INTERVENTION;  Surgeon: Jama Cordella MATSU, MD;  Location: [facility] INVASIVE CV LAB;  Service: Cardiovascular;  Laterality: Right;   LOWER EXTREMITY ANGIOGRAPHY Right 05/19/2021   Procedure: Lower Extremity Angiography;  Surgeon: Jama Cordella MATSU, MD;  Location: [facility] INVASIVE CV LAB;  Service: Cardiovascular;  Laterality: Right;   NO PAST SURGERIES      Family History  Problem Relation Age of Onset   Stroke Father     Allergies  Allergen Reactions   Semaglutide         Latest Ref Rng & Units 05/06/2023    9:05 AM 08/28/2022    1:41 PM 05/29/2022    8:57 AM  CBC  WBC 3.4 - 10.8 x10E3/uL 8.1  6.9  7.7   Hemoglobin 11.1 - 15.9 g/dL 87.8  87.7  87.4   Hematocrit 34.0 - 46.6 % 36.3  37.3  38.1   Platelets 150 - 450 x10E3/uL 218  216        CMP     Component Value Date/Time   NA 141 11/04/2023 0921   NA 137 01/27/2014 0144   K 4.5 11/04/2023 0921   K 4.1  01/27/2014 0144   CL 108 (H) 11/04/2023 0921   CL 102 01/27/2014 0144   CO2 23 11/04/2023 0921   CO2 27 01/27/2014 0144   GLUCOSE 154 (H) 11/04/2023 0921   GLUCOSE 117 (H) 08/08/2021 1120   GLUCOSE 401 (H) 01/27/2014 0144   BUN 36 (H) 11/04/2023 0921   BUN 18 01/27/2014 0144   CREATININE 1.02 (H) 11/04/2023 0921   CREATININE 0.93 01/27/2014 0144   CALCIUM  8.7 11/04/2023 0921   CALCIUM  8.4 (L) 01/27/2014 0144   PROT 6.9 11/04/2023 0921   PROT 8.1 01/27/2014 0144   ALBUMIN 4.0 11/04/2023 0921   ALBUMIN 3.7 01/27/2014 0144   AST 21 11/04/2023 0921   AST 14 (L) 01/27/2014 0144   ALT 20 11/04/2023 0921   ALT 27 01/27/2014 0144   ALKPHOS 99 11/04/2023 0921   ALKPHOS 140 (H) 01/27/2014 0144   BILITOT 0.3 11/04/2023 0921   BILITOT 0.2 01/27/2014 0144   EGFR 65 11/04/2023 0921   GFRNONAA >60 08/08/2021 1120   GFRNONAA >60 01/27/2014 0144     No results found.     Assessment & Plan:   1. Bilateral carotid artery stenosis (Primary) Carotid artery stenosis, right, status post stent placement Right carotid artery stent patent with no significant stenosis. Left carotid artery stenosis improved from 40-59% to 1-39%. Condition well-managed. - Scheduled follow-up in one year unless symptoms change.  2. Type 2 diabetes mellitus with other circulatory complication, unspecified whether long term insulin  use (HCC) Continue hypoglycemic medications as already ordered, these medications have been reviewed and there are no changes at this time.  Hgb A1C to be monitored as already arranged by primary service  3. Hypertension associated with diabetes (HCC) Continue antihypertensive medications as already ordered, these medications have been reviewed and there are no changes at this time.    4. Asymptomatic varicose veins of both lower extremities Lower extremity varicose veins Intermittent discoloration and puffiness likely due to varicose veins. Symptoms not bothersome. - Recommended  wearing 15-20 mmHg knee-high compression socks during the day, especially when active or traveling. - Advised elevating feet when not active.   Current Outpatient Medications on File Prior to Visit  Medication Sig Dispense Refill   ACCU-CHEK GUIDE TEST test strip USE TO CHECK BLOOD SUGAR THREE TIMES A DAY 100 strip 6   aspirin  EC 81  MG EC tablet Take 1 tablet (81 mg total) by mouth daily. Swallow whole. 90 tablet 3   clopidogrel  (PLAVIX ) 75 MG tablet TAKE 1 TABLET BY MOUTH EVERY DAY 90 tablet 3   dapagliflozin propanediol (FARXIGA) 10 MG TABS tablet TAKE 1 TABLET BY MOUTH EVERY DAY 90 tablet 0   losartan  (COZAAR ) 100 MG tablet Take 1 tablet (100 mg total) by mouth daily. 30 tablet 11   rosuvastatin  (CRESTOR ) 40 MG tablet TAKE 1 TABLET BY MOUTH EVERY DAY 90 tablet 0   acetaminophen  (TYLENOL ) 500 MG tablet Take 500 mg by mouth every 6 (six) hours as needed for moderate pain or mild pain. (Patient not taking: Reported on 11/01/2023)     Artificial Tear Solution (TEARS NATURALE OP) Place 1 drop into both eyes daily as needed (itchy eye). (Patient not taking: Reported on 11/01/2023)     baclofen  (LIORESAL ) 10 MG tablet Take 1 tablet (10 mg total) by mouth daily as needed for muscle spasms. (Patient not taking: Reported on 11/01/2023) 30 each 0   celecoxib  (CELEBREX ) 200 MG capsule TAKE 1 CAPSULE BY MOUTH EVERY DAY WITH FOOD (Patient not taking: Reported on 11/01/2023) 30 capsule 2   enalapril (VASOTEC) 2.5 MG tablet TAKE 1 TABLET BY MOUTH EVERY DAY (Patient not taking: Reported on 11/01/2023) 90 tablet 3   gabapentin  (NEURONTIN ) 100 MG capsule Take 1 capsule (100 mg total) by mouth 2 (two) times daily. (Patient not taking: Reported on 12/25/2023) 60 capsule 2   Multiple Vitamin (MULTIVITAMIN) tablet Take 1 tablet by mouth daily.     RYBELSUS  3 MG TABS TAKE 1 TABLET BY MOUTH DAILY (PA DENIED) 30 tablet 0   Vitamin D , Ergocalciferol , (DRISDOL ) 1.25 MG (50000 UNIT) CAPS capsule TAKE 1 CAPSULE BY MOUTH ONE  TIME PER WEEK (Patient not taking: Reported on 12/25/2023) 12 capsule 3   No current facility-administered medications on file prior to visit.    There are no Patient Instructions on file for this visit. Return in about 1 year (around 12/24/2024) for 1 year Carotid duplex to see JD/FB.   Doriann Zuch E Espyn Radwan, NP

## 2024-01-30 ENCOUNTER — Other Ambulatory Visit: Payer: Self-pay

## 2024-01-30 MED ORDER — DAPAGLIFLOZIN PROPANEDIOL 10 MG PO TABS: 10.0000 mg | ORAL_TABLET | Freq: Every day | ORAL | 3 refills | Status: AC

## 2024-01-31 ENCOUNTER — Other Ambulatory Visit: Payer: Self-pay | Admitting: Internal Medicine

## 2024-01-31 DIAGNOSIS — R300 Dysuria: Secondary | ICD-10-CM

## 2024-02-03 ENCOUNTER — Ambulatory Visit: Admitting: Internal Medicine

## 2024-02-04 ENCOUNTER — Ambulatory Visit: Admitting: Internal Medicine

## 2024-02-17 ENCOUNTER — Ambulatory Visit: Admitting: Internal Medicine

## 2024-02-21 ENCOUNTER — Other Ambulatory Visit: Payer: Self-pay | Admitting: Internal Medicine

## 2024-03-16 ENCOUNTER — Ambulatory Visit: Admitting: Internal Medicine

## 2024-12-24 ENCOUNTER — Ambulatory Visit (INDEPENDENT_AMBULATORY_CARE_PROVIDER_SITE_OTHER): Admitting: Nurse Practitioner

## 2024-12-24 ENCOUNTER — Encounter (INDEPENDENT_AMBULATORY_CARE_PROVIDER_SITE_OTHER)
